# Patient Record
Sex: Male | Born: 1975 | Race: Black or African American | Hispanic: No | Smoking: Never smoker
Health system: Southern US, Community
[De-identification: ages and names within clinical notes are randomized; demographics above are authoritative.]

## PROBLEM LIST (undated history)

## (undated) DIAGNOSIS — I10 Essential (primary) hypertension: Secondary | ICD-10-CM

## (undated) DIAGNOSIS — E119 Type 2 diabetes mellitus without complications: Secondary | ICD-10-CM

## (undated) DIAGNOSIS — F319 Bipolar disorder, unspecified: Secondary | ICD-10-CM

## (undated) HISTORY — PX: KNEE SURGERY: SHX244

## (undated) MED FILL — QUETIAPINE 200 MG TABLET: 200 200 MG | ORAL | 30 days supply | Qty: 30 | Fill #0

## (undated) MED FILL — LAMOTRIGINE 25 MG TABLET: 25 25 MG | ORAL | 14 days supply | Qty: 14 | Fill #0

## (undated) MED FILL — ARIPIPRAZOLE ER 400 MG INTRAMUSCULAR SUSPENSION,EXTENDED RELEASE: 400 400 mg | INTRAMUSCULAR | 30 days supply | Qty: 1 | Fill #0

## (undated) MED FILL — QUETIAPINE 400 MG TABLET: 400 400 MG | ORAL | 30 days supply | Qty: 30 | Fill #0

## (undated) NOTE — Telephone Encounter (Signed)
 Formatting of this note might be different from the original. Information has been submitted to Cover my meds, await response  Per Cover my meds available with out PA.  Please send new rx to [facility] in [place] [address] [facility] Electronically signed by Jilda Sor, LPN at 88/83/7979  9:30 AM EST

## (undated) NOTE — Telephone Encounter (Signed)
 Formatting of this note might be different from the original. Pt called stating his pharmacy informed him methoxsalen (OXSORALEN ULTRA) 10 MG CAPS is not formulary. Pt questions if a new medication can be prescribed.   [phone] (home) [phone] [facility])   [facility] - [place], [place] - [address]  [phone]  Electronically signed by Marea Browning, LPN at 88/86/7979  3:24 PM EST

## (undated) NOTE — Telephone Encounter (Signed)
 Formatting of this note might be different from the original. [phone] (home) [phone] [facility])   Spoke to patient advised did not need a PA, stated he was going to call his pharmacy to find out how much it was and call back. Electronically signed by Jilda Sor, LPN at 88/81/7979  9:00 AM EST

## (undated) NOTE — Addendum Note (Signed)
 Addended by: STUART PROMISE on: 11/27/2018 05:29 PM   Modules accepted: Orders   Electronically signed by STUART PROMISE SAUNDERS., MD at 11/27/2018  5:29 PM EST

## (undated) NOTE — Telephone Encounter (Signed)
 Formatting of this note might be different from the original. Attempted to contact patient, has VM but is full, will try again. Electronically signed by Jilda Sor, LPN at 88/83/7979  4:42 PM EST

## (undated) NOTE — Telephone Encounter (Signed)
 Formatting of this note might be different from the original. Skin Type: V- Brown Skin (Asians, Hispanics, Middle Easterns, light skinned African Americans)  Diagnosis: CTCL  Phototherapy Type: NBUVB/box  Frequency: 3x/week  Specific instructions: Start Increase 154mJ/treament Max dose: 2500mJ  Mineral Oil: No Sunscreen: no  Special Instructions: None Boost Dose: None     Electronically signed by Stuart Connor SAUNDERS., MD at 11/27/2018  5:29 PM EST

## (undated) NOTE — Telephone Encounter (Signed)
 Formatting of this note might be different from the original. Can we try to do PA on this?  He needs to take this in addition to his UVA treatment Electronically signed by Melonie Therisa HERO., CNP at 11/19/2018  9:07 AM EST

## (undated) NOTE — Telephone Encounter (Signed)
 Formatting of this note might be different from the original. [phone] (home) [phone] [facility])   Spoke to patient he is agreable to the UVB light box,  Scheduled for Monday 11/30, please place orders  Release of records form was filled out and sent to medical records for them to get previous records.  Electronically signed by Jilda Sor, LPN at 88/76/7979  1:59 PM EST

## (undated) NOTE — Telephone Encounter (Signed)
 Formatting of this note might be different from the original. [place]. This sounds too expensive to attempt long -term. We should set him up for UVB instead of PUVA then. This means treatments preferably three times a week instead of two. Did we ever get records from his previous providers? That is still needed Electronically signed by Stuart Connor SAUNDERS., MD at 11/26/2018 12:35 PM EST

## (undated) NOTE — Telephone Encounter (Signed)
 Formatting of this note might be different from the original. Patient calling stating that the medication is $271 for a four day supply. This is the price with his insurance. Please advise Electronically signed by Jolee Fruits, LPN at 88/81/7979  9:22 AM EST

---

## 1997-10-23 ENCOUNTER — Encounter: Payer: Self-pay | Admitting: Emergency Medicine

## 1997-10-23 ENCOUNTER — Emergency Department (HOSPITAL_COMMUNITY): Admission: EM | Admit: 1997-10-23 | Discharge: 1997-10-23 | Payer: Self-pay | Admitting: Emergency Medicine

## 1998-03-04 ENCOUNTER — Ambulatory Visit (HOSPITAL_COMMUNITY): Admission: RE | Admit: 1998-03-04 | Discharge: 1998-03-04 | Payer: Self-pay | Admitting: Infectious Diseases

## 1998-03-04 ENCOUNTER — Encounter: Payer: Self-pay | Admitting: Infectious Diseases

## 1998-09-23 ENCOUNTER — Ambulatory Visit (HOSPITAL_COMMUNITY): Admission: RE | Admit: 1998-09-23 | Discharge: 1998-09-23 | Payer: Self-pay | Admitting: Psychiatry

## 1998-10-29 ENCOUNTER — Encounter: Admission: RE | Admit: 1998-10-29 | Discharge: 1998-10-29 | Payer: Self-pay | Admitting: Sports Medicine

## 1998-11-16 ENCOUNTER — Ambulatory Visit (HOSPITAL_BASED_OUTPATIENT_CLINIC_OR_DEPARTMENT_OTHER): Admission: RE | Admit: 1998-11-16 | Discharge: 1998-11-16 | Payer: Self-pay

## 1999-01-29 ENCOUNTER — Encounter: Admission: RE | Admit: 1999-01-29 | Discharge: 1999-01-29 | Payer: Self-pay | Admitting: Family Medicine

## 1999-02-21 ENCOUNTER — Emergency Department (HOSPITAL_COMMUNITY): Admission: EM | Admit: 1999-02-21 | Discharge: 1999-02-21 | Payer: Self-pay | Admitting: Emergency Medicine

## 1999-02-21 ENCOUNTER — Encounter: Payer: Self-pay | Admitting: Emergency Medicine

## 1999-02-25 ENCOUNTER — Encounter: Admission: RE | Admit: 1999-02-25 | Discharge: 1999-02-25 | Payer: Self-pay | Admitting: Sports Medicine

## 1999-03-03 ENCOUNTER — Ambulatory Visit (HOSPITAL_BASED_OUTPATIENT_CLINIC_OR_DEPARTMENT_OTHER): Admission: RE | Admit: 1999-03-03 | Discharge: 1999-03-04 | Payer: Self-pay | Admitting: Orthopedic Surgery

## 1999-03-16 ENCOUNTER — Encounter: Admission: RE | Admit: 1999-03-16 | Discharge: 1999-06-14 | Payer: Self-pay | Admitting: Orthopedic Surgery

## 1999-05-28 ENCOUNTER — Encounter: Admission: RE | Admit: 1999-05-28 | Discharge: 1999-05-28 | Payer: Self-pay | Admitting: Family Medicine

## 1999-11-14 ENCOUNTER — Inpatient Hospital Stay (HOSPITAL_COMMUNITY): Admission: EM | Admit: 1999-11-14 | Discharge: 1999-11-15 | Payer: Self-pay | Admitting: Emergency Medicine

## 1999-11-15 ENCOUNTER — Inpatient Hospital Stay (HOSPITAL_COMMUNITY): Admission: EM | Admit: 1999-11-15 | Discharge: 1999-11-23 | Payer: Self-pay | Admitting: Psychiatry

## 1999-11-24 ENCOUNTER — Other Ambulatory Visit (HOSPITAL_COMMUNITY): Admission: RE | Admit: 1999-11-24 | Discharge: 1999-12-16 | Payer: Self-pay | Admitting: Psychiatry

## 2000-01-27 ENCOUNTER — Encounter: Admission: RE | Admit: 2000-01-27 | Discharge: 2000-01-27 | Payer: Self-pay | Admitting: Sports Medicine

## 2011-01-04 ENCOUNTER — Other Ambulatory Visit: Payer: Self-pay | Admitting: Oncology

## 2017-08-22 ENCOUNTER — Emergency Department (HOSPITAL_COMMUNITY)
Admission: EM | Admit: 2017-08-22 | Discharge: 2017-08-23 | Disposition: A | Discharge status: Home / Self Care | Payer: Self-pay | Attending: Emergency Medicine | Admitting: Emergency Medicine

## 2017-08-22 ENCOUNTER — Encounter (HOSPITAL_COMMUNITY): Payer: Self-pay

## 2017-08-22 ENCOUNTER — Ambulatory Visit (HOSPITAL_COMMUNITY)
Admission: EM | Admit: 2017-08-22 | Discharge: 2017-08-22 | Disposition: A | Discharge status: Home / Self Care | Payer: Self-pay | Attending: Psychiatry | Admitting: Psychiatry

## 2017-08-22 ENCOUNTER — Other Ambulatory Visit: Payer: Self-pay

## 2017-08-22 DIAGNOSIS — F312 Bipolar disorder, current episode manic severe with psychotic features: Secondary | ICD-10-CM | POA: Insufficient documentation

## 2017-08-22 DIAGNOSIS — Z046 Encounter for general psychiatric examination, requested by authority: Secondary | ICD-10-CM | POA: Insufficient documentation

## 2017-08-22 DIAGNOSIS — F319 Bipolar disorder, unspecified: Secondary | ICD-10-CM | POA: Diagnosis present

## 2017-08-22 DIAGNOSIS — Z79899 Other long term (current) drug therapy: Secondary | ICD-10-CM | POA: Insufficient documentation

## 2017-08-22 DIAGNOSIS — R45850 Homicidal ideations: Secondary | ICD-10-CM | POA: Insufficient documentation

## 2017-08-22 LAB — CBC WITH DIFFERENTIAL/PLATELET
BASOS ABS: 0 10*3/uL (ref 0.0–0.1)
BASOS PCT: 0 %
EOS PCT: 0 %
Eosinophils Absolute: 0 10*3/uL (ref 0.0–0.7)
HCT: 40 % (ref 39.0–52.0)
Hemoglobin: 13.8 g/dL (ref 13.0–17.0)
Lymphocytes Relative: 21 %
Lymphs Abs: 2.1 10*3/uL (ref 0.7–4.0)
MCH: 27.1 pg (ref 26.0–34.0)
MCHC: 34.5 g/dL (ref 30.0–36.0)
MCV: 78.4 fL (ref 78.0–100.0)
MONO ABS: 0.9 10*3/uL (ref 0.1–1.0)
Monocytes Relative: 9 %
NEUTROS ABS: 6.8 10*3/uL (ref 1.7–7.7)
Neutrophils Relative %: 70 %
PLATELETS: 212 10*3/uL (ref 150–400)
RBC: 5.1 MIL/uL (ref 4.22–5.81)
RDW: 12.9 % (ref 11.5–15.5)
WBC: 9.8 10*3/uL (ref 4.0–10.5)

## 2017-08-22 LAB — COMPREHENSIVE METABOLIC PANEL
ALT: 30 U/L (ref 0–44)
ANION GAP: 15 (ref 5–15)
AST: 55 U/L — ABNORMAL HIGH (ref 15–41)
Albumin: 5.1 g/dL — ABNORMAL HIGH (ref 3.5–5.0)
Alkaline Phosphatase: 77 U/L (ref 38–126)
BUN: 18 mg/dL (ref 6–20)
CALCIUM: 10 mg/dL (ref 8.9–10.3)
CHLORIDE: 95 mmol/L — AB (ref 98–111)
CO2: 22 mmol/L (ref 22–32)
CREATININE: 1.33 mg/dL — AB (ref 0.61–1.24)
Glucose, Bld: 108 mg/dL — ABNORMAL HIGH (ref 70–99)
Potassium: 3.8 mmol/L (ref 3.5–5.1)
Sodium: 132 mmol/L — ABNORMAL LOW (ref 135–145)
Total Bilirubin: 1.2 mg/dL (ref 0.3–1.2)
Total Protein: 8.6 g/dL — ABNORMAL HIGH (ref 6.5–8.1)

## 2017-08-22 LAB — URINALYSIS, ROUTINE W REFLEX MICROSCOPIC
Bilirubin Urine: NEGATIVE
Glucose, UA: NEGATIVE mg/dL
Hgb urine dipstick: NEGATIVE
KETONES UR: NEGATIVE mg/dL
LEUKOCYTES UA: NEGATIVE
NITRITE: NEGATIVE
PROTEIN: NEGATIVE mg/dL
Specific Gravity, Urine: 1.003 — ABNORMAL LOW (ref 1.005–1.030)
pH: 6 (ref 5.0–8.0)

## 2017-08-22 LAB — RAPID URINE DRUG SCREEN, HOSP PERFORMED
Amphetamines: NOT DETECTED
Barbiturates: NOT DETECTED
Benzodiazepines: NOT DETECTED
COCAINE: NOT DETECTED
Tetrahydrocannabinol: NOT DETECTED

## 2017-08-22 LAB — ETHANOL

## 2017-08-22 LAB — ACETAMINOPHEN LEVEL

## 2017-08-22 MED ORDER — LORAZEPAM 2 MG/ML IJ SOLN
2.0000 mg | INTRAMUSCULAR | Status: DC | PRN
  Administered 2017-08-22: 2 mg via INTRAVENOUS
  Filled 2017-08-22: qty 1

## 2017-08-22 MED ORDER — ACETAMINOPHEN 325 MG PO TABS: 650.0000 mg | ORAL_TABLET | ORAL | Status: DC | PRN

## 2017-08-22 MED ORDER — LORAZEPAM 2 MG/ML IJ SOLN
2.0000 mg | Freq: Once | INTRAMUSCULAR | Status: AC
  Administered 2017-08-22: 2 mg via INTRAVENOUS
  Filled 2017-08-22: qty 1

## 2017-08-22 MED ORDER — STERILE WATER FOR INJECTION IJ SOLN
INTRAMUSCULAR | Status: AC
  Filled 2017-08-22: qty 10

## 2017-08-22 MED ORDER — ZIPRASIDONE HCL 20 MG PO CAPS: 60.0000 mg | ORAL_CAPSULE | Freq: Every day | ORAL | Status: DC

## 2017-08-22 MED ORDER — OLANZAPINE 10 MG IM SOLR
10.0000 mg | Freq: Two times a day (BID) | INTRAMUSCULAR | Status: DC | PRN
  Administered 2017-08-22: 10 mg via INTRAMUSCULAR
  Filled 2017-08-22: qty 10

## 2017-08-22 MED ORDER — PRAVASTATIN SODIUM 20 MG PO TABS
80.0000 mg | ORAL_TABLET | Freq: Every day | ORAL | Status: DC
  Administered 2017-08-23: 80 mg via ORAL
  Filled 2017-08-22: qty 4

## 2017-08-22 MED ORDER — OLANZAPINE 5 MG PO TBDP
5.0000 mg | ORAL_TABLET | Freq: Every day | ORAL | Status: DC
  Administered 2017-08-22 – 2017-08-23 (×2): 5 mg via ORAL
  Filled 2017-08-22 (×2): qty 1

## 2017-08-22 MED ORDER — TRAZODONE HCL 100 MG PO TABS
100.0000 mg | ORAL_TABLET | Freq: Every day | ORAL | Status: DC
  Administered 2017-08-22: 100 mg via ORAL
  Filled 2017-08-22: qty 1

## 2017-08-22 MED ORDER — ZIPRASIDONE MESYLATE 20 MG IM SOLR
20.0000 mg | Freq: Once | INTRAMUSCULAR | Status: AC
  Administered 2017-08-22: 20 mg via INTRAMUSCULAR
  Filled 2017-08-22: qty 20

## 2017-08-22 MED ORDER — DIPHENHYDRAMINE HCL 50 MG/ML IJ SOLN
50.0000 mg | Freq: Four times a day (QID) | INTRAMUSCULAR | Status: DC | PRN
  Administered 2017-08-22: 50 mg via INTRAMUSCULAR
  Filled 2017-08-22: qty 1

## 2017-08-22 MED ORDER — DIPHENHYDRAMINE HCL 25 MG PO CAPS: 50.0000 mg | ORAL_CAPSULE | Freq: Four times a day (QID) | ORAL | Status: DC | PRN

## 2017-08-22 MED ORDER — DIPHENHYDRAMINE HCL 25 MG PO CAPS
50.0000 mg | ORAL_CAPSULE | Freq: Four times a day (QID) | ORAL | Status: DC | PRN
  Administered 2017-08-22: 50 mg via ORAL
  Filled 2017-08-22: qty 2

## 2017-08-22 MED ORDER — AMLODIPINE BESYLATE 5 MG PO TABS
10.0000 mg | ORAL_TABLET | Freq: Every day | ORAL | Status: DC
  Administered 2017-08-23: 10 mg via ORAL
  Filled 2017-08-22: qty 2

## 2017-08-22 MED ORDER — LORAZEPAM 2 MG/ML IJ SOLN
2.0000 mg | Freq: Once | INTRAMUSCULAR | Status: AC
  Administered 2017-08-22: 2 mg via INTRAMUSCULAR
  Filled 2017-08-22: qty 1

## 2017-08-22 NOTE — BH Assessment (Signed)
[facility] Assessment Progress Note  TTS consult entered by EDP Pfeiffer. Upon clinician going to assess pt, it is noted that pt is sedated and flanked by 6-8 tech and LE personnel attempting to rouse and prepare pt to be transferred to a room in the secured ED area. Clock stopped on assessment as pt is unable to participate at this time.   Johny ShockSamantha M. Ladona Ridgelaylor, MS, NCC, LPCA Counselor

## 2017-08-22 NOTE — BH Assessment (Signed)
Chris Rogers called to speak to a nurse about the pt. She left a message for someone to call her back at [phone]

## 2017-08-22 NOTE — BH Assessment (Signed)
[facility]. Patient acutely psychotic and manic in the lobby, unable to fill out form. Patient became acutely agitated within seconds of arriving into the lobby and [employer] was called. The patient had undressed completely in the lobby, was naked and family was trying but unable to redirect patient. Patient stated '' 'I need a bible, I am the children's prophet. Give me a bible'' Patient then stated he was '' Going to kick all the white people's ass, I hate trump, I'm going to kill him and I'm going to kick ass'' Patient in acute emergency displaying manic pressured speech, hyper-religious thinking, labile mood and unable to be safely assessed. Patient jumping up and down, then slamming fists onto chairs and desk. Patient was threatening to staff and family member, posturing, and trying to hit his family member in the lobby. EMS 911 dispatch called, and relayed above information. Officers en route. Spoke with patient further, attempting to explain process of emergency treatment, patient states '' I haven't slept in five days, I'm off all my meds, geodon , depakote and lamictal. '' '' Please give me a shot, knock me out I haven't slept in five days and I need something like haldol or whatever I don't care what you give me just help me sleep! Trazodone and Ativan don't work'' ''I've been getting like this for a month, but then my wife is leaving me because I'm bipolar!'' [employer] to scene and then patient became acutely paranoid, shoving and trying to swing at family member to charge at officers. Officers attempted to redirect patient however pt unable to be verbally redirected. Officers tazed patient in the lobby in attempting to maintain safety, patient continued to try to get up, so he was tazed second time. Patient was heard continuing to threaten officers while being tazed ''I'm gonna get your for that'' Notified CN of above situation as patient to be transported emergently to ED  for treatment.  MD made aware and emergency certificate completed with exam and petition. Faxed to [facility] [employer] and original copy handed to officer. Family updated regarding plan of care. EMS to scene and patient [facility] [employer] [facility] [facility].

## 2017-08-22 NOTE — ED Triage Notes (Addendum)
[facility] in custody [facility], acting in an aggressive manor, shouting and threatening, agitated, was tasered by [employer] twice. Needs Medical Clearance prior to transport to detention.

## 2017-08-22 NOTE — BH Assessment (Addendum)
Pt asked staff if they could have sex together. Pt told pt that the conversation was inappropriate, he continued to be sexually explicit with staff, stating "I like to come sex is nothing, I just like to come".

## 2017-08-22 NOTE — ED Provider Notes (Signed)
[facility]-EMERGENCY DEPT Provider Note   CSN: [id number] Arrival date & time: 08/22/17  0919     History   Chief Complaint Chief Complaint  Patient presents with  . Medical Clearance    HPI Chris Rogers is a 42 y.o. male.  HPI Presented to behavioral health facility this morning voluntarily but aggressively yelling and threatening people.  He reported he is going to kill people.  He was sent to the emergency department via police under involuntary commitment status.  Patient's interaction with me was labile.  At first he was gregarious and interacting with pressured speech.  He stated that he was "really crazy".  He stated he needed a lot of help and whole bottle of sedatives.  He then became distracted by his 2 sisters who were in the room trying to add history.  He began aggressively reprimanding them for interacting in the encounter.  When I tried to redirect him, he turned to me and threateningly advised me not to interrupt him.  He then began escalating increasingly and despite being handcuffed to the bed with both arms started to project himself forward off the stretcher such that his feet were on the ground and appeared possibly capable of flipping the stretcher from the floor.  Patient's sister advises that he has been noncompliant with medications for bipolar disorder for 2 days.  She reports that this is triggered by his wife having just recently told him that she was leaving him.  Patient had endorsed schizophrenia but his sister reports this is not true.  Patient endorsed having hypertension but stated that he never takes medications for it because that's BS.  Patient denies any alcohol use. No past medical history on file.  There are no active problems to display for this patient.       Home Medications    Prior to Admission medications   Medication Sig Start Date End Date Taking? Authorizing Provider  amLODipine (NORVASC) 10 MG tablet Take 10 mg by  mouth daily.   Yes [provider]  clobetasol ointment (TEMOVATE) 0.05 % Apply 1 application topically 2 (two) times daily.   Yes [provider]  doxazosin (CARDURA) 8 MG tablet Take 8 mg by mouth daily.   Yes [provider]  ferrous sulfate (FER-IN-SOL) 75 (15 Fe) MG/ML SOLN Take 15 mg of iron by mouth daily.   Yes [provider]  ferrous sulfate 325 (65 FE) MG EC tablet Take 325 mg by mouth 3 (three) times daily with meals.   Yes [provider]  folic acid (FOLVITE) 1 MG tablet Take 1 mg by mouth daily.   Yes [provider]  GLUCOSAMINE-CHONDROITIN PO Take by mouth.   Yes [provider]  lamoTRIgine (LAMICTAL) 100 MG tablet Take 100 mg by mouth daily.   Yes [provider]  meloxicam (MOBIC) 15 MG tablet Take 15 mg by mouth daily.   Yes [provider]  methotrexate (RHEUMATREX) 2.5 MG tablet Take 2.5 mg by mouth once a week. Caution:Chemotherapy. Protect from light.   Yes [provider]  Multiple Vitamins-Minerals (MULTIVITAMIN ADULT PO) Take by mouth.   Yes [provider]  pravastatin (PRAVACHOL) 80 MG tablet Take 80 mg by mouth daily.   Yes [provider]  PRESCRIPTION MEDICATION Urinozinc Prostate Formula 100mg    Yes [provider]  tiZANidine (ZANAFLEX) 2 MG tablet Take by mouth every 6 (six) hours as needed for muscle spasms.   Yes [provider]  traZODone (DESYREL) 100 MG tablet Take 100 mg by mouth at bedtime.   Yes [provider]  triamcinolone ointment (KENALOG) 0.1 % Apply 1 application topically 2 (two) times daily.   Yes [provider]  ziprasidone (GEODON) 60 MG capsule Take 60 mg by mouth at bedtime. 08/10/17  Yes [provider]    Family History No family history on file.  Social History Social History   Tobacco Use  . Smoking status: Never Smoker  . Smokeless tobacco: Never Used  Substance Use Topics  .  Alcohol use: Not on file  . Drug use: Not on file     Allergies   Patient has no known allergies.   Review of Systems Review of Systems Level 5 caveat cannot obtain reliable review of systems at this time due to patient's degree of agitation and psychosis.  Physical Exam Updated Vital Signs BP 128/77   Pulse (!) 102   Temp 99 F (37.2 C)   Resp 20   Ht 5\' 9"  (1.753 m)   Wt 122.5 kg   SpO2 92%   BMI 39.87 kg/m   Physical Exam  Constitutional:  Patient is alert and having pressured speech.  He is sitting upright in the stretcher with both hands cuffed to the side rails.  No respiratory distress.  HENT:  Head: Normocephalic and atraumatic.  Airway patent  Eyes: EOM are normal.  Neck: Neck supple.  Cardiovascular: Normal rate, regular rhythm, normal heart sounds and intact distal pulses.  Pulmonary/Chest: Effort normal and breath sounds normal.  Abdominal: Soft. He exhibits no distension. There is no tenderness. There is no guarding.  Musculoskeletal: Normal range of motion. He exhibits no edema, tenderness or deformity.  Neurological: He is alert. He exhibits normal muscle tone. Coordination normal.  Skin: Skin is warm and dry.  Psychiatric:  On arrival patient is extremely agitated and combative.  He is yelling and threatening in behavior.  Speech is pressured.  Syntax is appropriate.  He vacillates rapidly between gregarious and threatening.     ED Treatments / Results  Labs (all labs ordered are listed, but only abnormal results are displayed) Labs Reviewed  COMPREHENSIVE METABOLIC PANEL - Abnormal; Notable for the following components:      Result Value   Sodium 132 (*)    Chloride 95 (*)    Glucose, Bld 108 (*)    Creatinine, Ser 1.33 (*)    Total Protein 8.6 (*)    Albumin 5.1 (*)    AST 55 (*)    All other components within normal limits  ACETAMINOPHEN LEVEL - Abnormal; Notable for the following components:   Acetaminophen (Tylenol), Serum <10 (*)     All other components within normal limits  URINALYSIS, ROUTINE W REFLEX MICROSCOPIC - Abnormal; Notable for the following components:   Color, Urine STRAW (*)    Specific Gravity, Urine 1.003 (*)    All other components within normal limits  RAPID URINE DRUG SCREEN, HOSP PERFORMED - Abnormal; Notable for the following components:   Opiates   (*)    Value: Result not available. Reagent lot number recalled by manufacturer.   All other components within normal limits  ETHANOL  CBC WITH DIFFERENTIAL/PLATELET    EKG EKG Interpretation  Date/Time:  Tuesday August 22 2017 10:55:14 EDT Ventricular Rate:  104 PR Interval:    QRS Duration: 110 QT Interval:  324 QTC Calculation: 427 R Axis:   15 Text Interpretation:  Sinus tachycardia Low voltage,  precordial leads ST elev, probable normal early repol pattern agree. similar to previous with rate related changes. Confirmed by Arby BarrettePfeiffer, Demarcus Thielke [phone]([id number]) on 08/22/2017 11:20:29 AM   Radiology No results found.  Procedures Procedures (including critical care time) CRITICAL CARE Performed by: Arby BarretteMarcy Wilfred Dayrit   Total critical care time: 45 minutes  Critical care time was exclusive of separately billable procedures and treating other patients.  Critical care was necessary to treat or prevent imminent or life-threatening deterioration.  Critical care was time spent personally by me on the following activities: development of treatment plan with patient and/or surrogate as well as nursing, discussions with consultants, evaluation of patient's response to treatment, examination of patient, obtaining history from patient or surrogate, ordering and performing treatments and interventions, ordering and review of laboratory studies, ordering and review of radiographic studies, pulse oximetry and re-evaluation of patient's condition. Medications Ordered in ED Medications  acetaminophen (TYLENOL) tablet 650 mg (has no administration in time range)    LORazepam (ATIVAN) injection 2 mg (has no administration in time range)  amLODipine (NORVASC) tablet 10 mg (has no administration in time range)  traZODone (DESYREL) tablet 100 mg (has no administration in time range)  ziprasidone (GEODON) capsule 60 mg (has no administration in time range)  pravastatin (PRAVACHOL) tablet 80 mg (has no administration in time range)  ziprasidone (GEODON) injection 20 mg (20 mg Intramuscular Given 08/22/17 0950)  LORazepam (ATIVAN) injection 2 mg (2 mg Intramuscular Given 08/22/17 1030)  LORazepam (ATIVAN) injection 2 mg (2 mg Intravenous Given 08/22/17 1135)     Initial Impression / Assessment and Plan / ED Course  I have reviewed the triage vital signs and the nursing notes.  Pertinent labs & imaging results that were available during my care of the patient were reviewed by me and considered in my medical decision making (see chart for details).    11: 00 patient is more relaxed.  He is sitting in the stretcher talking to his sisters.  His speech is still pressured and he is alert in appearance but he is now Estate agentpleasantly interactive.  Patient however is not showing signs of sedation, will administer Ativan 2 more milligrams IM.  This is 30 minutes from the last dose.  Final Clinical Impressions(s) / ED Diagnoses   Final diagnoses:  Bipolar affective disorder, currently manic, severe, with psychotic features [facility])   Patient is brought in with active psychosis.  He does have known history of bipolar disorder.  Sisters report he has not slept for 2 to 3 days.  He has had a recent stressor in that his wife as told him that she is leaving him.  He reportedly has not taken his medications for several days as well.  He required sedation for management.  Patient was physically threatening in appearance and making verbal threats.  He was given Geodon 20 mg IM.  Reassessment at 30 minutes did not show significant decrease in agitation.  He was then given Ativan 2 mg IM.   Reassessment in 30 minutes showed him to be more calm and pleasantly interactive than previously.  Still hypervigilant and very alert.  He was given additional 2 mg of Ativan IM.  I have reassessed him and now he is sleeping quietly with stable vital signs.  Patient is currently medically cleared for psychiatric evaluation for decompensated bipolar disorder with acute psychosis. ED Discharge Orders    None       Arby BarrettePfeiffer, Zhara Gieske, MD 08/22/17 1251

## 2017-08-22 NOTE — ED Notes (Signed)
On admission to the Acute Unit pt was able to roll himself off of the stretcher and onto his bed. He had medications while in the emergency department to help him calm down. Bed rails up on bed for safety at this time, low position.

## 2017-08-22 NOTE — ED Notes (Signed)
Bed: [id number] Expected date:  Expected time:  Means of arrival:  [facility]

## 2017-08-22 NOTE — ED Notes (Signed)
While in the Acute Unit pt was a fall risk. He had frequent urination and sometimes was not able to urinate. Pt kept going down the hall to go to the bathroom and was staggering and slipping on the floor and reported feeling light headed.He was restless and unable to sit still, even after all the medication he received.  He was sexually inappropriate to the available sitter in the Acute Unit, so pt was moved to the TCU for safety where he has a room with a bathroom in the room. Pt gave consent in writing for staff to talk with family members. His sister Ivor ReiningHattela was called and told about the move for safety and to prevent falls and she was grateful. Pt was not unpleasant with this writer, but he was tangential, delusional and had flight of ideas and was difficult to redirect. He did not endorse SI/HI.

## 2017-08-22 NOTE — BH Assessment (Signed)
Assessment Note  Chris BealsBennie Rogers is a 42 y.o. male who [name], [facility] by [name], where pt initially [facility]. [name], pt was acutely psychotic and manic, stripping his clothes off in the waiting area and being verbally aggressive,as well as posturing against his family members. Police were called and, due to pt's combativeness, they had to taze him twice.  Pt was in the ED under 4 point restraints and then chemically restrained. Pt had been awake @ 30 minutes when TTS assessment conducted.   During TTS assessment, pt presented as labile, tangential, and distractible. He proved to be a poor historian. He reported being in the hospital due to being "bipolar since 1993". Pt was unable to clearly explain the specific reason for him being in the hospital. Pt reported SI "all the time", as well as HI towards [employer] Trump. Pt reports having many suicide attempts.  Pt additionally reported that he "used to" take medications, but he feels the meds slow him down and cause his hair to fall out. Pt reports that he last took his meds on 8/18th.   Case staffed with [name], NP, and it was agreed that pt need IP psychiatric hospitalization for crisis stabilization.   Diagnosis: F31.2 Bipolar I d/o, current episode manic severe w/ psychotic features  Past Medical History: No past medical history on file.  Family History: No family history on file.  Social History:  reports that he has never smoked. He has never used smokeless tobacco. His alcohol and drug histories are not on file.  Additional Social History:  Alcohol / Drug Use Pain Medications: see MAR Prescriptions: see MAR Over the Counter: see MAR History of alcohol / drug use?: No history of alcohol / drug abuse(Pt tests positive for all substances.)  CIWA: CIWA-Ar BP: 128/77 Pulse Rate: (!) 102 COWS:    Allergies: No Known Allergies  Home Medications:  (Not in a hospital admission)  OB/GYN Status:   No LMP for male patient.  General Assessment Data Assessment unable to be completed: Yes Reason for not completing assessment: pt is sedated Location of Assessment: [facility] TTS Assessment: In system Is this a Tele or Face-to-Face Assessment?: Face-to-Face Is this an Initial Assessment or a Re-assessment for this encounter?: Initial Assessment Marital status: Married Admission Status: Involuntary Is patient capable of signing voluntary admission?: No Referral Source: Self/Family/Friend     Crisis Care Plan Name of Psychiatrist: unknown Name of Therapist: unknown  Education Status Is patient currently in school?: No  Risk to self with the past 6 months Suicidal Ideation: Yes-Currently Present Suicidal Plan?: No Has patient had any suicidal plan within the past 6 months prior to admission? : No Previous Attempts/Gestures: Yes Triggers for Past Attempts: Unknown Family Suicide History: Unknown Recent stressful life event(s): Conflict (Comment) Persecutory voices/beliefs?: No Substance abuse history and/or treatment for substance abuse?: No Suicide prevention information given to non-admitted patients: Not applicable  Risk to Others within the past 6 months Homicidal Ideation: Yes-Currently Present Does patient have any lifetime risk of violence toward others beyond the six months prior to admission? : Yes (comment) Thoughts of Harm to Others: Yes-Currently Present Comment - Thoughts of Harm to Others: patient has been very combative while in ED Current Homicidal Intent: No Current Homicidal Plan: No Access to Homicidal Means: No Identified Victim: [name] Assessment of Violence: On admission Violent Behavior Description: Pt was very combative  Does patient have access to weapons?: No Criminal Charges  Pending?: No Does patient have a court date: No Is patient on probation?: No  Psychosis Hallucinations: None noted Delusions: Unspecified  Mental Status  Report Appearance/Hygiene: In scrubs Eye Contact: Good Motor Activity: Agitation, Hyperactivity Speech: Pressured Level of Consciousness: Alert Mood: Labile Affect: Appropriate to circumstance Anxiety Level: None Thought Processes: Tangential Judgement: Impaired Orientation: Person, Place Obsessive Compulsive Thoughts/Behaviors: Unable to Assess  Cognitive Functioning Concentration: Decreased Memory: Recent Impaired, Remote Impaired Is patient IDD: No Is patient DD?: No Insight: Poor Impulse Control: Poor Appetite: Fair Have you had any weight changes? : No Change Sleep: Decreased Vegetative Symptoms: Unable to Assess  ADLScreening [facility] Patient's cognitive ability adequate to safely complete daily activities?: Yes Patient able to express need for assistance with ADLs?: Yes Independently performs ADLs?: Yes (appropriate for developmental age)  Prior Inpatient Therapy Prior Inpatient Therapy: Yes Prior Therapy Dates: pt reports multiple times Prior Therapy Facilty/Provider(s): multiple facilities Reason for Treatment: bipolar  Prior Outpatient Therapy Prior Outpatient Therapy: Yes Prior Therapy Facilty/Provider(s): unknown Reason for Treatment: bipolar Does patient have an ACCT team?: Unknown Does patient have Intensive In-House Services?  : No Does patient have [facility] services? : Unknown Does patient have P4CC services?: No  ADL Screening (condition at time of admission) Patient's cognitive ability adequate to safely complete daily activities?: Yes Is the patient deaf or have difficulty hearing?: No Does the patient have difficulty seeing, even when wearing glasses/contacts?: No Does the patient have difficulty concentrating, remembering, or making decisions?: No Patient able to express need for assistance with ADLs?: Yes Does the patient have difficulty dressing or bathing?: No Independently performs ADLs?: Yes (appropriate for developmental  age) Does the patient have difficulty walking or climbing stairs?: No Weakness of Legs: None Weakness of Arms/Hands: None  Home Assistive Devices/Equipment Home Assistive Devices/Equipment: None    Abuse/Neglect Assessment (Assessment to be complete while patient is alone) Abuse/Neglect Assessment Can Be Completed: Unable to assess, patient is non-responsive or altered mental status     Advance Directives (For Healthcare) Does Patient Have a Medical Advance Directive?: No Would patient like information on creating a medical advance directive?: No - Patient declined Nutrition Screen- [facility] Patient's home diet: Regular        Disposition:  Disposition Initial Assessment Completed for this Encounter: Yes  On Site Evaluation by:   Reviewed with Physician:    Laddie AquasSamantha M Kaan Tosh 08/22/2017 3:20 PM

## 2017-08-22 NOTE — ED Notes (Signed)
Patient ran up pushed on lock doors and started cursing. Patient escorted to his room by Security and [employer]. [facility] and safety maintain. Q 15 min safety checks remain in place and video monitoring.

## 2017-08-22 NOTE — ED Notes (Addendum)
Pt states to this RN "my dick is not big enough to pleasure you, but I want to fuck the shit out of you." Pt asking names repeatedly. Pt also stating that he wants to "fucking kill [name]"

## 2017-08-22 NOTE — ED Notes (Signed)
The sitter was attempting to update patients vital signs. I observed patient walking from the bed to the toilet. He bent down and placed his head in the toilet. He stood up and stated "the toilet smelled like shit". He washed his face with soap. Patient remained clam through the whole incident. Dr. Rubin PayorPickering has been made aware of patient sticking his head in the toilet via Misty StanleyLisa, MUS. I have requested through Misty StanleyLisa, MUS for Dr. Rubin PayorPickering to order medication for him being anxious. While typing this letter, he washed his socks in the sink. Since, being back from TCU, patient is more steady on his feet.

## 2017-08-22 NOTE — ED Notes (Signed)
Bed: WA28 Expected date:  Expected time:  Means of arrival:  Comments: 

## 2017-08-22 NOTE — BH Assessment (Signed)
[facility] Assessment Progress Note  Per Jolyne Loahristopher Rain[place], MD, this pt requires psychiatric hospitalization at this time.  Pt also finds that pt meets criteria for IVC, which he has initiated.  The following facilities have been contacted to seek placement for this pt, with results as noted:  Beds available, information sent, decision pending:  [facility]   [place] R[place]lee [place], [place] [employer]al Health Coordinator [phone]

## 2017-08-22 NOTE — ED Notes (Signed)
Pt in bed snoring.

## 2017-08-22 NOTE — ED Notes (Signed)
Pt arrived and remained in handcuffs due to agitation and continued aggressive behavior and verbal threatening insinuations.

## 2017-08-22 NOTE — ED Notes (Signed)
When patient came into room 28, went to the restroom. He urinated. Went to the sick and started drinking water from the facet. He was provided 3 cups of water so that he does not have to drink water from the sink. During assessing patient, he experienced flight of ideas about his orthopedic doctor/ knee, going to church, and going through a divorce. Patient is intermittently unsteady at times.

## 2017-08-22 NOTE — ED Notes (Signed)
pts sister phones. Upset that patient was moved into the secure unit.  Caller doesn't seem to understand psych safety procedures.  She said she is calling the Board of Nursing to report the daytime nurses.

## 2017-08-23 DIAGNOSIS — F312 Bipolar disorder, current episode manic severe with psychotic features: Secondary | ICD-10-CM

## 2017-08-23 DIAGNOSIS — F319 Bipolar disorder, unspecified: Secondary | ICD-10-CM | POA: Diagnosis present

## 2017-08-23 MED ORDER — LORAZEPAM 2 MG/ML IJ SOLN
2.0000 mg | INTRAMUSCULAR | Status: DC | PRN
  Administered 2017-08-23: 2 mg via INTRAMUSCULAR
  Filled 2017-08-23: qty 1

## 2017-08-23 NOTE — ED Notes (Signed)
Patient reports visual hallucination and states that he needs to help with them. Patient will be medicated per MAR. Encouragement and support provided and safety maintain. Q 15 min safety checks remain in place and video monitoring.

## 2017-08-23 NOTE — ED Notes (Signed)
Patient stood in front of nurses station and took off all his clothes and stated "I'm going to [employer]". Writer requested that patient put his clothes back on and informed him that he could not leave and go to [employer]. Patient put clothes back on. Encouragement and support provided and safety maintain. Q 15 min safety checks remain in place and video monitoring.

## 2017-08-23 NOTE — Consult Note (Signed)
[facility] Discharge  08/23/2017 10:38 AM Chris BealsBennie Rogers  MRN:  [id number] Principal Problem: Affective psychosis, bipolar [facility](HCC) Discharge Diagnoses: There are no active problems to display for this patient.   Subjective:  Mr. Chris Rogers reports that he is doing better. He reports sleeping poorly overnight. He recalls the events from yesterday. He reports that he was angry with the police officers. He received several behavioral medications overnight for agitation. He reports that he has an [facility].   Total Time spent with patient: 30 minutes  Past Psychiatric History: Bipolar disorder   Past Medical History: No past medical history on file. The histories are not reviewed yet. Please review them in the "History" navigator section and refresh this SmartLink. Family History: No family history on file. Family Psychiatric  History: Unknown  Social History:  Social History   Substance and Sexual Activity  Alcohol Use Not on file    Social History   Substance and Sexual Activity  Drug Use Not on file   Social History   Socioeconomic History  . Marital status: Single    Spouse name: Not on file  . Number of children: Not on file  . Years of education: Not on file  . Highest education level: Not on file  Occupational History  . Not on file  Social Needs  . Financial resource strain: Not on file  . Food insecurity:    Worry: Not on file    Inability: Not on file  . Transportation needs:    Medical: Not on file    Non-medical: Not on file  Tobacco Use  . Smoking status: Never Smoker  . Smokeless tobacco: Never Used  Substance and Sexual Activity  . Alcohol use: Not on file  . Drug use: Not on file  . Sexual activity: Not on file  Lifestyle  . Physical activity:    Days per week: Not on file    Minutes per session: Not on file  . Stress: Not on file  Relationships  . Social connections:    Talks on phone: Not on file    Gets together:  Not on file    Attends religious service: Not on file    Active member of club or organization: Not on file    Attends meetings of clubs or organizations: Not on file    Relationship status: Not on file  Other Topics Concern  . Not on file  Social History Narrative  . Not on file    Has this patient used any form of tobacco in the last 30 days? (Cigarettes, Smokeless Tobacco, Cigars, and/or Pipes) Prescription not provided because: N/A  Current Medications: Current Facility-Administered Medications  Medication Dose Route Frequency Provider Last Rate Last Dose  . acetaminophen (TYLENOL) tablet 650 mg  650 mg Oral Q4H PRN Arby BarrettePfeiffer, Marcy, MD      . amLODipine (NORVASC) tablet 10 mg  10 mg Oral Daily Arby BarrettePfeiffer, Marcy, MD   Stopped at 08/22/17 1310  . diphenhydrAMINE (BENADRYL) capsule 50 mg  50 mg Oral Q6H PRN Truman HaywardStarkes, Takia S, FNP       Or  . diphenhydrAMINE (BENADRYL) injection 50 mg  50 mg Intramuscular Q6H PRN Truman HaywardStarkes, Takia S, FNP   50 mg at 08/22/17 2257  . LORazepam (ATIVAN) injection 2 mg  2 mg Intramuscular Q4H PRN Roxy HorsemanBrowning, Robert, PA-C   2 mg at 08/23/17 0144  . OLANZapine (ZYPREXA) injection 10 mg  10 mg Intramuscular BID PRN Starkes,  Juel Burrow, FNP   10 mg at 08/22/17 1938  . OLANZapine zydis (ZYPREXA) disintegrating tablet 5 mg  5 mg Oral Daily Truman Hayward, FNP   5 mg at 08/22/17 1507  . pravastatin (PRAVACHOL) tablet 80 mg  80 mg Oral Daily Arby Barrette, MD   Stopped at 08/22/17 1313  . traZODone (DESYREL) tablet 100 mg  100 mg Oral QHS Arby Barrette, MD   100 mg at 08/22/17 2201  . ziprasidone (GEODON) capsule 60 mg  60 mg Oral QHS Arby Barrette, MD       Current Outpatient Medications  Medication Sig Dispense Refill  . amLODipine (NORVASC) 10 MG tablet Take 10 mg by mouth daily.    . clobetasol ointment (TEMOVATE) 0.05 % Apply 1 application topically 2 (two) times daily.    Marland Kitchen doxazosin (CARDURA) 8 MG tablet Take 8 mg by mouth daily.    . ferrous sulfate  (FER-IN-SOL) 75 (15 Fe) MG/ML SOLN Take 15 mg of iron by mouth daily.    . folic acid (FOLVITE) 1 MG tablet Take 1 mg by mouth daily.    Marland Kitchen GLUCOSAMINE-CHONDROITIN PO Take 1 tablet by mouth daily.     Marland Kitchen lamoTRIgine (LAMICTAL) 100 MG tablet Take 100 mg by mouth daily.    Marland Kitchen lamoTRIgine (LAMICTAL) 25 MG tablet Take 25 mg by mouth daily. Take daily with 100 mg    . meloxicam (MOBIC) 15 MG tablet Take 15 mg by mouth daily.    . Multiple Vitamins-Minerals (MULTIVITAMIN ADULT PO) Take by mouth.    . pravastatin (PRAVACHOL) 80 MG tablet Take 80 mg by mouth daily.    Marland Kitchen PRESCRIPTION MEDICATION Urinozinc Prostate Formula 100mg     . tiZANidine (ZANAFLEX) 2 MG tablet Take by mouth every 6 (six) hours as needed for muscle spasms.    . traZODone (DESYREL) 100 MG tablet Take 100 mg by mouth at bedtime.    . triamcinolone ointment (KENALOG) 0.1 % Apply 1 application topically 2 (two) times daily.    . ziprasidone (GEODON) 40 MG capsule Take 40 mg by mouth every morning.    . ziprasidone (GEODON) 60 MG capsule Take 60 mg by mouth at bedtime.  0   PTA Medications:  (Not in a hospital admission)  Musculoskeletal: Strength & Muscle Tone: within normal limits Gait & Station: normal Patient leans: N/A  Psychiatric Specialty Exam: Physical Exam  Nursing note and vitals reviewed. Constitutional: He is oriented to person, place, and time. He appears well-developed and well-nourished.  HENT:  Head: Normocephalic and atraumatic.  Neck: Normal range of motion.  Respiratory: Effort normal.  Musculoskeletal: Normal range of motion.  Neurological: He is alert and oriented to person, place, and time.  Skin: No rash noted.  Psychiatric: He has a normal mood and affect. His speech is normal and behavior is normal. Judgment and thought content normal. Cognition and memory are normal.    Review of Systems  Psychiatric/Behavioral: Positive for substance abuse. Negative for suicidal ideas.  All other systems reviewed  and are negative.   Blood pressure (!) 134/95, pulse (!) 107, temperature 98.3 F (36.8 C), temperature source Oral, resp. rate 18, height 5\' 9"  (1.753 m), weight 122.5 kg, SpO2 100 %.Body mass index is 39.87 kg/m.  General Appearance: Fairly Groomed, middle aged, African American male, wearing paper hospital scrubs and sitting in a chair. NAD.   Eye Contact:  Good  Speech:  Clear and Coherent and Normal Rate  Volume:  Normal  Mood:  Euthymic  Affect:  Appropriate  Thought Process:  Goal Directed, Linear and Descriptions of Associations: Intact  Orientation:  Full (Time, Place, and Person)  Thought Content:  Logical  Suicidal Thoughts:  No  Homicidal Thoughts:  No  Memory:  Immediate;   Good Recent;   Good Remote;   Good  Judgement:  Fair  Insight:  Good and Fair  Psychomotor Activity:  Normal  Concentration:  Concentration: Good and Attention Span: Good  Recall:  Good  Fund of Knowledge:  Good  Language:  Good  Akathisia:  No  Handed:  Right  AIMS (if indicated):   N/A  Assets:  Communication Skills Housing Social Support  ADL's:  Intact  Cognition:  WNL  Sleep:   Fair   Assessment:  Chris BealsBennie Cueto is a 42 y.o. male who was admitted to the hospital with psychosis in the setting of poor medication compliance. He is improved with medication management and appropriate in behavior and organized in thought process today. He no longer warrants inpatient psychiatric hospitalization for stabilization and treatment.   Demographic Factors:  Male  Loss Factors: NA  Historical Factors: Impulsivity  Risk Reduction Factors:   Positive social support and Positive therapeutic relationship  Continued Clinical Symptoms:  Bipolar disorder  Cognitive Features That Contribute To Risk:  Closed-mindedness    Suicide Risk:  Minimal: No identifiable suicidal ideation.  Patients presenting with no risk factors but with morbid ruminations; may be classified as minimal risk based on the  severity of the depressive symptoms    Plan Of Care/Follow-up recommendations:  -Continue home psychotropic medications: Lamictal 125 mg daily for mood stabilization, Trazodone 100 mg qhs for insomnia and Geodon 40 mg q am and 60 mg qhs for mood stabilization. -Follow up with outpatient mental health provider.   Disposition: Home Cherly BeachJacqueline J Danahi Reddish, DO 08/23/2017, 10:38 AM

## 2017-08-23 NOTE — Progress Notes (Signed)
Discharge note: Pt has all his belongings.. Staff walked out with him and sister. [facility].

## 2017-08-23 NOTE — ED Notes (Signed)
Patient has been woke the entire nurses station. Patient has been restless and manic all shift. Encouragement and support provided and safety maintain. Q 15 min safety checks remain in place and video monitoring.

## 2017-08-23 NOTE — Discharge Instructions (Signed)
-  Follow up with your outpatient providers. -Continue your medications as prescribed.

## 2017-08-23 NOTE — ED Notes (Signed)
Pt took shower °

## 2017-08-23 NOTE — ED Notes (Signed)
Pt requested pastoral services. [facility] notified pastoral to visit patient.

## 2017-11-13 ENCOUNTER — Other Ambulatory Visit: Admit: 2017-11-13 | Payer: Medicare (Managed Care)

## 2017-11-13 ENCOUNTER — Ambulatory Visit: Admit: 2017-11-13 | Discharge: 2017-11-13 | Payer: Medicare (Managed Care)

## 2017-11-13 DIAGNOSIS — C8409 Mycosis fungoides, extranodal and solid organ sites: Secondary | ICD-10-CM

## 2017-11-13 DIAGNOSIS — I10 Essential (primary) hypertension: Secondary | ICD-10-CM

## 2017-11-13 LAB — COMPREHENSIVE METABOLIC PANEL, SERUM
ALT: 21 U/L (ref 7–52)
AST (SGOT): 27 U/L (ref 13–39)
Albumin: 4.4 g/dL (ref 3.5–5.7)
Alkaline Phosphatase: 64 U/L (ref 36–125)
Anion Gap: 8 mmol/L (ref 3–16)
BUN: 16 mg/dL (ref 7–25)
CO2: 25 mmol/L (ref 21–33)
Calcium: 9.8 mg/dL (ref 8.6–10.3)
Chloride: 103 mmol/L (ref 98–110)
Creatinine: 0.94 mg/dL (ref 0.60–1.30)
Glucose: 105 mg/dL (ref 70–100)
Osmolality, Calculated: 284 mOsm/kg (ref 278–305)
Potassium: 4.2 mmol/L (ref 3.5–5.3)
Sodium: 136 mmol/L (ref 133–146)
Total Bilirubin: 0.6 mg/dL (ref 0.0–1.5)
Total Protein: 7.2 g/dL (ref 6.4–8.9)
eGFR AA CKD-EPI: >90 See note.
eGFR NONAA CKD-EPI: >90 See note.

## 2017-11-13 LAB — LIPID PANEL
Cholesterol, Total: 164 mg/dL (ref 0–200)
HDL: 60 mg/dL (ref 60–92)
LDL Cholesterol: 96 mg/dL
Triglycerides: 39 mg/dL (ref 10–149)

## 2017-11-13 LAB — CBC
Hematocrit: 41.6 % (ref 38.5–50.0)
Hemoglobin: 13.1 g/dL (ref 13.2–17.1)
MCH: 25 pg (ref 27.0–33.0)
MCHC: 31.5 g/dL (ref 32.0–36.0)
MCV: 79.2 fL (ref 80.0–100.0)
MPV: 7.1 fL (ref 7.5–11.5)
Platelets: 335 10*3/uL (ref 140–400)
RBC: 5.25 10*6/uL (ref 4.20–5.80)
RDW: 14.5 % (ref 11.0–15.0)
WBC: 8.1 10*3/uL (ref 3.8–10.8)

## 2017-11-13 LAB — HEMOGLOBIN A1C: Hemoglobin A1C: 6.8 % (ref 4.0–5.6)

## 2017-11-13 NOTE — Progress Notes (Signed)
Needs referral for ortho - meniscus tear , psychiatry - bipolar, dermatolgy - ? Some kind of rash      Had flu shot in mid oct.     New to [place]

## 2017-11-13 NOTE — Patient Instructions (Addendum)
Call [phone] for dermatology-Dr. Edwyna Shell or Dr. Gaynelle Arabian    Call [phone]. Janeth Rase

## 2017-11-13 NOTE — Unmapped (Signed)
History of Present Illness:   Nathaniel Maxwell is a 42 y.o. male  New pt to practice    HPI   Pt is very talkative and fixated on religion. Had to push to get him to speak about medical issues  htn->10 years   allergic rhinitis--takes claritin  Goes to gym daily  Histories:   See listed              Review of Systems   Constitutional: Positive for weight loss. Negative for weight gain.   HENT: Negative for hearing loss.    Eyes: Positive for visual disturbance (reading more difficult).   Respiratory: Negative for shortness of breath.    Cardiovascular: Negative for chest pain.   Genitourinary: Positive for frequency.   Neurological: Positive for headaches (occas).   Psychiatric/Behavioral: Positive for sleep disturbance (only gets 2-3 hours of good quality of sleep). The patient is hyperactive.        Allergies:   Topamax [topiramate] and Zyprexa [olanzapine]    Medications:     Outpatient Encounter Medications as of 11/13/2017   Medication Sig Dispense Refill   ??? amLODIPine (NORVASC) 10 MG tablet Take 10 mg by mouth daily. Indications: hypertension     ??? calcium citrate-vitamin D3 1,000 mg-400 unit/30 mL Liqd Take 1 tablet by mouth daily.     ??? doxazosin (CARDURA) 8 MG tablet Take 8 mg by mouth at bedtime. Indications: hypertension     ??? hydroCHLOROthiazide (MICROZIDE) 12.5 mg capsule Take 12.5 mg by mouth daily.     ??? loratadine (CLARITIN) 10 mg tablet Take 10 mg by mouth daily.     ??? methylsulfonylmethane (MSM) 1,000 mg Cap Take 1,000 mg by mouth 2 times a day.     ??? multivitamin with minerals (BEE-ZEE) tablet Take 1 tablet by mouth daily.     ??? triamcinolone (KENALOG) 0.025 % ointment Apply 300 g topically 3 times a day. To areas of rash height 71 inches weight 238 lb- please dispense large tub     ??? hydrOXYzine pamoate (VISTARIL) 25 MG capsule Take 25 mg by mouth if needed for Anxiety. Indications: anxiety     ??? lamoTRIgine (LAMICTAL) 100 MG tablet Take 100 mg by mouth daily.     ??? traZODone (DESYREL) 100 MG  tablet Take 100 mg by mouth.     ??? ziprasidone (GEODON) 40 MG capsule Take 40 mg by mouth every morning before breakfast.     ??? ziprasidone (GEODON) 60 MG capsule Take 120 mg by mouth at bedtime. Take with food       No facility-administered encounter medications on file as of 11/13/2017.         Objective:       Blood pressure (!) 157/98, pulse 93, temperature 97.2 ??F (36.2 ??C), temperature source Temporal, height 5' 11 (1.803 m), weight (!) 238 lb 9.6 oz (108.2 kg), SpO2 99 %.    Physical Exam  Constitutional:       Appearance: Normal appearance. He is well-developed.   HENT:      Head: Normocephalic and atraumatic.      Right Ear: External ear normal.      Left Ear: External ear normal.      Mouth/Throat:      Pharynx: No oropharyngeal exudate.   Eyes:      Conjunctiva/sclera: Conjunctivae normal.      Pupils: Pupils are equal, round, and reactive to light.   Neck:      Musculoskeletal: Normal range of  motion and neck supple.      Thyroid: No thyromegaly.      Trachea: No tracheal deviation.   Cardiovascular:      Rate and Rhythm: Normal rate and regular rhythm.      Heart sounds: Normal heart sounds.   Pulmonary:      Effort: Pulmonary effort is normal. No respiratory distress.      Breath sounds: Normal breath sounds. No wheezing or rales.   Abdominal:      General: Bowel sounds are normal. There is no distension.      Palpations: Abdomen is soft.      Tenderness: There is no tenderness. There is no guarding or rebound.   Musculoskeletal:         General: No tenderness.   Lymphadenopathy:      Cervical: No cervical adenopathy.   Skin:     General: Skin is warm and dry.      Comments: Dry,roughened skin-hypo/hyperpigmented skin   Neurological:      Mental Status: He is alert and oriented to person, place, and time.   Psychiatric:         Speech: Speech is rapid and pressured.         Behavior: Behavior normal.         Thought Content: Thought content normal.      Comments: hyperreligiousity             Assessment:      Please see below   Plan:   Establish care for new doctor-check a1c,cmp,cbc,lipid for htn followup       BAD--needs to see psychiatrist. 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 Refer to derm

## 2017-11-14 DIAGNOSIS — F3189 Other bipolar disorder: Secondary | ICD-10-CM

## 2017-11-14 MED ORDER — traZODone (DESYREL) 100 MG tablet
100 | Freq: Every evening | ORAL | Status: AC
Start: 2017-11-14 — End: 2017-11-14

## 2017-11-14 MED ORDER — traZODone (DESYREL) 50 MG tablet
50 | ORAL_TABLET | Freq: Every evening | ORAL | 2 refills | Status: AC
Start: 2017-11-14 — End: 2017-12-07

## 2017-11-14 MED ORDER — ziprasidone (GEODON) 60 MG capsule
60 | Freq: Every evening | ORAL | Status: AC
Start: 2017-11-14 — End: 2017-12-07

## 2017-11-14 MED ORDER — lamoTRIgine (LAMICTAL) 25 MG tablet
25 | Freq: Every day | ORAL | 1.00 refills | 30.00000 days | Status: AC
Start: 2017-11-14 — End: 2017-12-07

## 2017-11-14 MED ORDER — ziprasidone (GEODON) 40 MG capsule
40 | Freq: Every morning | ORAL | Status: AC
Start: 2017-11-14 — End: 2017-12-07
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
## 2017-11-14 NOTE — ED Triage Notes (Addendum)
42 year old AA male presents to pes lobby brought in by his sister Dr. Lucretia Roers who is a PCP through [facility].  He presents agitated and hypomanic.  He is quite easily agitated and presents trying to intimidate this Clinical research associate and very argumentative throughout interview. Initially focused on asking this writer every question that was ask to him.  He is reporting that he recently moved from [place] and his wife had filed for divorce after 17 years of marriage. His sister reports he had his first bipolar break during college and he was managed well on medication for about fifteen years.  Sister reports he has one biological son.  He appears quite upset that his wife is filing to divorce him.  He denies current auditory or visual hallucinations.  He states I do talk to god. Do you.?  What is the name of the God that you talk to?  Can he get me a job like you have.  I don't believe in God anymore.  He reports he has long history of bipolar disorder and he quit taking his medication on August 27th of this year.  Reports he left [place] on October 3 and is residing with his sister who brought him here tonight.  He states she is his POA.  Sister reports that she has not seen any paperwork to this effect although he reports he sent it to her while still living in [place].  He is unable to answer most questions.  When ask about drugs or alcohol he stated he uses mariajuana and crack and sometimes shoots medication into his vein.  His sister reports she does not believe that he uses drugs.  States he has told mental health people that in the past and it was false.  She states he is very bright and when he comes to places like this he can become antagonistic and confrontational.  She reports she is not sure if he is suicidal or not because he did make some comments today that were not usual for him.  She states there was a chang in his demeanor today and she is not sure if something happened or  not.  He was unable to answer most questions at this time as he is so confrontational and argumentative.  Sister reports this is how he gets when he is sick.  She is unsure if he needs hospitalization or  to get back on his medications what he needs at present time He will wait in minor care until can be seen and evaluated.

## 2017-11-14 NOTE — ED Notes (Signed)
Discharge instructions and follow up given by Dr. Alvester Morin.  Discharged from minor care accompanied by his sister.

## 2017-11-15 ENCOUNTER — Inpatient Hospital Stay
Admit: 2017-11-15 | Discharge: 2017-11-15 | Disposition: A | Discharge status: Home / Self Care | Payer: Medicare (Managed Care)

## 2017-11-20 ENCOUNTER — Inpatient Hospital Stay
Admission: EM | Admit: 2017-11-20 | Discharge: 2017-11-20 | Disposition: A | Discharge status: Other Acute Inpatient Hospital | Payer: Medicare (Managed Care) | Admitting: Psychiatry

## 2017-11-20 DIAGNOSIS — F3110 Bipolar disorder, current episode manic without psychotic features, unspecified: Secondary | ICD-10-CM

## 2017-11-20 LAB — URINALYSIS W/RFL TO MICROSCOPIC
Bilirubin, UA: NEGATIVE
Blood, UA: NEGATIVE
Glucose, UA: NEGATIVE mg/dL
Ketones, UA: NEGATIVE mg/dL
Leukocytes, UA: NEGATIVE
Nitrite, UA: NEGATIVE
Protein, UA: NEGATIVE mg/dL
Specific Gravity, UA: 1.025 (ref 1.005–1.035)
Urobilinogen, UA: 0.2 EU/dL (ref 0.2–1.0)
pH, UA: 5.5 (ref 5.0–8.0)

## 2017-11-20 LAB — POC MEDTOX DRUG SCREEN, URINE
Amphetamine, 500 ng/mL Cutoff: NEGATIVE
Barbiturates, 200 ng/mL Cutoff: NEGATIVE
Benzodiazepines, 150 ng/mL Cutoff: NEGATIVE
Buprenorphine, 10 ng/mL Cutoff: NEGATIVE
Cocaine metabolite, 150 ng/mL Cutoff: NEGATIVE
Methadone, 200 ng/mL Cutoff: NEGATIVE
Methamphetamine, 500 ng/mL Cutoff: NEGATIVE
Opiates, 100 ng/mL Cutoff: NEGATIVE
Oxycodone, 100 ng/mL Cutoff: NEGATIVE
Phencyclidine, 25 ng/mL Cutoff: NEGATIVE
Propoxyphene, 300 ng/mL Cutoff: NEGATIVE
THC, 50 ng/mL Cutoff: NEGATIVE
Tricyclic Antidepressants, 300 ng/mL Cutoff: NEGATIVE

## 2017-11-20 LAB — MEDTOX DRUG SCREEN W/ FENTANYL: Fentanyl, 2 ng/mL Cutoff: NEGATIVE

## 2017-11-20 MED ORDER — risperiDONE M-TAB (RISPERDAL M-TABS) disintegrating tablet 1 mg
1 | Freq: Four times a day (QID) | ORAL | Status: AC | PRN
Start: 2017-11-20 — End: 2017-11-20

## 2017-11-20 MED ORDER — hydroCHLOROthiazide (MICROZIDE) capsule 12.5 mg
12.5 | Freq: Every day | ORAL | Status: AC
Start: 2017-11-20 — End: 2017-11-20

## 2017-11-20 MED ORDER — loratadine (CLARITIN) tablet 10 mg
10 | Freq: Every day | ORAL | Status: AC
Start: 2017-11-20 — End: 2017-11-20

## 2017-11-20 MED ORDER — hydroCHLOROthiazide (MICROZIDE) capsule 12.5 mg
12.5 | Freq: Every day | ORAL | Status: AC
Start: 2017-11-20 — End: 2017-11-20
  Administered 2017-11-20: 11:00:00 12.5 mg via ORAL

## 2017-11-20 MED ORDER — amLODIPine (NORVASC) tablet 10 mg
10 | Freq: Every day | ORAL | Status: AC
Start: 2017-11-20 — End: 2017-11-20

## 2017-11-20 MED ORDER — ziprasidone (GEODON) injection 20 mg
20 | INTRAMUSCULAR | Status: AC | PRN
Start: 2017-11-20 — End: 2017-11-20

## 2017-11-20 MED ORDER — acetaminophen (TYLENOL) tablet 650 mg
325 | ORAL | Status: AC | PRN
Start: 2017-11-20 — End: 2017-11-20

## 2017-11-20 MED ORDER — LORazepam (ATIVAN) tablet 1 mg
1 | ORAL | Status: AC | PRN
Start: 2017-11-20 — End: 2017-11-20

## 2017-11-20 MED ORDER — ziprasidone (GEODON) injection 20 mg
20 | Freq: Once | INTRAMUSCULAR | Status: AC
Start: 2017-11-20 — End: 2017-11-20
  Administered 2017-11-20: 10:00:00 20 mg via INTRAMUSCULAR

## 2017-11-20 MED ORDER — amLODIPine (NORVASC) tablet 10 mg
10 | Freq: Every day | ORAL | Status: AC
Start: 2017-11-20 — End: 2017-11-20
  Administered 2017-11-20: 11:00:00 10 mg via ORAL

## 2017-11-20 MED ORDER — LORazepam (ATIVAN) 2 mg/mL injection
2 | INTRAMUSCULAR | Status: AC
Start: 2017-11-20 — End: 2017-11-20
  Administered 2017-11-20: 10:00:00 2 via INTRAMUSCULAR

## 2017-11-20 MED ORDER — acetaminophen (TYLENOL) tablet 325 mg
325 | ORAL | Status: AC | PRN
Start: 2017-11-20 — End: 2017-11-20

## 2017-11-20 MED ORDER — LORazepam (ATIVAN) injection 2 mg
2 | Freq: Once | INTRAMUSCULAR | Status: AC
Start: 2017-11-20 — End: 2017-11-20

## 2017-11-20 MED ORDER — LORazepam (ATIVAN) tablet 0.5 mg
0.5 | ORAL | Status: AC | PRN
Start: 2017-11-20 — End: 2017-11-20

## 2017-11-20 MED ORDER — LORazepam (ATIVAN) tablet 2 mg
1 | Freq: Once | ORAL | Status: AC
Start: 2017-11-20 — End: 2017-11-20

## 2017-11-20 MED ORDER — risperiDONE M-TAB (RISPERDAL M-TABS) disintegrating tablet 1 mg
1 | Freq: Two times a day (BID) | ORAL | Status: AC
Start: 2017-11-20 — End: 2017-11-20

## 2017-11-20 MED ORDER — risperiDONE M-TAB (RISPERDAL M-TABS) disintegrating tablet 2 mg
2 | Freq: Two times a day (BID) | ORAL | Status: AC | PRN
Start: 2017-11-20 — End: 2017-11-20

## 2017-11-20 MED ORDER — risperiDONE M-TAB (RISPERDAL M-TABS) disintegrating tablet 2 mg
2 | Freq: Once | ORAL | Status: AC
Start: 2017-11-20 — End: 2017-11-20

## 2017-11-20 MED ORDER — LORazepam (ATIVAN) tablet 2 mg
1 | ORAL | Status: AC | PRN
Start: 2017-11-20 — End: 2017-11-20

## 2017-11-20 MED FILL — HYDROCHLOROTHIAZIDE 12.5 MG CAPSULE: 12.5 12.5 mg | ORAL | Qty: 1

## 2017-11-20 MED FILL — GEODON 20 MG/ML (FINAL CONCENTRATION) INTRAMUSCULAR SOLUTION: 20 20 mg/mL (final conc.) | INTRAMUSCULAR | Qty: 20

## 2017-11-20 MED FILL — LORAZEPAM 2 MG/ML INJECTION SOLUTION: 2 2 mg/mL | INTRAMUSCULAR | Qty: 1

## 2017-11-20 MED FILL — AMLODIPINE 10 MG TABLET: 10 10 MG | ORAL | Qty: 1

## 2017-11-20 MED FILL — LORAZEPAM 1 MG TABLET: 1 1 MG | ORAL | Qty: 2

## 2017-11-20 MED FILL — RISPERIDONE 2 MG DISINTEGRATING TABLET: 2 2 MG | ORAL | Qty: 1

## 2017-11-20 MED FILL — LORATADINE 10 MG TABLET: 10 10 mg | ORAL | Qty: 1

## 2017-11-20 NOTE — Unmapped (Signed)
Attempted to call report for 2 ond time to Adult Unit with no answer.

## 2017-11-20 NOTE — Unmapped (Signed)
Nathaniel Maxwell is a 42 y.o. black or AA male self presented to PES lobby with his brother. States he is here because I cant sleep. Off all medication. Denies any current alcohol or drug use. Denies SI/HI/AVH. However states that I talk to god through scripture. He is religiously preoccupied, hyperverbal, and grandiose. Hx of Bipolar Disorder, HTN.  However he states that he is not going to take any medications and is aware of his behavior and his diagnosis. Stated I am going to stay whatever I need to get into the hospital however he states this is only so he can be observed.   He was recently in [facility] 11/13/17 and seen by Dr. Alvester Morin in which it appears his presentation was concerning. Pt currently waiting in lobby with his brother.

## 2017-11-20 NOTE — Unmapped (Signed)
Patient requested and given pretzels,peanut butter crackers, and juice. Spoke to [place] dispatcher ETA is 12 minutes.

## 2017-11-20 NOTE — Unmapped (Signed)
Pt was complaint with scheduled norvasc and microzide. BP 154/84 HR 93 at 0623. No complaints or needs voiced. Resting in OBS18. No distress or discomfort observed.

## 2017-11-20 NOTE — Unmapped (Signed)
[facility]iatric [facility]  Service Evaluation    Reason for Visit/Chief Complaint: Insomnia      Patient History     HPI   Pt is a 42 yr old male withh/o BAD who self presents with his brother for nightmares.    On interview, he has FOI, hyper religiosity, tangential, very manic wit significant PMA. Difficult to follow his train of thoughts. He stopped taking his meds in August after his wife announced a divorce. Reports he has been seeing strange things when he closes his eyes. Wanted to see a priest but report a doctor is next best thing; hyper religious and asked if I can speak to my ancestors for him. Was here last month and per documentation and per documentation, was on verge of admission but discharged home with meds. He hasn't been taking the meds. He is from [facility] and recently moved here after admitted in [place] in August for BAD after he stopped his meds when his wife told him she wanted a divorce.    PES Triage Screening:  Broset score:             PSS- Suicide Assessment Score : 0  Suicide Screen: Is patient expressing suicidal ideations?: No    Is Admission due to self harm?: No    Has Patient Attempted Suicide or Self Harm in past 72 hours?: No    Is Patient experiencing acute agitation, anxiety or insomnia?  : No    Context: stress, non-adherence withmeds  Location: Altered mental status of mood  Duration: 3 months.  Severity: severe.  Associated Symptoms: severe.  Modifying Factors: medication noncompliance.  Timing: Constant.       Past Psychiatric History:     Hospitalizations: yes, In [place], [place] 08/2017    Past suicide attempts: no.    History of violence: no.    Substance Use History: denies.    PMH:       Past Medical History:   Diagnosis Date   ??? Bipolar affective disorder (CMS Dx)    ??? HTN (hypertension)     age 76   ??? Mycosis fungoides (CMS Dx)      I have reviewed the past medical history.  Additional history obtained: yes    Social History:    Work History:  Unemployed  Living with  his brother who is actually a friend.    Social History     Socioeconomic History   ??? Marital status: Legally Separated     Spouse name: None   ??? Number of children: None   ??? Years of education: None   ??? Highest education level: None   Occupational History   ??? None   Social Needs   ??? Financial resource strain: None   ??? Food insecurity:     Worry: None     Inability: None   ??? Transportation needs:     Medical: None     Non-medical: None   Tobacco Use   ??? Smoking status: Never Smoker   ??? Smokeless tobacco: Never Used   Substance and Sexual Activity   ??? Alcohol use: Not Currently     Frequency: Never   ??? Drug use: Not Currently   ??? Sexual activity: None   Lifestyle   ??? Physical activity:     Days per week: None     Minutes per session: None   ??? Stress: None   Relationships   ??? Social connections:     Talks on phone:  None     Gets together: None     Attends religious service: More than 4 times per year     Active member of club or organization: None     Attends meetings of clubs or organizations: None     Relationship status: Separated   ??? Intimate partner violence:     Fear of current or ex partner: None     Emotionally abused: None     Physically abused: None     Forced sexual activity: None   Other Topics Concern   ??? Caffeine Use Not Asked   ??? Occupational Exposure Not Asked   ??? Exercise Not Asked   ??? Seat Belt Not Asked   Social History Narrative   ??? None     I have reviewed the past social history.  Additional history obtained: yes.    Family History:  Father committed suicide when he was 10  Family History   Problem Relation Age of Onset   ??? Suicidality Father    ??? Hyperlipidemia Mother    ??? Depression Sister    ??? Depression Brother    ??? Depression Maternal Grandfather      I have reviewed the past family history.  Additional history obtained: yes.    Medications:  Previous Medications    AMLODIPINE (NORVASC) 10 MG TABLET    Take 10 mg by mouth daily. Indications: hypertension    CALCIUM CITRATE-VITAMIN D3 1,000  MG-400 UNIT/30 ML LIQD    Take 1 tablet by mouth daily.    DOXAZOSIN (CARDURA) 8 MG TABLET    Take 8 mg by mouth at bedtime. Indications: hypertension    HYDROCHLOROTHIAZIDE (MICROZIDE) 12.5 MG CAPSULE    Take 12.5 mg by mouth daily.    HYDROXYZINE PAMOATE (VISTARIL) 25 MG CAPSULE    Take 25 mg by mouth if needed for Anxiety. Indications: anxiety    LAMOTRIGINE (LAMICTAL) 25 MG TABLET    Take 1 tablet (25 mg total) by mouth daily. Indications: Bipolar Disorder    LORATADINE (CLARITIN) 10 MG TABLET    Take 10 mg by mouth daily.    METHYLSULFONYLMETHANE (MSM) 1,000 MG CAP    Take 1,000 mg by mouth 2 times a day.    MULTIVITAMIN WITH MINERALS (BEE-ZEE) TABLET    Take 1 tablet by mouth daily.    TRAZODONE (DESYREL) 50 MG TABLET    Take 1 tablet (50 mg total) by mouth at bedtime.    TRIAMCINOLONE (KENALOG) 0.025 % OINTMENT    Apply 300 g topically 3 times a day. To areas of rash height 71 inches weight 238 lb- please dispense large tub    ZIPRASIDONE (GEODON) 40 MG CAPSULE    Take 1 capsule (40 mg total) by mouth every morning before breakfast. Indications: CALM MIND    ZIPRASIDONE (GEODON) 60 MG CAPSULE    Take 2 capsules (120 mg total) by mouth at bedtime. Take with food  Indications: CALM MIND       Allergies:   Allergies as of 11/20/2017 - Fully Reviewed 11/20/2017   Allergen Reaction Noted   ??? Topamax [topiramate]  11/13/2017   ??? Zyprexa [olanzapine]  11/13/2017       Review of Systems     Review of Systems      Physical Exam/Objective Data     ED Triage Vitals   Vital Signs Group      Temp 11/20/17 0255 97.8 ??F (36.6 ??C)      Temp Source 11/20/17  0255 Oral      Heart Rate 11/20/17 0255 97      Heart Rate Source 11/20/17 0553 Automatic      Resp 11/20/17 0255 16      SpO2 11/20/17 0255 97 %      BP 11/20/17 0255 (!) 175/97      MAP (mmHg) 11/20/17 0553 108      BP Location 11/20/17 0255 Left arm      BP Method 11/20/17 0255 Automatic      Patient Position 11/20/17 0255 Sitting   SpO2 11/20/17 0255 97 %   O2 Device  11/20/17 0255 None (Room air)       Physical Exam    Mental Status Exam:     Gait and Muscle Strength:  Normal  Appearance and Behavior: Hyperactive AA male wearing casual clothing, +PMA      Poor grooming and hygiene  Speech: Hyperverbal, fast, pressured  Language: Naming intact  Mood: great  Affect: Euphoric, irritable at times, somewhat labile  Thought Process and Associations: +FOI, LOA, tangential  Thought Content: hyperreligious, FOI  Abnormal or psychotic thoughts: None  Orientation: person place time situation  Memory: recent, remote, and immediate recall intact  Attention and Concentration: impaired  Fund of Knowledge: average  Insight and Judgement: Minimal     Impaired        Labs:    Please see electronic medical record for any tests performed in the ED.    No results found for this or any previous visit (from the past 24 hour(s)).    Radiology and EKG:  No results found.    EKG: Please see electronic medical record for any studies performed in the ED.    Emergency Course and Plan     Elber Galyean is a 42 y.o. male who presented to the emergency department with Insomnia       Pt presents with acute mania, will admit      Primary psychiatric Diagnosis: BAD, acute manic episode  Medical Diagnoses: HTN      Disposition:      Admit.  Patient requires inpatient admission due to  mental illness with a demonstrated need for inpatient treatment as least restrictive means.     Provider completing note: Resident, supervised by Dr Josetta Huddle.    Patient was in [place].     Patient had a completed Statement of Belief during this encounter:yes  , put on a 72 hour hold.    Medications given in PES: yes.  Medications prescribed for home or inpatient use: yes.  Laboratory work ordered: yes.  Other diagnostic studies ordered: no.  Old and/or outside medical records reviewed: yes.  Collateral information contacted: yes.  Patient's outside provider contacted: no.         Kathrin Ruddy, MD  Resident  11/20/17  [phone]

## 2017-11-20 NOTE — Unmapped (Signed)
B.Kathyrn Lass, [place] informed of elevated BP and HR.

## 2017-11-20 NOTE — Unmapped (Addendum)
Patient is cooperative with transfer by [place] with all belongings to [facility] with packet for nursing.

## 2017-11-20 NOTE — Unmapped (Signed)
Patient observed lying on right side with blanket covering body and head after refusing medications ordered, Dr. Carolanne Grumbling informed.

## 2017-11-20 NOTE — Unmapped (Signed)
Attempted to call report, transferred to Adult unit with no answer.

## 2017-11-20 NOTE — Unmapped (Signed)
[facility] on the line reports patient has called with questions,connected to patient.

## 2017-11-20 NOTE — Unmapped (Signed)
Patient is awake in TV area of OTA, observed making a phone call, cleaning up his mess, and wearing appropriate hospital garb. Patient is maintained in secured,monitored area with no unsafe,agitated,aggressive behaviors or RTIS observed at this time. Safety maintained with 15 minute checks.

## 2017-11-20 NOTE — Unmapped (Signed)
SW received call from [facility] at [facility] and Dr. Eliseo Gum has accepted pt to [facility].  Time that pt can be transferred is pending due to discharges at their facility.  Will call and notify a time for the afternoon.

## 2017-11-20 NOTE — Unmapped (Signed)
Observed in OTA social with male peer.

## 2017-11-20 NOTE — Unmapped (Signed)
Spoke to Nathaniel Maxwell on Adult unit, RN to call me for report. [facility] informed patient is being picked up at 1630.

## 2017-11-20 NOTE — Unmapped (Signed)
Patient informed and verbalized understanding and agreement to pickup at 1630 to transfer to [facility] in [place]. Patient asked for and given phone number to [facility].

## 2017-11-20 NOTE — Unmapped (Addendum)
Awake, maintains fair eye contact with disorganized behavior evidenced by blankets on floor, fixated with taking a shower, not following direction to put hospital gown back on. Patient informed urine specimen is needed, informs nurse he is unable to provided, reporting he just, peed on my self  With no evidence of that. Patient given and accepts urine specimen cup,clean gown,pants, towels, washcloth , and soap. Patient returns to bed, observed lying on left side. Support and reassurance offered and accepted. Safety maintained with 15 minute checks.

## 2017-11-20 NOTE — Unmapped (Signed)
PT increasingly irritable and hyperverbal. Difficulty following direction and restless. Seen in hallway holding head in aggravated fashion. Offered PO medications, pt did not want to take them. However stated you know what, just shoot me up. Pills don't work Chartered certified accountant. POD informed. POD out to see pt. Geodon 20 mg and Ativan 2 mg ordered and given without incident. Pt then making phone calls to mother stating see mom I got the medications like you said, and when I leave I ain't taking shit.. No further behavioral issues to current time.

## 2017-11-20 NOTE — Unmapped (Signed)
Pt is up and pacing. Made phone calls. Would like police chief called to check on the status of his children. Provided with food and fluids. Blanket provided. Now admit to OTA and pt checked in per PES policy. Pt cooperative but slightly irritable.

## 2017-11-20 NOTE — Unmapped (Signed)
SW faxed packet to [facility] for possible transfer/admission.

## 2017-11-20 NOTE — Unmapped (Signed)
Social Work Note:    SW received call from sister deakon frix [phone], who reports to be medical POA.  I informed of admission with no identified bed at this time.  She would like to be updated.

## 2017-12-07 ENCOUNTER — Ambulatory Visit: Admit: 2017-12-07 | Payer: Medicare (Managed Care)

## 2017-12-07 DIAGNOSIS — F319 Bipolar disorder, unspecified: Secondary | ICD-10-CM

## 2017-12-07 MED ORDER — blood-glucose meter kit
PACK | 0 refills | 30.00000 days | Status: AC
Start: 2017-12-07 — End: 2018-03-07

## 2017-12-07 MED ORDER — blood sugar diagnostic Strp
Freq: Every day | 3 refills | 30.00000 days | Status: AC
Start: 2017-12-07 — End: 2018-08-07

## 2017-12-07 NOTE — Unmapped (Signed)
History of Present Illness:   Nathaniel Maxwell is a 42 y.o. male    Pre visit planning has been done.     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 Has not started doxazosin  a1c--6.8--drinks diet lemonade, has been on metformin in the past but denies previous diagnosis of diabetes  Left knee pain, was supposed to have surgery for meniscal problem but did not due to cost. Pt has had mri of knee in [place] and has disc       Histories:     He has a past medical history of Bipolar affective disorder (CMS Dx), HTN (hypertension), and Mycosis fungoides (CMS Dx).    He has a past surgical history that includes Knee arthroscopy w/ ACL reconstruction.    His family history includes Depression in his brother, maternal grandfather, and sister; Hyperlipidemia in his mother; Suicidality in his father.    He reports that he has never smoked. He has never used smokeless tobacco. He reports previous alcohol use. He reports previous drug use.      Review of Systems    Allergies:   Topamax [topiramate] and Zyprexa [olanzapine]    Medications:     Outpatient Encounter Medications as of 12/07/2017   Medication Sig Dispense Refill   ??? amLODIPine (NORVASC) 10 MG tablet Take 10 mg by mouth daily. Indications: hypertension     ??? calcium citrate-vitamin D3 1,000 mg-400 unit/30 mL Liqd Take 1 tablet by mouth daily.     ??? doxazosin (CARDURA) 8 MG tablet Take 8 mg by mouth at bedtime. Indications: hypertension     ??? hydroCHLOROthiazide (MICROZIDE) 12.5 mg capsule Take 12.5 mg by mouth daily.     ??? hydrOXYzine pamoate (VISTARIL) 25 MG capsule Take 25 mg by mouth if needed for Anxiety. Indications: anxiety     ??? lamoTRIgine (LAMICTAL) 25 MG tablet Take 1 tablet (25 mg total) by mouth daily. Indications: Bipolar Disorder     ??? loratadine (CLARITIN) 10 mg tablet Take 10 mg by mouth daily.     ??? methylsulfonylmethane (MSM) 1,000 mg Cap Take  1,000 mg by mouth 2 times a day.     ??? multivitamin with minerals (BEE-ZEE) tablet Take 1 tablet by mouth daily.     ??? traZODone (DESYREL) 50 MG tablet Take 1 tablet (50 mg total) by mouth at bedtime. 30 tablet 2   ??? triamcinolone (KENALOG) 0.025 % ointment Apply 300 g topically 3 times a day. To areas of rash height 71 inches weight 238 lb- please dispense large tub     ??? ziprasidone (GEODON) 40 MG capsule Take 1 capsule (40 mg total) by mouth every morning before breakfast. Indications: CALM MIND     ??? ziprasidone (GEODON) 60 MG capsule Take 2 capsules (120 mg total) by mouth at bedtime. Take with food  Indications: CALM MIND       No facility-administered encounter medications on file as of 12/07/2017.         Objective:         Vitals:    12/07/17 1322   BP: 151/84   Pulse: 84   SpO2: 99%      Physical Exam  Vitals signs and nursing note reviewed.   HENT:      Head: Normocephalic and atraumatic.   Neck:      Musculoskeletal: Normal range of motion and neck supple.  Thyroid: No thyromegaly.      Vascular: No carotid bruit.   Cardiovascular:      Rate and Rhythm: Normal rate and regular rhythm.      Heart sounds: Normal heart sounds.   Pulmonary:      Effort: Pulmonary effort is normal. No respiratory distress.      Breath sounds: Normal breath sounds. No wheezing or rales.   Musculoskeletal:         General: Tenderness (left tibial plateau) present.      Comments: Positive mcmurray   Skin:     General: Skin is warm.   Neurological:      General: No focal deficit present.      Mental Status: He is alert.   Psychiatric:         Attention and Perception: Attention normal.         Mood and Affect: Affect normal. Affect is not labile, flat or tearful.         Speech: Speech is not delayed or slurred.      Comments: Less pressured speech than previous visit but mood is heightened still            Assessment:      Please see below   Plan:   htn--pt stopped hctz, still taking amlodipine and needs to restart doxazosin.  Expect bp to improve with this    elevated a1c/prediabetes--only have one a1c elevation and bs was normal on renal. Given living with diabetes book. Consider diabetic education     encounter for medication--seroquel and abikify,check ekg--normal    Chronic left knee pain--per pt has torn meniscus. Has mri disc with him. Positive mcmurray, refer to ortho    BAD--pt to followup with [facility]. Pt on depo abilify and seroquel

## 2017-12-07 NOTE — Unmapped (Signed)
Patient came for hospital follow-up and knee surgery referral, wants to review medications with the provider.    EKG completed as ordered by provider. Pt tolerated without complaint.

## 2017-12-08 ENCOUNTER — Ambulatory Visit: Payer: Medicare (Managed Care)

## 2017-12-11 ENCOUNTER — Ambulatory Visit: Admit: 2017-12-11 | Discharge: 2017-12-11 | Payer: Medicare (Managed Care) | Attending: Sports Medicine

## 2017-12-11 ENCOUNTER — Inpatient Hospital Stay
Admit: 2017-12-11 | Payer: Medicare (Managed Care) | Attending: Student in an Organized Health Care Education/Training Program

## 2017-12-11 DIAGNOSIS — S83282A Other tear of lateral meniscus, current injury, left knee, initial encounter: Secondary | ICD-10-CM

## 2017-12-11 DIAGNOSIS — M25562 Pain in left knee: Secondary | ICD-10-CM

## 2017-12-11 NOTE — Unmapped (Signed)
This office note has been dictated.

## 2017-12-12 NOTE — Unmapped (Signed)
[place]             [facility]    PATIENT NAME:      Nathaniel Maxwell, Nathaniel Maxwell ??                MRN:                 [id number]  DATE OF BIRTH:     07/24/1975                    CSN:                 [id number]  PROVIDER:          Andree Elk, MD               VISIT DATE:          12/11/2017                                   OFFICE NOTE      REASON FOR VISIT:  Left knee meniscus tear.    SUBJECTIVE:  Nathaniel Maxwell is a pleasant 42 year old gentleman who presents to the clinic today regarding left knee pain that he has been experiencing since May.  He has previously had an MRI obtained of the left knee, which demonstrated that he had a   meniscal tear.  He is unsure it if was a medial or lateral meniscal tear.  He had been scheduled to have a surgery performed in [place] back in August when he was still living there, but reportedly he was unable to proceed forward with surgery as   his insurance was not covered by the hospital where the procedure was to be done.  He is now living in [place] and is presenting to [place] today to discuss potential surgery on that knee.  He denies any mechanical symptoms about the knee.  His real   complaints seem to be related to pain and swelling about the left knee.  He has been wearing a knee sleeve.  Of note, the patient had a previous ACL reconstruction on the right knee back in 2000 and denies any issues related to the right knee.  He denies   any numbness or tingling in the left lower extremity.    PHYSICAL EXAMINATION:  Examination of the left knee reveals a mild effusion.  He has full intact range of motion from full extension to about 120 degrees of knee flexion.  He is tender to palpation over the medial joint line and has a pain with McMurray maneuver that localizes   to the outside of the knee.  He is stable to varus and valgus stress testing of the knee.  He may have a mildly positive Lachman test as well as a small  pivot shift, but these cannot be reproduced after the initial pivot shift.    IMAGING:  Four views of the left knee obtained in clinic today were reviewed by [place] and demonstrate only maybe very mild osteoarthritis of the left knee.  The MRI was unavailable for review in clinic today.    ASSESSMENT AND PLAN:  Nathaniel Maxwell is a very pleasant 42 year old gentleman who presents to the clinic with complaints of a left knee meniscal tear.  We were unable to review his MRI today because he did not bring the disk with him.  We would like  to   review that before we would go ahead and get him scheduled for surgery, but his physical exam seems to at least be consistent with a possible medial meniscal tear, but we would like to evaluate the integrity of his ACL as he had a mildly positive pivot   shift potentially in the office today.  We have discussed with him that we can see him back either this Friday or the next Friday to review the MRI with him and discuss surgical plans moving forward.  He understands and agrees with this plan.  Dr. Kem Maxwell   was present for the examination, evaluation of this patient, and agrees with the assessment and plan.        Nathaniel Beech, MD    Andree Elk, MD      BS/AQ  DD:?? 12/11/2017 16:04:33  DT:?? 12/12/2017 11:30:11    JOB#:  [id number]

## 2017-12-21 DIAGNOSIS — F319 Bipolar disorder, unspecified: Secondary | ICD-10-CM

## 2017-12-21 MED ORDER — QUEtiapine (SEROQUEL) 100 MG tablet
100 | ORAL_TABLET | Freq: Every evening | ORAL | 1 refills | Status: AC
Start: 2017-12-21 — End: 2018-07-20

## 2017-12-21 NOTE — Unmapped (Signed)
Bipolar 1 Disorder  Bipolar 1 disorder is a mental health disorder in which a person has episodes of emotional highs (mania), and may also have episodes of emotional lows (depression) in addition to highs. Bipolar 1 disorder is different from other bipolar disorders because it involves extreme manic episodes. These episodes last at least one week or involve symptoms that are so severe that hospitalization is needed to keep the person safe.  What increases the risk?  The cause of this condition is not known. However, certain factors make you more likely to have bipolar disorder, such as:  ?? Having a family member with the disorder.  ?? An imbalance of certain chemicals in the brain (neurotransmitters).  ?? Stress, such as illness, financial problems, or a death.  ?? Certain conditions that affect the brain or spinal cord (neurologic conditions).  ?? Brain injury (trauma).  ?? Having another mental health disorder, such as:  ?? Obsessive compulsive disorder.  ?? Schizophrenia.  What are the signs or symptoms?  Symptoms of mania include:  ?? Very high self-esteem or self-confidence.  ?? Decreased need for sleep.  ?? Unusual talkativeness or feeling a need to keep talking. Speech may be very fast. It may seem like you cannot stop talking.  ?? Racing thoughts or constant talking, with quick shifts between topics that may or may not be related (flight of ideas).  ?? Decreased ability to focus or concentrate.  ?? Increased purposeful activity, such as work, studies, or social activity.  ?? Increased nonproductive activity. This could be pacing, squirming and fidgeting, or finger and toe tapping.  ?? Impulsive behavior and poor judgment. This may result in high-risk activities, such as having unprotected sex or spending a lot of money.  Symptoms of depression include:  ?? Feeling sad, hopeless, or helpless.  ?? Frequent or uncontrollable crying.  ?? Lack of feeling or caring about anything.  ?? Sleeping too much.  ?? Moving more slowly than  usual.  ?? Not being able to enjoy things you used to enjoy.  ?? Wanting to be alone all the time.  ?? Feeling guilty or worthless.  ?? Lack of energy or motivation.  ?? Trouble concentrating or remembering.  ?? Trouble making decisions.  ?? Increased appetite.  ?? Thoughts of death, or the desire to harm yourself.  Sometimes, you may have a mixed mood. This means having symptoms of depression and mania. Stress can make symptoms worse.  How is this diagnosed?  To diagnose bipolar disorder, your health care provider may ask about your:  ?? Emotional episodes.  ?? Medical history.  ?? Alcohol and drug use. This includes prescription medicines. Certain medical conditions and substances can cause symptoms that seem like bipolar disorder (secondary bipolar disorder).  How is this treated?  Bipolar disorder is a long-term (chronic) illness. It is best controlled with ongoing (continuous) treatment rather than treatment only when symptoms occur. Treatment may include:  ?? Medicine. Medicine can be prescribed by a provider who specializes in treating mental disorders (psychiatrist).  ?? Medicines called mood stabilizers are usually prescribed.  ?? If symptoms occur even while taking a mood stabilizer, other medicines may be added.  ?? Psychotherapy. Some forms of talk therapy, such as cognitive-behavioral therapy (CBT), can provide support, education, and guidance.  ?? Coping methods, such as journaling or relaxation exercises. These may include:  ?? Yoga.  ?? Meditation.  ?? Deep breathing.  ?? Lifestyle changes, such as:  ?? Limiting alcohol and drug use.  ??   Exercising regularly.  ?? Getting plenty of sleep.  ?? Making healthy eating choices.  A combination of medicine, talk therapy, and coping methods is best. A procedure in which electricity is applied to the brain through the scalp (electroconvulsive therapy) may be used in cases of severe mania when medicine and psychotherapy work too slowly or do not work.  Follow these instructions at  home:  Activity    ?? Return to your normal activities as told by your health care provider.  ?? Find activities that you enjoy, and make time to do them.  ?? Exercise regularly as told by your health care provider.  Lifestyle  ?? Limit alcohol intake to no more than 1 drink a day for nonpregnant women and 2 drinks a day for men. One drink equals 12 oz of beer, 5 oz of wine, or 1?? oz of hard liquor.  ?? Follow a set schedule for eating and sleeping.  ?? Eat a balanced diet that includes fresh fruits and vegetables, whole grains, low-fat dairy, and lean meat.  ?? Get 7-8 hours of sleep each night.  General instructions  ?? Take over-the-counter and prescription medicines only as told by your health care provider.  ?? Think about joining a support group. Your health care provider may be able to recommend a support group.  ?? Talk with your family and loved ones about your treatment goals and how they can help.  ?? Keep all follow-up visits as told by your health care provider. This is important.  Where to find more information:  For more information about bipolar disorder, visit the following websites:  ?? National Alliance on Mental Illness: www.nami.org  ?? U.S. National Institute of Mental Health: www.nimh.nih.gov  Contact a health care provider if:  ?? Your symptoms get worse.  ?? You have side effects from your medicine, and they get worse.  ?? You have trouble sleeping.  ?? You have trouble doing daily activities.  ?? You feel unsafe in your surroundings.  ?? You are dealing with substance abuse.  Get help right away if:  ?? You have new symptoms.  ?? You have thoughts about harming yourself.  ?? You self-harm.  This information is not intended to replace advice given to you by your health care provider. Make sure you discuss any questions you have with your health care provider.  Document Released: 03/28/2000 Document Revised: 08/16/2015 Document Reviewed: 08/20/2015  Elsevier Interactive Patient Education ?? 2018 Elsevier Inc.

## 2017-12-21 NOTE — Unmapped (Signed)
Pt was discharged from lobby. AVS provided, discharge paperwork reviewed with pt and follow up care discussed.

## 2017-12-21 NOTE — Unmapped (Signed)
42 yr old AA male self-presented to [facility] lobby with chief compliant medication refill,  I was discharge from [facility] on 11/29/17 received [name] injection and this is the 28th day I tried to get it refilled but I'm on medicare and it cost $700 on my insurance card I can't afford that so I was hoping that ya'll could give me that injection I'm connected to [facility] I seen the therapist Beth on 12/19 there but they have not made me appointment with the psychiatrist yet. Pt currently denies SI/AVH/HI but does have a history SA stated,  in 1994 it really wasn't a suicide attempt  I jump off the 2nd floor but not kill myself but it was definitely a learning experience just did something stupid when I was younger does not appear to be a threat to self nor others and is currently in lobby awaiting to be seen.

## 2017-12-21 NOTE — Unmapped (Signed)
[facility] Psychiatric Emergency  Service Evaluation    Reason for Visit/Chief Complaint: Medication Problem      Patient History     HPI  Mr. Roshun Klingensmith is a 42 year-old AA divorced male who comes in slightly manic looking for his [name] Maintenna injection.  There is no way we can prescribe it here due to policy considerations.  It also appears that there is no way that he can get it at all. He has medicare but he needs medicaid to pay for something that is so expensive.  He is not a danger at this time.  He has been stable with the help of seroquel.  He is showing very few manic signs. I suspect that he is stable as a result of the Seroquel.  Therefore I will increase the Seroquel from 300 mg to 400 mg.  Again he will not get abilify long acting injection.      PES Triage Screening:  Broset score:             PSS- Suicide Assessment Score : 1  Suicide Screen:                     Context: other: unable to get abilify maintenna.    Location: Altered mental status of mood  Duration: one day  Severity: mild .  Associated Symptoms: mild .  Modifying Factors: other: .  Timing: Constant.      Past Psychiatric History:     Hospitalizations: yes - recently at [facility].    Past suicide attempts: no.    History of violence: no.    Substance Use History: denied.    PMH:       Past Medical History:   Diagnosis Date   ??? Bipolar affective disorder (CMS Dx)    ??? HTN (hypertension)     age 61   ??? Mycosis fungoides (CMS Dx)      I have reviewed the past medical history.  Additional history obtained: no    Social History:    Work History:  disability    Social History     Socioeconomic History   ??? Marital status: Legally Separated     Spouse name: None   ??? Number of children: None   ??? Years of education: None   ??? Highest education level: None   Occupational History   ??? None   Social Needs   ??? Financial resource strain: None   ??? Food insecurity:     Worry: None     Inability: None   ??? Transportation needs:     Medical: None     Non-medical:  None   Tobacco Use   ??? Smoking status: Never Smoker   ??? Smokeless tobacco: Never Used   Substance and Sexual Activity   ??? Alcohol use: Not Currently     Frequency: Never   ??? Drug use: Not Currently   ??? Sexual activity: Not Currently     Partners: Female   Lifestyle   ??? Physical activity:     Days per week: None     Minutes per session: None   ??? Stress: None   Relationships   ??? Social connections:     Talks on phone: None     Gets together: None     Attends religious service: More than 4 times per year     Active member of club or organization: None     Attends meetings of clubs or organizations:  None     Relationship status: Separated   ??? Intimate partner violence:     Fear of current or ex partner: None     Emotionally abused: None     Physically abused: None     Forced sexual activity: None   Other Topics Concern   ??? Caffeine Use Yes   ??? Occupational Exposure No   ??? Exercise Yes   ??? Seat Belt Yes   Social History Narrative   ??? None     I have reviewed the past social history.  Additional history obtained: no.    Family History:    Family History   Problem Relation Age of Onset   ??? Suicidality Father    ??? Hyperlipidemia Mother    ??? Depression Sister    ??? Depression Brother    ??? Depression Maternal Grandfather      I have reviewed the past family history.  Additional history obtained: no.    Medications:  Previous Medications    AMLODIPINE (NORVASC) 10 MG TABLET    Take 10 mg by mouth daily. Indications: hypertension    ARIPIPRAZOLE ([name]) 15 MG TABLET        BLOOD SUGAR DIAGNOSTIC STRP    1 strip by MISCELLANEOUS route daily.    BLOOD-GLUCOSE METER KIT    Test once a day    CALCIUM CITRATE-VITAMIN D3 1,000 MG-400 UNIT/30 ML LIQD    Take 1 tablet by mouth daily.    DOXAZOSIN (CARDURA) 8 MG TABLET    Take 8 mg by mouth at bedtime. Indications: hypertension    LORATADINE (CLARITIN) 10 MG TABLET    Take 10 mg by mouth daily.    METHYLSULFONYLMETHANE (MSM) 1,000 MG CAP    Take 1,000 mg by mouth 2 times a day.     MULTIVITAMIN WITH MINERALS (BEE-ZEE) TABLET    Take 1 tablet by mouth daily.    TRIAMCINOLONE (KENALOG) 0.025 % OINTMENT    Apply 300 g topically 3 times a day. To areas of rash height 71 inches weight 238 lb- please dispense large tub       Allergies:   Allergies as of 12/21/2017 - Fully Reviewed 12/21/2017   Allergen Reaction Noted   ??? Topamax [topiramate]  11/13/2017   ??? Zyprexa [olanzapine]  11/13/2017       Review of Systems     Review of Systems   Constitutional: Negative for activity change.   HENT: Negative for facial swelling.    Eyes: Negative for discharge.   Respiratory: Negative for apnea.    Cardiovascular: Negative for chest pain.   Gastrointestinal: Negative for abdominal distention.   Genitourinary: Negative for difficulty urinating.   Musculoskeletal: Negative for arthralgias.   Skin: Negative for color change.   Psychiatric/Behavioral: Negative for agitation.         Physical Exam/Objective Data     ED Triage Vitals   Vital Signs Group      Temp 12/21/17 2049 97.8 ??F (36.6 ??C)      Temp src --       Heart Rate 12/21/17 2049 90      Heart Rate Source --       Resp 12/21/17 2049 16      SpO2 12/21/17 2049 96 %      BP 12/21/17 2048 141/88      MAP (mmHg) --       BP Location 12/21/17 2048 Right arm      BP Method 12/21/17 2048  Automatic      Patient Position 12/21/17 2048 Sitting   SpO2 12/21/17 2049 96 %   O2 Device --        Physical Exam    Mental Status Exam:     Gait and Muscle Strength:  Normal  Appearance and Behavior: Calm and Cooperative      Groomed  Speech: NL articulation, prosody, volume and production  Language: Naming intact  Mood: euthymic  Affect: appropriate  Thought Process and Associations: goal directed       No loose associations  Thought Content: denies hi/si    Abnormal or psychotic thoughts: None  Orientation: person, place, time/date and situation  Memory: recent, remote, and immediate recall intact  Attention and Concentration: intact  Abstraction: Abstraction normal  Fund  of Knowledge: average  Insight and Judgement: Full     Good        Labs:    Please see electronic medical record for any tests performed in the ED.    No results found for this or any previous visit (from the past 24 hour(s)).    Radiology and EKG:  No results found.    EKG: Please see electronic medical record for any studies performed in the ED.    Emergency Course and Plan     Braden Cimo is a 42 y.o. male who presented to the emergency department with Medication Problem           Diagnosis:    Primary psychiatric Diagnosis: Bipolar I manic  Other psychiatric Diagnoses: none  Substance Use Diagnoses: none  Medical Diagnoses: htn      Disposition:      Discharged from the ED. See AVS for prescriptions, followup, and discharge instructions.  No emergency medical condition present at discharge.  Patient not deemed to be an imminent threat of harm to self or others.  Patient has a good safety plan and discharge disposition in place. Protective factors: Support System.   Summary of rationale for disposition: patient shows no sign of danger and is showing very few signs of illness.  .    Provider completing note: Attending.    Patient was in [place].     Patient had a completed Statement of Belief during this encounter:no    Medications given in PES: no.  Medications prescribed for home or inpatient use: yes.  Laboratory work ordered: no.  Other diagnostic studies ordered: no.  Old and/or outside medical records reviewed: yes.  Collateral information contacted: no.  Patient's outside provider contacted: no.         Philis Kendall, MD  12/21/17 2121

## 2017-12-22 ENCOUNTER — Inpatient Hospital Stay
Admit: 2017-12-22 | Discharge: 2017-12-22 | Disposition: A | Discharge status: Home / Self Care | Payer: Medicare (Managed Care)

## 2017-12-22 ENCOUNTER — Encounter: Payer: Medicare (Managed Care) | Attending: Sports Medicine

## 2018-01-08 ENCOUNTER — Inpatient Hospital Stay: Admit: 2018-01-08 | Payer: Medicare (Managed Care)

## 2018-01-08 ENCOUNTER — Ambulatory Visit: Admit: 2018-01-08 | Discharge: 2018-01-08 | Payer: Medicare (Managed Care) | Attending: Sports Medicine

## 2018-01-08 DIAGNOSIS — S83242A Other tear of medial meniscus, current injury, left knee, initial encounter: Secondary | ICD-10-CM

## 2018-01-08 NOTE — Unmapped (Signed)
This office note has been dictated.

## 2018-01-08 NOTE — Unmapped (Signed)
Addended byRedmond School on: 01/08/2018 08:57 AM     Modules accepted: Orders

## 2018-01-08 NOTE — Unmapped (Signed)
Addended by: Kipp Laurence on: 01/08/2018 09:24 AM     Modules accepted: Orders

## 2018-01-08 NOTE — Unmapped (Signed)
Orthopaedic Surgery Clinic Note    Chief Complaint:  Left knee pain    Subjective:   Nathaniel Maxwell is a 43 y.o. male with left knee pain.  Injured his knee back in October 2019 in [place], where he was living at the time.  Had an MRI done.  Moved to [place] shortly afterwards and is still having persistent knee pain with activity and weight bearing.  Would like to get treatment for it.  Has tried getting cortisone injections while he was in [place], not much relief.  Has not tried PT yet.    ROS: All others negative except for as stated in HPI    The following portions of the patient's history were reviewed and updated as appropriate: allergies, current medications, past medical history, past surgical history, and past social history.    Objective:  Vitals:    01/08/18 0811   Weight: (!) 230 lb (104.3 kg)   Height: 5' 8 (1.727 m)       Labs:  No results for input(s): HGB, HCT, WBC, ESR, CRP, INR, PTT, NA, K in the last 72 hours.    Invalid input(s): PT        Physical Exam:  General: NAD  Cardiopulmonary: unlabored respirations  MSK:   LLE:   No significant varus/valgus deformity of knee on standing    Stable to varus/valgus testing, negative anterior drawer test, stable lachman   Positive McMurray testing   Vascular: DP and PT pulses 2+   Strength: 5/5 quadriceps, hamstrings, gastroc/soleus, anterior tibialis, EHL, FHL   Sensation: sural, saphenous, tibial, deep peroneal, superficial peroneal nerves intact      Imaging:  Reviewed L knee MRI which shows a medial meniscus tear.  ACL/PCL intact, lateral meniscus intact, no obvious cartilage lesions.  There is some cartilage thinning in the medial compartment.     Assessment:  Nathaniel Maxwell is a 43 y.o. male with L knee medial meniscus tear    Plan:  1. Discussed surgical and nonsurgical management options  2. Will pursue conservative treatment with NSAIDs, PT  3. Answered all of the patient's questions to his/her satisfaction and understanding, and he/she is in  agreement with the plan.   4. Follow-up in 4 weeks for reassessment.  If still not doing well, will consider knee arthroscopy      Domenic Polite MD  ORTHOPEDIC SURGERY  01/08/2018 8:27 AM

## 2018-02-16 ENCOUNTER — Encounter: Payer: Medicare (Managed Care) | Attending: Student in an Organized Health Care Education/Training Program

## 2018-03-07 ENCOUNTER — Ambulatory Visit: Admit: 2018-03-07 | Payer: Medicare (Managed Care)

## 2018-03-07 DIAGNOSIS — R7303 Prediabetes: Secondary | ICD-10-CM

## 2018-03-07 LAB — POC HEMOGLOBIN A1C: POC Hemoglobin A1C: 6.5 % — ABNORMAL HIGH (ref 4.0–5.6)

## 2018-03-07 MED ORDER — metFORMIN (GLUCOPHAGE-XR) 500 MG 24 hr tablet
500 | ORAL_TABLET | Freq: Every day | ORAL | 6 refills | Status: AC
Start: 2018-03-07 — End: 2018-08-07

## 2018-03-07 NOTE — Unmapped (Signed)
History of Present Illness:   Nathaniel Maxwell is a 43 y.o. male    Pre visit planning has been done.     Hypertension   This is a chronic problem. The current episode started more than 1 year ago. The problem has been gradually improving since onset. The problem is controlled. Pertinent negatives include no chest pain or shortness of breath. Risk factors for coronary artery disease include male gender and diabetes mellitus. Past treatments include calcium channel blockers and alpha 1 blockers.        Diabetes mellitus-checking bs, regularly in December but then sporadically-103-137. Eats 2 honeybuns daily and loves sweets  Left knee medial meniscal tear--referred for PT but did not want to do it. Pt thinks he usually does well       Health Maintenance Due   Topic Date Due   ??? Hepatitis C Screening (MyChart)  03-Aug-1975   ??? Depression Monitoring (PHQ-9)  16-Feb-1973   ??? Annual Medicare Wellness Visit  31-Mar-1973   ??? Comprehensive Physical Exam  1973/08/23   ??? Alcohol Misuse Screening  03/09/1991   ??? HIV Screening  03/09/1991   ??? Immunization: DTaP/Tdap/Td (1 - Tdap) 03/08/1992       Histories:     He has a past medical history of Bipolar affective disorder (CMS Dx), HTN (hypertension), and Mycosis fungoides (CMS Dx).    He has a past surgical history that includes Knee arthroscopy w/ ACL reconstruction.    His family history includes Depression in his brother, maternal grandfather, and sister; Hyperlipidemia in his mother; Suicidality in his father.    He reports that he has never smoked. He has never used smokeless tobacco. He reports previous alcohol use. He reports previous drug use.      Review of Systems   Constitutional: Positive for weight gain. Negative for weight loss.   Respiratory: Negative for shortness of breath.    Cardiovascular: Negative for chest pain.   Musculoskeletal: Positive for arthralgias (left knee pain).       Allergies:   Topamax [topiramate] and Zyprexa [olanzapine]    Medications:      Outpatient Encounter Medications as of 03/07/2018   Medication Sig Dispense Refill   ??? amLODIPine (NORVASC) 10 MG tablet Take 10 mg by mouth daily. Indications: hypertension     ??? ARIPiprazole ([name]) 15 MG tablet      ??? blood sugar diagnostic Strp 1 strip by MISCELLANEOUS route daily. 30 each 3   ??? blood-glucose meter kit Test once a day 1 kit 0   ??? calcium citrate-vitamin D3 1,000 mg-400 unit/30 mL Liqd Take 1 tablet by mouth daily.     ??? doxazosin (CARDURA) 8 MG tablet Take 8 mg by mouth at bedtime. Indications: hypertension     ??? loratadine (CLARITIN) 10 mg tablet Take 10 mg by mouth daily.     ??? methylsulfonylmethane (MSM) 1,000 mg Cap Take 1,000 mg by mouth 2 times a day.     ??? multivitamin with minerals (BEE-ZEE) tablet Take 1 tablet by mouth daily.     ??? QUEtiapine (SEROQUEL) 100 MG tablet Take 4 tablets (400 mg total) by mouth at bedtime. Indications: CALM MIND 120 tablet 1   ??? triamcinolone (KENALOG) 0.025 % ointment Apply 300 g topically 3 times a day. To areas of rash height 71 inches weight 238 lb- please dispense large tub       No facility-administered encounter medications on file as of 03/07/2018.  Objective:         Vitals:    03/07/18 0840   BP: 137/74   Pulse: 71   Temp: 97.6 ??F (36.4 ??C)   SpO2: 100%    Physical Exam  Vitals signs and nursing note reviewed.   HENT:      Head: Normocephalic and atraumatic.   Neck:      Musculoskeletal: Normal range of motion and neck supple.      Thyroid: No thyromegaly.      Vascular: No carotid bruit.   Cardiovascular:      Rate and Rhythm: Normal rate and regular rhythm.      Heart sounds: Normal heart sounds.   Pulmonary:      Effort: Pulmonary effort is normal. No respiratory distress.      Breath sounds: Normal breath sounds. No wheezing or rales.   Skin:     General: Skin is warm.      Comments: Diffuse skin changes   Neurological:      Mental Status: He is alert.            Assessment:    Please see below     Plan:      htn--Patient has decent  control of his blood pressure.continue meds      Diabetes mellitus--a1c--6.5, discussed food choices, exercise. Agreed to start metformin.referred to diabetic educator      Alcohol misuse screening: performed, negative    lft knee pain--partial meniscal tear, pt thinks he is functioning well currently.   BAD---sees therapist weekly. Pt much less

## 2018-03-21 NOTE — Unmapped (Signed)
Made app for June 1st at 10:30 am.   Will mail Diabetes Assessment approx 2 weeks before app.    Alonna Minium, RN, BSN, CDE  Nurse Clinician, Diabetes  [facility], 2nd floor  Diabetes Educator  [phone]

## 2018-05-23 NOTE — Unmapped (Signed)
Calling to change pts app to a televisit on June 1st.    Pt agreed to phone visit.   Pt asked to do 1 day food log and log BG; pt agreed.    Alonna Minium, RN, BSN, CDE  Nurse Clinician, Diabetes  [facility], 2nd floor  Diabetes Educator  [phone]

## 2018-06-04 ENCOUNTER — Ambulatory Visit: Payer: Medicare (Managed Care)

## 2018-06-13 ENCOUNTER — Ambulatory Visit: Admit: 2018-06-13 | Discharge: 2018-06-29 | Payer: Medicare (Managed Care)

## 2018-06-13 DIAGNOSIS — E119 Type 2 diabetes mellitus without complications: Secondary | ICD-10-CM

## 2018-06-13 NOTE — Unmapped (Signed)
History of Present Illness:   Nathaniel Maxwell is a 43 y.o. male    Pre visit planning has been done.   telemedicine  HPI   Diabetes,htn,BAD   left knee pan--doing well per pt. Not walking as much  Diabetes-taking metformin. Not checking fsbs.polydipsia, no polyuria.  No allergy problems, has not needed loratadine.  htn-does not check bp at home. Denies sob  Health Maintenance Due   Topic Date Due   ??? Hepatitis C Screening (MyChart)  19-Nov-1975   ??? Depression Monitoring (PHQ-9)  08-17-73   ??? Annual Medicare Wellness Visit  1973-12-24   ??? Comprehensive Physical Exam  June 10, 1973   ??? Immunization: Pneumococcal (1 of 1 - PPSV23) 03/09/1979   ??? HIV Screening  03/09/1991   ??? Immunization: DTaP/Tdap/Td (1 - Tdap) 03/08/1992   ??? Immunization: Hepatitis B (1 of 3 - Risk 3-dose series) 03/08/1992   ??? Urine Albumin/Creatinine Ratio  03/07/2018   ??? Diabetic Foot Exam (MyChart)  03/07/2018   ??? Diabetic Eye Exam (MyChart)  03/07/2018     Looking to move to own apartment in senior building  Histories:     He has a past medical history of Bipolar affective disorder (CMS Dx), HTN (hypertension), and Mycosis fungoides (CMS Dx).    He has a past surgical history that includes Knee arthroscopy w/ ACL reconstruction.    His family history includes Depression in his brother, maternal grandfather, and sister; Hyperlipidemia in his mother; Suicidality in his father.    He reports that he has never smoked. He has never used smokeless tobacco. He reports previous alcohol use. He reports previous drug use.      Review of Systems   HENT: Positive for rhinorrhea.    Eyes: Negative for itching.   Cardiovascular: Negative for chest pain.   Neurological: Negative for headaches.       Allergies:   Topamax [topiramate] and Zyprexa [olanzapine]    Medications:     Outpatient Encounter Medications as of 06/13/2018   Medication Sig Dispense Refill   ??? amLODIPine (NORVASC) 10 MG tablet Take 10 mg by mouth daily. Indications: hypertension     ???  ARIPiprazole ([name]) 15 MG tablet      ??? blood sugar diagnostic Strp 1 strip by MISCELLANEOUS route daily. 30 each 3   ??? calcium citrate-vitamin D3 1,000 mg-400 unit/30 mL Liqd Take 1 tablet by mouth daily.     ??? doxazosin (CARDURA) 8 MG tablet Take 8 mg by mouth at bedtime. Indications: hypertension     ??? loratadine (CLARITIN) 10 mg tablet Take 10 mg by mouth daily.     ??? metFORMIN (GLUCOPHAGE-XR) 500 MG 24 hr tablet Take 1 tablet (500 mg total) by mouth daily with breakfast. 30 tablet 6   ??? methylsulfonylmethane (MSM) 1,000 mg Cap Take 1,000 mg by mouth 2 times a day.     ??? multivitamin with minerals (BEE-ZEE) tablet Take 1 tablet by mouth daily.     ??? QUEtiapine (SEROQUEL) 100 MG tablet Take 4 tablets (400 mg total) by mouth at bedtime. Indications: CALM MIND 120 tablet 1   ??? triamcinolone (KENALOG) 0.025 % ointment Apply 300 g topically 3 times a day. To areas of rash height 71 inches weight 238 lb- please dispense large tub       No facility-administered encounter medications on file as of 06/13/2018.         Objective:           Physical Exam  None as  telemedicine      Assessment:    Please see below     Plan:      Diabetes mellitus--last a1c--6.5. pt on metformin. Needs foot exam  htn--continue amlodipine    Return 2 mos    This was a Phone conversation, in lieu of an in-person visit. The patient provided verbal consent to participate in the telehealth visit.   I spent 12 minutes speaking with the patient, conducting an interview, performing a limited exam, and educating the patient on my assessment and plan. I also spent 5 minutes, on the same day as the encounter, preparing to see the patient (eg, review of tests), documenting clinical information in the electronic or other health record and performing non-face-to-face activities.

## 2018-07-19 ENCOUNTER — Emergency Department: Admit: 2018-07-20 | Payer: Medicare (Managed Care)

## 2018-07-19 ENCOUNTER — Emergency Department: Payer: Medicare (Managed Care)

## 2018-07-19 ENCOUNTER — Ambulatory Visit: Payer: Medicare (Managed Care)

## 2018-07-19 DIAGNOSIS — F3010 Manic episode without psychotic symptoms, unspecified: Secondary | ICD-10-CM

## 2018-07-19 MED ORDER — droperidoL (INAPSINE) injection 1.25 mg
2.5 | Freq: Once | INTRAMUSCULAR | Status: AC
Start: 2018-07-19 — End: 2018-07-19
  Administered 2018-07-20: 01:00:00 1.25 mg via INTRAMUSCULAR

## 2018-07-19 MED ORDER — haloperidol lactate (HALDOL) injection 5 mg
5 | Freq: Once | INTRAMUSCULAR | Status: AC
Start: 2018-07-19 — End: 2018-07-19
  Administered 2018-07-20: 01:00:00 5 mg via INTRAMUSCULAR

## 2018-07-19 MED ORDER — midazolam (PF) (VERSED) injection 5 mg
5 | Freq: Once | INTRAMUSCULAR | Status: AC
Start: 2018-07-19 — End: 2018-07-19

## 2018-07-19 MED ORDER — bacitracin-polymyxin b (POLYSPORIN) ointment (packets)
500-10000 | Freq: Two times a day (BID) | TOPICAL | Status: AC
Start: 2018-07-19 — End: 2018-07-20
  Administered 2018-07-20: 06:00:00 1 via TOPICAL

## 2018-07-19 MED ORDER — haloperidol lactate (HALDOL) injection 10 mg
5 | Freq: Once | INTRAMUSCULAR | Status: AC
Start: 2018-07-19 — End: 2018-07-20

## 2018-07-19 MED ORDER — midazolam (PF) (VERSED) 5 mg/mL injection
5 | INTRAMUSCULAR | Status: AC
Start: 2018-07-19 — End: 2018-07-19
  Administered 2018-07-20: 01:00:00 5 via INTRAMUSCULAR

## 2018-07-19 MED ORDER — haloperidol lactate (HALDOL) injection 5 mg
5 | Freq: Once | INTRAMUSCULAR | Status: AC
Start: 2018-07-19 — End: 2018-07-19

## 2018-07-19 MED ORDER — haloperidol lactate (HALDOL) 5 mg/mL injection
5 | INTRAMUSCULAR | Status: AC
Start: 2018-07-19 — End: 2018-07-19
  Administered 2018-07-20: 01:00:00 5 via INTRAMUSCULAR

## 2018-07-19 MED ORDER — haloperidol lactate (HALDOL) 5 mg/mL injection
5 | INTRAMUSCULAR | Status: AC
Start: 2018-07-19 — End: 2018-07-20

## 2018-07-19 MED FILL — HALOPERIDOL LACTATE 5 MG/ML INJECTION SOLUTION: 5 5 mg/mL | INTRAMUSCULAR | Qty: 1

## 2018-07-19 MED FILL — DROPERIDOL 2.5 MG/ML INJECTION SOLUTION: 2.5 2.5 mg/mL | INTRAMUSCULAR | Qty: 2

## 2018-07-19 MED FILL — HALOPERIDOL LACTATE 5 MG/ML INJECTION SOLUTION: 5 5 mg/mL | INTRAMUSCULAR | Qty: 2

## 2018-07-19 MED FILL — MIDAZOLAM (PF) 5 MG/ML INJECTION SOLUTION: 5 5 mg/mL | INTRAMUSCULAR | Qty: 1
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
## 2018-07-19 NOTE — Unmapped (Signed)
ED Attending Attestation Note    Date of service:  07/19/2018    This patient was seen by the resident physician.  I have seen and examined the patient, agree with the workup, evaluation, management and diagnosis. The care plan has been discussed and I concur.  I have reviewed the ECG and concur with the resident's interpretation.    My assessment reveals a 43 y.o. male nontoxic appearing patient.  He is afebrile.  Initially quite tachycardic but also quite combative.  Patient has known psychiatric illness that from reports is in keeping with this.  Patient was tased several times patient an Production assistant, radio.  Bars were all removed completely.  Wound care provider to those areas.  No obvious other significant injury.  EKG is unremarkable.  Patient is much calmer at this time.  Basic screening for her rhabdomyolysis renal failure likely abnormality and a lactate pending at this time.  Patient likely will be reasonable for psychiatric discharge.Marland Kitchen

## 2018-07-19 NOTE — Unmapped (Signed)
Patient agitated and disoriented on arrival to [facility] warranting the use of 4-point non-violent leather restraints. Patient is preforming manic behavior and cannot be redirect or reoriented. Patient is making inappropriate comment towards the male staff and is grabbing at the temperature probe while trying to obtain his temperature. Patient began flopping around the in the bed like a fishing and yelling out. Patient was medicated and placed in 4-point leather restraints.
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
## 2018-07-19 NOTE — Unmapped (Signed)
Talked with MD about placing order for non-violent restraints.

## 2018-07-19 NOTE — Unmapped (Signed)
Xray at bedside

## 2018-07-19 NOTE — Unmapped (Signed)
Patient in 4 point leather restraints to maintain patient safety and protect medical devices. Will continue to monitor and reassess need for restraints. See restraint flowsheet for further documentation.

## 2018-07-19 NOTE — Unmapped (Signed)
Patient brought to [facility] by EMS in a manic episode. Patient is continuously making inappropriate comment to male staff. Patient was transferred into a hospital bed and given 5 of haldol, 5 of versed and 1.25 of droperidol. Patient was placed in 4-point leather restraints. Patient on the monitor, VSS.

## 2018-07-19 NOTE — Unmapped (Deleted)
History of Present Illness:   Nathaniel Maxwell is a 43 y.o. male  Pre visit planning has been done.     HPI   BAD with mania       Histories:     He has a past medical history of Bipolar affective disorder (CMS Dx), HTN (hypertension), and Mycosis fungoides (CMS Dx).    He has a past surgical history that includes Knee arthroscopy w/ ACL reconstruction.    His family history includes Depression in his brother, maternal grandfather, and sister; Hyperlipidemia in his mother; Suicidality in his father.    He reports that he has never smoked. He has never used smokeless tobacco. He reports previous alcohol use. He reports previous drug use.      Review of Systems    Allergies:   Topamax [topiramate] and Zyprexa [olanzapine]    Medications:     Outpatient Encounter Medications as of 07/19/2018   Medication Sig Dispense Refill   ??? amLODIPine (NORVASC) 10 MG tablet Take 10 mg by mouth daily. Indications: hypertension     ??? blood sugar diagnostic Strp 1 strip by MISCELLANEOUS route daily. 30 each 3   ??? calcium citrate-vitamin D3 1,000 mg-400 unit/30 mL Liqd Take 1 tablet by mouth daily.     ??? desvenlafaxine succinate (PRISTIQ) 50 MG 24 hr tablet      ??? doxazosin (CARDURA) 8 MG tablet Take 8 mg by mouth at bedtime. Indications: hypertension     ??? loratadine (CLARITIN) 10 mg tablet Take 10 mg by mouth daily.     ??? metFORMIN (GLUCOPHAGE-XR) 500 MG 24 hr tablet Take 1 tablet (500 mg total) by mouth daily with breakfast. 30 tablet 6   ??? methylsulfonylmethane (MSM) 1,000 mg Cap Take 1,000 mg by mouth 2 times a day.     ??? multivitamin with minerals (BEE-ZEE) tablet Take 1 tablet by mouth daily.     ??? OXcarbazepine (TRILEPTAL) 600 MG tablet      ??? QUEtiapine (SEROQUEL) 100 MG tablet Take 4 tablets (400 mg total) by mouth at bedtime. Indications: CALM MIND 120 tablet 1   ??? sertraline (ZOLOFT) 50 MG tablet Take 50 mg by mouth daily.     ??? triamcinolone (KENALOG) 0.025 % ointment Apply 300 g topically 3 times a day. To areas of rash  height 71 inches weight 238 lb- please dispense large tub       No facility-administered encounter medications on file as of 07/19/2018.         Objective:           Physical Exam       Assessment:      Please see below   Plan:

## 2018-07-19 NOTE — Unmapped (Signed)
PSW made aware of patient and chart review in progress.    Debbie Kaitland Lewellyn, LISW-S  [phone]

## 2018-07-20 ENCOUNTER — Inpatient Hospital Stay
Admit: 2018-07-20 | Discharge: 2018-07-20 | Disposition: A | Discharge status: Other Acute Inpatient Hospital | Payer: Medicare (Managed Care)

## 2018-07-20 ENCOUNTER — Emergency Department: Admit: 2018-07-20 | Payer: Medicare (Managed Care)

## 2018-07-20 ENCOUNTER — Inpatient Hospital Stay
Admission: EM | Admit: 2018-07-20 | Discharge: 2018-08-07 | Disposition: A | Discharge status: Home / Self Care | Payer: Medicare (Managed Care) | Admitting: Psychiatry

## 2018-07-20 DIAGNOSIS — F3164 Bipolar disorder, current episode mixed, severe, with psychotic features: Secondary | ICD-10-CM

## 2018-07-20 LAB — HEPATIC FUNCTION PANEL
ALT: 23 U/L (ref 7–52)
AST: 43 U/L — ABNORMAL HIGH (ref 13–39)
Albumin: 4.6 g/dL (ref 3.5–5.7)
Alkaline Phosphatase: 84 U/L (ref 36–125)
Bilirubin, Direct: 0.1 mg/dL (ref 0.00–0.40)
Bilirubin, Indirect: 0.5 mg/dL (ref 0.00–1.10)
Total Bilirubin: 0.6 mg/dL (ref 0.0–1.5)
Total Protein: 7.7 g/dL (ref 6.4–8.9)

## 2018-07-20 LAB — LIPID PANEL
Cholesterol, Total: 215 mg/dL (ref 0–200)
HDL: 76 mg/dL (ref 60–92)
LDL Cholesterol: 133 mg/dL
Triglycerides: 30 mg/dL (ref 10–149)

## 2018-07-20 LAB — BASIC METABOLIC PANEL
Anion Gap: 13 mmol/L (ref 3–16)
BUN: 20 mg/dL (ref 7–25)
CO2: 25 mmol/L (ref 21–33)
Calcium: 9.9 mg/dL (ref 8.6–10.3)
Chloride: 102 mmol/L (ref 98–110)
Creatinine: 1 mg/dL (ref 0.60–1.30)
Glucose: 113 mg/dL (ref 70–100)
Osmolality, Calculated: 293 mOsm/kg (ref 278–305)
Potassium: 4.3 mmol/L (ref 3.5–5.3)
Sodium: 140 mmol/L (ref 133–146)
eGFR AA CKD-EPI: >90 See note.
eGFR NONAA CKD-EPI: >90 See note.

## 2018-07-20 LAB — MAGNESIUM: Magnesium: 2.1 mg/dL (ref 1.5–2.5)

## 2018-07-20 LAB — URINE DRUG SCREEN WITHOUT CONFIRMATION, STAT
Amphetamine, 500 ng/mL Cutoff: NEGATIVE
Barbiturates UR, 300  ng/mL Cutoff: NEGATIVE
Benzodiazepines UR, 300 ng/mL Cutoff: POSITIVE
Buprenorphine, 5 ng/mL Cutoff: NEGATIVE
Cocaine UR, 300 ng/mL Cutoff: NEGATIVE
Fentanyl, 2 ng/mL Cutoff: NEGATIVE
Methadone, UR, 300 ng/mL Cutoff: NEGATIVE
Opiates UR, 300 ng/mL Cutoff: NEGATIVE
Oxycodone, 100 ng/mL Cutoff: NEGATIVE
THC UR, 50 ng/mL Cutoff: NEGATIVE
Tricyclic Antidepressants, 300 ng/mL Cutoff: NEGATIVE

## 2018-07-20 LAB — CBC
Hematocrit: 36.6 % — ABNORMAL LOW (ref 38.5–50.0)
Hemoglobin: 11.8 g/dL — ABNORMAL LOW (ref 13.2–17.1)
MCH: 25.5 pg — ABNORMAL LOW (ref 27.0–33.0)
MCHC: 32.4 g/dL (ref 32.0–36.0)
MCV: 78.8 fL — ABNORMAL LOW (ref 80.0–100.0)
MPV: 7.4 fL — ABNORMAL LOW (ref 7.5–11.5)
Platelets: 299 10E3/uL (ref 140–400)
RBC: 4.64 10E6/uL (ref 4.20–5.80)
RDW: 15.5 % — ABNORMAL HIGH (ref 11.0–15.0)
WBC: 10.6 10E3/uL (ref 3.8–10.8)

## 2018-07-20 LAB — DIFFERENTIAL
Basophils Absolute: 32 /uL (ref 0–200)
Basophils Relative: 0.3 % (ref 0.0–1.0)
Eosinophils Absolute: 21 /uL (ref 15–500)
Eosinophils Relative: 0.2 % (ref 0.0–8.0)
Lymphocytes Absolute: 1473 /uL (ref 850–3900)
Lymphocytes Relative: 13.9 % (ref 15.0–45.0)
Monocytes Absolute: 1081 /uL (ref 200–950)
Monocytes Relative: 10.2 % (ref 0.0–12.0)
Neutrophils Absolute: 7992 /uL — ABNORMAL HIGH (ref 1500–7800)
Neutrophils Relative: 75.4 % (ref 40.0–80.0)
nRBC: 0 /100 WBC (ref 0–0)

## 2018-07-20 LAB — 2019 NOVEL CORONAVIRUS (COVID-19), NAA-B: SARS-CoV-2: NOT DETECTED

## 2018-07-20 LAB — OXCARBAZEPINE LEVEL: Oxcarbazepine: 5 ug/mL (ref 10–35)

## 2018-07-20 LAB — HEMOGLOBIN A1C: Hemoglobin A1C: 6.5 % — ABNORMAL HIGH (ref 4.0–5.6)

## 2018-07-20 LAB — LACTIC ACID, VENOUS, WHOLE BLOOD, UCMC: Lactate, Ven: 1.5 mmol/L (ref 0.5–2.2)

## 2018-07-20 LAB — POC GLU MONITORING DEVICE: POC Glucose Monitoring Device: 133 mg/dL — ABNORMAL HIGH (ref 70–100)

## 2018-07-20 LAB — CK
Total CK: 384 U/L (ref 30–223)
Total CK: 1179 U/L (ref 30–223)

## 2018-07-20 MED ORDER — QUEtiapine (SEROQUEL) tablet 200 mg
200 | Freq: Two times a day (BID) | ORAL | Status: AC
Start: 2018-07-20 — End: 2018-07-21
  Administered 2018-07-20 – 2018-07-21 (×3): 200 mg via ORAL

## 2018-07-20 MED ORDER — amLODIPine (NORVASC) tablet 10 mg
10 | Freq: Every day | ORAL | Status: AC
Start: 2018-07-20 — End: 2018-08-07
  Administered 2018-07-20 – 2018-08-07 (×19): 10 mg via ORAL

## 2018-07-20 MED ORDER — LORazepam (ATIVAN) tablet 1 mg
1 | ORAL | Status: AC | PRN
Start: 2018-07-20 — End: 2018-08-07
  Administered 2018-07-21 – 2018-08-05 (×12): 1 mg via ORAL

## 2018-07-20 MED ORDER — risperiDONE M-TAB (RISPERDAL M-TABS) disintegrating tablet 1 mg
1 | Freq: Four times a day (QID) | ORAL | Status: AC | PRN
Start: 2018-07-20 — End: 2018-08-07

## 2018-07-20 MED ORDER — nicotine (polacrilex) (NICORETTE) gum 2 mg
2 | BUCCAL | Status: AC | PRN
Start: 2018-07-20 — End: 2018-08-07

## 2018-07-20 MED ORDER — terazosin (HYTRIN) capsule 10 mg
5 | Freq: Every evening | ORAL | Status: AC
Start: 2018-07-20 — End: 2018-08-07
  Administered 2018-07-21 – 2018-08-07 (×18): 10 mg via ORAL

## 2018-07-20 MED ORDER — metFORMIN (GLUCOPHAGE-XR) 24 hr tablet 500 mg
500 | Freq: Every day | ORAL | Status: AC
Start: 2018-07-20 — End: 2018-08-07
  Administered 2018-07-20 – 2018-08-07 (×19): 500 mg via ORAL

## 2018-07-20 MED ORDER — OXcarbazepine (TRILEPTAL) tablet 600 mg
300 | Freq: Two times a day (BID) | ORAL | Status: AC
Start: 2018-07-20 — End: 2018-07-24
  Administered 2018-07-20 – 2018-07-24 (×8): 600 mg via ORAL

## 2018-07-20 MED ORDER — ziprasidone (GEODON) injection 20 mg
20 | INTRAMUSCULAR | Status: AC | PRN
Start: 2018-07-20 — End: 2018-08-07

## 2018-07-20 MED ORDER — risperiDONE M-TAB (RISPERDAL M-TABS) disintegrating tablet 2 mg
2 | Freq: Two times a day (BID) | ORAL | Status: AC | PRN
Start: 2018-07-20 — End: 2018-08-07
  Administered 2018-07-24: 07:00:00 2 mg via ORAL

## 2018-07-20 MED ORDER — sterile water (PF) Soln 1.2 mL
INTRAMUSCULAR | Status: AC | PRN
Start: 2018-07-20 — End: 2018-08-07

## 2018-07-20 MED ORDER — LORazepam (ATIVAN) injection 2 mg
2 | INTRAMUSCULAR | Status: AC | PRN
Start: 2018-07-20 — End: 2018-08-07
  Administered 2018-07-24: 11:00:00 2 mg via INTRAMUSCULAR

## 2018-07-20 MED ORDER — neomycin-bacitracin-polymyxin (NEOSPORIN) ointment
Freq: Every day | TOPICAL | Status: AC
Start: 2018-07-20 — End: 2018-07-20

## 2018-07-20 MED ORDER — OXcarbazepineTRILEPTALtablet600mg
600 | Freq: Every day | ORAL | Status: AC
Start: 2018-07-20 — End: 2018-07-20

## 2018-07-20 MED ORDER — magnesium hydroxide (MILK OF MAGNESIA) 2,400 mg/10 mL oral suspension 10 mL
2400 | Freq: Two times a day (BID) | ORAL | Status: AC | PRN
Start: 2018-07-20 — End: 2018-08-07
  Administered 2018-07-26 – 2018-07-28 (×3): 10 mL via ORAL

## 2018-07-20 MED ORDER — melatonin Tab 3 mg
3 | Freq: Every evening | ORAL | Status: AC
Start: 2018-07-20 — End: 2018-07-24
  Administered 2018-07-21 – 2018-07-24 (×4): 3 mg via ORAL

## 2018-07-20 MED ORDER — diphth,pertus(acell),tetanus (BOOSTRIX TDAP) 2.5-8-5 Lf-mcg-Lf/0.5mL syringe Syrg 0.5 mL
2.5-8-5 | Freq: Once | INTRAMUSCULAR | Status: AC
Start: 2018-07-20 — End: 2018-07-20
  Administered 2018-07-20: 06:00:00 0.5 mL via INTRAMUSCULAR

## 2018-07-20 MED ORDER — neomycin-bacitracin-polymyxin (NEOSPORIN) ointment
Freq: Every day | TOPICAL | Status: AC
Start: 2018-07-20 — End: 2018-08-07
  Administered 2018-07-21 (×2): via TOPICAL
  Administered 2018-07-22: 12:00:00 28 via TOPICAL
  Administered 2018-07-23 – 2018-08-07 (×12): via TOPICAL

## 2018-07-20 MED ORDER — bismuth subsalicylate (PEPTO-BISMOL) tablet Chew 2 tablet
262 | ORAL | Status: AC | PRN
Start: 2018-07-20 — End: 2018-08-07
  Administered 2018-07-27 – 2018-08-05 (×3): 2 via ORAL

## 2018-07-20 MED ORDER — aluminum & magnesium hydroxide-simethicone (MYLANTA) suspension 15 mL
400-400-40 | Freq: Four times a day (QID) | ORAL | Status: AC | PRN
Start: 2018-07-20 — End: 2018-07-23
  Administered 2018-07-23: 08:00:00 15 mL via ORAL

## 2018-07-20 MED ORDER — sodium chloride 0.9 % 1,000 mL IV fluid
Freq: Once | INTRAVENOUS | Status: AC
Start: 2018-07-20 — End: 2018-07-20
  Administered 2018-07-20: 06:00:00 1000 mL via INTRAVENOUS

## 2018-07-20 MED ORDER — acetaminophen (TYLENOL) tablet 650 mg
325 | Freq: Four times a day (QID) | ORAL | Status: AC | PRN
Start: 2018-07-20 — End: 2018-08-07
  Administered 2018-07-20 – 2018-07-31 (×10): 650 mg via ORAL

## 2018-07-20 MED FILL — AMLODIPINE 10 MG TABLET: 10 10 MG | ORAL | Qty: 1

## 2018-07-20 MED FILL — TYLENOL 325 MG TABLET: 325 325 mg | ORAL | Qty: 2

## 2018-07-20 MED FILL — OXCARBAZEPINE 600 MG TABLET: 600 600 MG | ORAL | Qty: 1

## 2018-07-20 MED FILL — TERAZOSIN 5 MG CAPSULE: 5 5 MG | ORAL | Qty: 2

## 2018-07-20 MED FILL — OXCARBAZEPINE 300 MG TABLET: 300 300 MG | ORAL | Qty: 2

## 2018-07-20 MED FILL — SEROQUEL 200 MG TABLET: 200 200 mg | ORAL | Qty: 1

## 2018-07-20 MED FILL — TRIPLE ANTIBIOTIC 3.5 MG-400 UNIT-5,000 UNIT/GRAM TOPICAL OINTMENT: TOPICAL | Qty: 14.2

## 2018-07-20 MED FILL — BOOSTRIX TDAP 2.5 LF UNIT-8 MCG-5 LF/0.5 ML INTRAMUSCULAR SYRINGE: 2.5-8-5 2.5-8-5 Lf-mcg-Lf/0.5mL | INTRAMUSCULAR | Qty: 0.5

## 2018-07-20 MED FILL — METFORMIN ER 500 MG TABLET,EXTENDED RELEASE 24 HR: 500 500 MG | ORAL | Qty: 1

## 2018-07-20 MED FILL — BACITRACIN-POLYMYXIN B 500 UNIT-10,000 UNIT/GRAM TOPICAL PACKET: 500-10000 500-10,000 unit/gram | TOPICAL | Qty: 1

## 2018-07-20 NOTE — Unmapped (Signed)
Pt starting to wake up calm but confused. Fluids given. Attempting to reduce restraints MD aware and agreeable, pt in 2pt non-violent leather. Will continue to assess the ability to discontinue restraints.

## 2018-07-20 NOTE — Unmapped (Signed)
[facility] TO [facility] TRANSFER ACCEPT NOTE:    I spoke with the emergency room physician regarding this patient, have reviewed their chart, and accepted them for transfer from [facility] to [facility] pending the following: none    Clinical Info: Patient is 43 y.o. male that is presenting to [facility] for manic behavior, got tazzed by police.  Pt transferring to [facility]ES for manic episode.    Relevant medical hx:   Patient Active Problem List    Diagnosis Date Noted   ??? Type 2 diabetes mellitus without complication, without long-term current use of insulin (CMS Dx) 03/07/2018   ??? Chronic pain of left knee 03/07/2018   ??? Essential hypertension 11/13/2017   ??? Seasonal allergic rhinitis due to pollen 11/13/2017     Relevant meds given: 5 Haldol IM x2, droperidol 1.25, Versed 5    Ongoing medical recommendations for acute issues include: no acute issues    Vitals:    07/20/18 0310   BP: (!) 154/101   Pulse: 74   Resp: 21   Temp:    SpO2: 100%     Vital sign abnormalities and plan: HTN,     Relevant labs reviewed, WNL except as noted:     Ronney Honeywell MICHAEL Riddhi Grether

## 2018-07-20 NOTE — Unmapped (Signed)
PES staff called [facility] lab requesting lab for CK be added to blood already in lab.  [facility] lab personal stated the CK will be added.

## 2018-07-20 NOTE — Unmapped (Signed)
Nathaniel Maxwell 43 y.o. male  The reason for medication, risks, benefits, and treatment alternatives of the following medications were discussed with the patient or guardian:   ??? amLODIPine  10 mg Oral Daily 0900   ??? metFORMIN  500 mg Oral Daily with breakfast   ??? QUEtiapine  200 mg Oral BID   ??? terazosin  10 mg Oral Nightly (2100)     aluminum & magnesium hydroxide-simethicone, bismuth subsalicylate, LORazepam, LORazepam, magnesium hydroxide, nicotine (polacrilex), risperiDONE M-TAB **OR** risperiDONE M-TAB, ziprasidone **AND** sterile water  Patient was given the opportunity to ask questions and provided medication specific education as requested. Patient voiced understanding as evidenced by verbalizing reason for medication, risks, and benefits.     Nickalos Petersen MICHAEL Dalayla Aldredge
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
## 2018-07-20 NOTE — Unmapped (Signed)
Patient to CT scan with escort of RN.

## 2018-07-20 NOTE — Unmapped (Signed)
SW Note: SW pt's sister Damein Gaunce [phone]) and provided update regarding admission and provided contact information for unit.    SW will continue to monitor.

## 2018-07-20 NOTE — Unmapped (Signed)
Per pt request for a bed needs met bed P15 given

## 2018-07-20 NOTE — Unmapped (Signed)
Pt lying on left side on bed with eyes open. Pt mumbling answers to questions. Safety maintained and will continue to monitor.

## 2018-07-20 NOTE — Unmapped (Signed)
[id number]    Patient highly visible on unit, spending a great deal of time on the phone.  Social with staff, and peers. Friendly and cooperative. Denies suicidal/homicidal ideation, and auditory/visual hallucinations. Did not participate in groups this shift. No unsafe/inappropriate behaviors observed. Independent with activities of daily living, and practicing adequate hygiene routine. Compliant with all scheduled medication. No PRN medication administered this shift. No complaints voiced. Safety maintained per unit protocol.

## 2018-07-20 NOTE — Unmapped (Signed)
Labs collected and obtained on 2nd attempt in right [facility] and sent down to lab awaiting on results.

## 2018-07-20 NOTE — Unmapped (Signed)
Pt continues to remain calm and cooperative with care. Restraints discontinued at current time. Sitter remains at bedside
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
## 2018-07-20 NOTE — Unmapped (Signed)
PSW arranged transportation for patient to go to [facility] via [facility].  ETA is 4:45 AM.    Iline Oven, MSW, LSW
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
## 2018-07-21 LAB — BASIC METABOLIC PANEL
Anion Gap: 7 mmol/L (ref 3–16)
BUN: 12 mg/dL (ref 7–25)
CO2: 27 mmol/L (ref 21–33)
Calcium: 9.4 mg/dL (ref 8.6–10.3)
Chloride: 102 mmol/L (ref 98–110)
Creatinine: 0.9 mg/dL (ref 0.60–1.30)
Glucose: 131 mg/dL (ref 70–100)
Osmolality, Calculated: 284 mOsm/kg (ref 278–305)
Potassium: 3.6 mmol/L (ref 3.5–5.3)
Sodium: 136 mmol/L (ref 133–146)
eGFR AA CKD-EPI: >90 See note.
eGFR NONAA CKD-EPI: >90 See note.

## 2018-07-21 LAB — CK: Total CK: 1625 U/L (ref 30–223)

## 2018-07-21 MED ORDER — loratadine (CLARITIN) tablet 10 mg
10 | Freq: Every day | ORAL | Status: AC
Start: 2018-07-21 — End: 2018-08-07
  Administered 2018-07-21 – 2018-08-07 (×18): 10 mg via ORAL

## 2018-07-21 MED ORDER — benztropine (COGENTIN) tablet 0.5 mg
0.5 | Freq: Two times a day (BID) | ORAL | Status: AC | PRN
Start: 2018-07-21 — End: 2018-08-07

## 2018-07-21 MED ORDER — QUEtiapine (SEROQUEL) tablet 300 mg
300 | Freq: Two times a day (BID) | ORAL | Status: AC
Start: 2018-07-21 — End: 2018-07-24
  Administered 2018-07-22 – 2018-07-24 (×5): 300 mg via ORAL

## 2018-07-21 MED FILL — SEROQUEL 200 MG TABLET: 200 200 mg | ORAL | Qty: 1

## 2018-07-21 MED FILL — METFORMIN ER 500 MG TABLET,EXTENDED RELEASE 24 HR: 500 500 MG | ORAL | Qty: 1

## 2018-07-21 MED FILL — TYLENOL 325 MG TABLET: 325 325 mg | ORAL | Qty: 2

## 2018-07-21 MED FILL — MELATONIN 3 MG TABLET: 3 3 mg | ORAL | Qty: 1

## 2018-07-21 MED FILL — LORAZEPAM 1 MG TABLET: 1 1 MG | ORAL | Qty: 1

## 2018-07-21 MED FILL — QUETIAPINE 300 MG TABLET: 300 300 MG | ORAL | Qty: 1

## 2018-07-21 MED FILL — OXCARBAZEPINE 300 MG TABLET: 300 300 MG | ORAL | Qty: 2

## 2018-07-21 MED FILL — AMLODIPINE 10 MG TABLET: 10 10 MG | ORAL | Qty: 1

## 2018-07-21 MED FILL — LORATADINE 10 MG TABLET: 10 10 mg | ORAL | Qty: 1

## 2018-07-21 MED FILL — TERAZOSIN 5 MG CAPSULE: 5 5 MG | ORAL | Qty: 2
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
## 2018-07-21 NOTE — Unmapped (Signed)
SW Plan of Care initiated. Dr. Mertha Clyatt Gibson EDD-CI, LMSW, BS.

## 2018-07-21 NOTE — Unmapped (Signed)
Occupational Therapy/Therapeutic Recreation Progress Note      Nathaniel Maxwell  [id number]      Group: Task  Attendance: Attended  20/45 minutes  Level of Participation: Active    Pt arrived to group late this date with unit staff. Pt completed hand washing hygiene task. Pt had a blunted affect and appropriate ADLs. Pt stated he wanted to make bracelets for his granddaughters that are 26 and 45 years old. Pt made minimal progress on task secondary to Pt was looking for specific letters for his bracelets. Pt did have the word Jesus with a heart on each end of the word for one of the bracelets. Pt demonstrated good focus on task. Pt interacted in conversations with staff. Pt did not show any evidence of maniac or unsafe behaviors during this group. Pt demonstrated pleasant and appropriate behaviors towards staff and peers throughout group this date.    Rhae Hammock, COTA/L  07/21/2018 3:05 PM

## 2018-07-21 NOTE — Unmapped (Signed)
Night Shift Nursing Note ([id number])    Nathaniel Maxwell, LOS: 0  <principal problem not specified>    Vital signs in last 24 hours:   Vitals:    07/20/18 2155   BP: (!) 148/92   Pulse:    Resp:    Temp:    SpO2:      Overnight Assessment:  Pt rested in bed, respirations unlabored and even.  Pt awaken from a short sleep, requested and received one time   order to give Melatonin 3 mg given at 0016. Effective at 0116.  Awaken early this morning watching TV.  Safety being maintained per unit protocol.   Slept 4.5 hours. Will continue to monitor.    Ether Griffins, RN  07/20/2018

## 2018-07-21 NOTE — Unmapped (Signed)
Patient not available. Possibly participating in OT. SW will attempt assessment later on this day. Dr. Graciela Husbands EdD-CI, LMSW, BS.

## 2018-07-21 NOTE — Unmapped (Signed)
[id number]:    Patient is very visible on the unit.  Pt is able to voice his needs and has multiple requests.   Calm and cooperative.   Social with select peers and staff. Played cards with another patient.   Denies SI/HI/AVH.   Medication compliant  PRN Ativan 1 mg PO administered at 0759 for pt complaints of anxiety and restlessness. Pt reported that the medication helped him calm down a little. Pt remains manic restless.   1100: Pt reported that he was going to lie down.  Pt has been running Hypertensive today: 142/88 @ 0645, 153/96 at 1559 163/103 at 1700 157/85 @1910 .  1745 Pt c/o headache rated 5/10. POD (Dr. Doreatha Massed) paged b/c it had not yet been 6 hours since his last administration.  Nursing communication put in place, ok to give Tylenol 650 mg, which was administered at 1801, with good effect. Pt's BP was taken again at 1910 and found to be a little lower at 157/85.    1945, pt reports that his head was hurting again. POD (Dr. Marianna Fuss) informed via secure chat of new head pain and BP taken at 1910.
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
## 2018-07-21 NOTE — Unmapped (Signed)
E2: Pt cheerful on phone calls with family.  Ice pack for headache associated with elevated BP. See flowsheets for vitals before and after HS scheduled meds given regarding BP.  Pt stated that he feels psych hospitals do not adequately follow medical conditions such as BP, but that this hospital does better than the one he was at in [place] last week.   Resting in room. Will monitor per protocol.

## 2018-07-21 NOTE — Unmapped (Signed)
Attempted to meet with patient to complete SW Assessment. Patient eating lunch. SW will complete assessment based on chart review.   Dr. Graciela Husbands EdD-CI, LMSW, BS.
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
## 2018-07-22 LAB — HEPATIC FUNCTION PANEL
ALT: 31 U/L (ref 7–52)
AST: 50 U/L (ref 13–39)
Albumin: 4.5 g/dL (ref 3.5–5.7)
Alkaline Phosphatase: 79 U/L (ref 36–125)
Bilirubin, Direct: 0.06 mg/dL (ref 0.00–0.40)
Bilirubin, Indirect: 0.24 mg/dL (ref 0.00–1.10)
Total Bilirubin: 0.3 mg/dL (ref 0.0–1.5)
Total Protein: 7.8 g/dL (ref 6.4–8.9)

## 2018-07-22 LAB — BASIC METABOLIC PANEL
Anion Gap: 7 mmol/L (ref 3–16)
BUN: 10 mg/dL (ref 7–25)
CO2: 30 mmol/L (ref 21–33)
Calcium: 9.8 mg/dL (ref 8.6–10.3)
Chloride: 98 mmol/L (ref 98–110)
Creatinine: 0.84 mg/dL (ref 0.60–1.30)
Glucose: 92 mg/dL (ref 70–100)
Osmolality, Calculated: 279 mOsm/kg (ref 278–305)
Potassium: 3.8 mmol/L (ref 3.5–5.3)
Sodium: 135 mmol/L (ref 133–146)
eGFR AA CKD-EPI: >90 See note.
eGFR NONAA CKD-EPI: >90 See note.

## 2018-07-22 LAB — CK: Total CK: 1960 U/L (ref 30–223)

## 2018-07-22 MED ORDER — hydrALAZINE (APRESOLINE) tablet 10 mg
10 | Freq: Two times a day (BID) | ORAL | Status: AC | PRN
Start: 2018-07-22 — End: 2018-08-07
  Administered 2018-07-22 – 2018-08-05 (×6): 10 mg via ORAL

## 2018-07-22 MED ORDER — cloNIDine HCL (CATAPRES) tablet 0.1 mg
0.1 | Freq: Once | ORAL | Status: AC
Start: 2018-07-22 — End: 2018-07-22

## 2018-07-22 MED FILL — QUETIAPINE 300 MG TABLET: 300 300 MG | ORAL | Qty: 1

## 2018-07-22 MED FILL — LORAZEPAM 1 MG TABLET: 1 1 MG | ORAL | Qty: 1

## 2018-07-22 MED FILL — MELATONIN 3 MG TABLET: 3 3 mg | ORAL | Qty: 1

## 2018-07-22 MED FILL — METFORMIN ER 500 MG TABLET,EXTENDED RELEASE 24 HR: 500 500 MG | ORAL | Qty: 1

## 2018-07-22 MED FILL — LORATADINE 10 MG TABLET: 10 10 mg | ORAL | Qty: 1

## 2018-07-22 MED FILL — OXCARBAZEPINE 300 MG TABLET: 300 300 MG | ORAL | Qty: 2

## 2018-07-22 MED FILL — AMLODIPINE 10 MG TABLET: 10 10 MG | ORAL | Qty: 1

## 2018-07-22 MED FILL — TERAZOSIN 5 MG CAPSULE: 5 5 MG | ORAL | Qty: 2

## 2018-07-22 MED FILL — HYDRALAZINE 10 MG TABLET: 10 10 MG | ORAL | Qty: 1

## 2018-07-22 MED FILL — TYLENOL 325 MG TABLET: 325 325 mg | ORAL | Qty: 2

## 2018-07-22 NOTE — Unmapped (Signed)
Pt visible on the unit, frequent requests, med compliant, manic with pressured speech, restless.     Pt had an elevated BP this morning 163/101, after taking morning medications, BP 163/106 @ 1057, pt now symptomatic with HA, given tylenol 650mg  prn, effect pending. RN paged POD
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
## 2018-07-22 NOTE — Unmapped (Addendum)
1530-assumed care this patient    Patient visible on unit. Polite in interaction. Able to make needs known. Medicine compliant. No management problem tonight.

## 2018-07-22 NOTE — Unmapped (Addendum)
Pt asleep from after snack time and HS med pass on evening shift until 0330. Total sleep time approx 6 hours. HS scheduled Melatonin 3 mg was effective for sleep.   0330 Pt requested and given new pair of disposable underwear.  Pt quietly doing yoga in his room.   0430 Pt refilled his water pitcher at the wall dispenser.  0445 Pt asked RN to write down his idea on his paper for him. Pt reading the Bible.  0530 Pt requested and given new linens to make bed.  0600 Pt on unit watching TV and gave vitals.  No behavioral issues so far. Will monitor per protocol.

## 2018-07-22 NOTE — Unmapped (Signed)
Occupational Therapy/Therapeutic Recreation Progress Note      Toren Tucholski  [id number]      Group: Task  Attendance: Attended  45/45 minutes  Level of Participation: Active    Pt readily attended group upon invitation. Pt completed hand washing hygiene task. Pt had appropriate affect and ADLs. Pt continued on his beaded bracelet task he initiated previous day while listening to music. Pt's task was organized. Pt demonstrated good focus on task. Pt interacted with staff and approached staff to have needs met. Pt thanked staff for group afterwards and offered to cleanup task area. Pt did not demonstrate any manic or unsafe behaviors during this group. Pt demonstrated pleasant and respectful behaviors towards staff and peers throughout group this date.    Rhae Hammock, COTA/L  07/22/2018 2:10 PM

## 2018-07-23 MED ORDER — simethiconeMYLICONchewabletablet80mg
80 | Freq: Four times a day (QID) | ORAL | Status: AC | PRN
Start: 2018-07-23 — End: 2018-08-07
  Administered 2018-07-24 – 2018-08-05 (×6): 80 mg via ORAL

## 2018-07-23 MED ORDER — calcium carbonate (TUMS) chewable tablet 500 mg
200 | Freq: Every day | ORAL | Status: AC | PRN
Start: 2018-07-23 — End: 2018-07-23

## 2018-07-23 MED ORDER — calcium carbonate (TUMS) chewable tablet 500 mg
200 | Freq: Three times a day (TID) | ORAL | Status: AC | PRN
Start: 2018-07-23 — End: 2018-08-07
  Administered 2018-07-23 – 2018-08-02 (×9): 500 mg via ORAL

## 2018-07-23 MED FILL — LORAZEPAM 1 MG TABLET: 1 1 MG | ORAL | Qty: 1

## 2018-07-23 MED FILL — QUETIAPINE 300 MG TABLET: 300 300 MG | ORAL | Qty: 1

## 2018-07-23 MED FILL — CALCIUM ANTACID 200 MG CALCIUM (500 MG) CHEWABLE TABLET: 200 200 mg calcium (500 mg) | ORAL | Qty: 1

## 2018-07-23 MED FILL — SIMETHICONE 80 MG CHEWABLE TABLET: 80 80 MG | ORAL | Qty: 1

## 2018-07-23 MED FILL — OXCARBAZEPINE 300 MG TABLET: 300 300 MG | ORAL | Qty: 2

## 2018-07-23 MED FILL — METFORMIN ER 500 MG TABLET,EXTENDED RELEASE 24 HR: 500 500 MG | ORAL | Qty: 1

## 2018-07-23 MED FILL — MAG-AL PLUS EXTRA STRENGTH 400 MG-400 MG-40 MG/5 ML ORAL SUSPENSION: 400-400-40 400-400-40 mg/5 mL | ORAL | Qty: 30

## 2018-07-23 MED FILL — AMLODIPINE 10 MG TABLET: 10 10 MG | ORAL | Qty: 1

## 2018-07-23 MED FILL — MELATONIN 3 MG TABLET: 3 3 mg | ORAL | Qty: 1

## 2018-07-23 MED FILL — TERAZOSIN 5 MG CAPSULE: 5 5 MG | ORAL | Qty: 2

## 2018-07-23 MED FILL — LORATADINE 10 MG TABLET: 10 10 mg | ORAL | Qty: 1

## 2018-07-23 NOTE — Unmapped (Signed)
[id number]    Patient sleeping. Breathing regular, easy, unlabored. No Scheduled medication. PRN mylanta for indigestion, good effect. Safety maintained per unit protocol.

## 2018-07-23 NOTE — Unmapped (Signed)
Consult received for food preferences. Spoke with pt out on the unit.  States he wants less potatoes. Asked appropriate questions.    Sharene Skeans MS, RD, LD  [phone]

## 2018-07-23 NOTE — Unmapped (Addendum)
Occupational Therapy/Therapeutic Recreation Progress Note      Nathaniel Maxwell  [id number]      Group: Stress Management  Attendance: Attended  45/45 minutes  Level of Participation: Active, Interacted with peer(s), Interacted with staff, Needed Cues to Communicate and Followed Directions    Pt readily attended group upon invitation. Pt participated in stress management group focusing on yoga exercises, breathing techniques, and progressive muscle relaxation. Pt had a euthymic affect during group. Pt was able to attend to 50 % of the group with 50% organization/completion. Pt did seem to become more tired/relaxed throughout the yoga session as pt was seen yawing and closing his eyes. Pt stated, Wow, I could literally go to sleep I'm so relaxed right now. Pt also shared with the group that his relaxation activity is taking a hot shower and working out to sweat the anger away. Pt enjoyed the relaxation activity and expressed interest in handouts to do on his own. Pt provided handouts. Pt had appropriate interaction with staff and peers. Pt did state however, If AH comes in I will have to leave because he said some things that just don't sit right to me. I know better to just walk away. Pt required min prompting from staff to complete group activity.     MARIA GRACE SALOMONE, OT  07/23/2018 2:50 PM

## 2018-07-23 NOTE — Unmapped (Signed)
Occupational Therapy/Therapeutic Recreation Progress Note      Rodman Recupero  [id number]      Group: Project  Attendance: Attended  45/60 minutes  Level of Participation: Moderate    Pt readily attended group upon invitation. Pt completed hand washing hygiene task. Pt had appropriate affect and ADLs. Pt engaged in a coloring task during group. Pt's task was organized. Pt demonstrated focusing on task approx 25% of the time. Pt demonstrated hyper verbal behaviors throughout duration in good. 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 Pt asked Writer multiple times if he could continue to work on his task tomorrow secondary to Pt was too tired. Pt was informed that was fine, although Pt continued with conversations. Pt demonstrated pleasant behaviors towards staff and peers throughout group this date.    Rhae Hammock, COTA/L  07/23/2018 5:41 PM

## 2018-07-23 NOTE — Unmapped (Addendum)
Problem: Major Thought Disorder  Goal: DC Criteria-Thought D/O won't neg. affect daily functioning  Intervention: Assess for target symptoms & provide for safety  Description: Assess for target symptoms and provide for safety  Note: Superficially pleasant. Multiple request. Initially refused Seroquel request to take both doses of Seroquel in the evening. Upset with situation with family took several visitors off his visitors list. Irritable requested Ativan and the Seroquel he refused earlier in the day. Received morning dose of Seroquel 300 mg po  and Ativan 1 mg po prn at 1250 with results pending. Labile mood.   Intervention: Dispense prescribed medication per MD order  Note: Compliant with medication with some hesitation. Initially refused Seroquel. Requested and took Seroquel at 1250.   Intervention: Address personal hygiene and care  Note: Hygiene needs met. Supplies at the bedside.        Good results from ativan 1 mg po prn given at 1250 at 1350.

## 2018-07-23 NOTE — Unmapped (Signed)
Patient requested medication for gas. Psych OD was paged and Dr. Valentina Lucks returned call and made aware of patient's c/o gas. New order received for Simethicone 80 mg. Also informed of elevated CK 1,960 on 7/19. No new orders given. Just to encourage fluids.

## 2018-07-23 NOTE — Unmapped (Addendum)
1500-1930 Disorganized. Multiple request. Intrusive ant times, but some improvement noted from day shift. Made several phone calls. Conversation not as loud on phone. Social with peers. Denies suicidal/homicidal thoughts. Support offered. Safety maintained.     Received 1 tum at 1912 for indigestion with results pending.
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
## 2018-07-23 NOTE — Unmapped (Signed)
Problem: Major Mood Disorder  Goal: DC Criteria-Mania/depression won't neg affect daily function  Intervention: Other psychotherapeutic interventions (describe)  Description: Other psychotherapeutic interventions (describe)  Note: Met with patient in his room. Patient doing exercises in his room. He states that he is connected with [facility]. He sees a NP and has a therapist named Carie Caddy. He doesn't know who his CM is but gives permission for SW to contact [facility]. Patient states that he was living with his sister but cannot go back with her. Says that he doesn't feel loved by here and doesn't feel that his sister here in [name] has his back. Patient says the only family member he gives permission to speak to is his sister Dshaun Reppucci [phone]). Patient says he intends to always be based in [place]. He receives $1600 monthly in SSDI. Denies any SI/HI. Patient pleasant, cooperative.

## 2018-07-23 NOTE — Unmapped (Addendum)
Occupational Therapy/Therapeutic Recreation Progress Note      Nathaniel Maxwell  [id number]      Group: Task  Attendance: Attended  45/45 minutes  Level of Participation: Active, Interacted with peer(s), Interacted with staff and Needed Cues to Communicate    Pt readily attended group upon invitation. Pt had euthymic affect during group. Pt started on a new beading necklace followed by playing 3 round of Uno card game with staff. Pt also sat in rocking chair for vestibular input during group. Pt's task completion was 50% organized. Pt demonstrated 50% focus on task. Pt easily distracted by other peers and consistently wanted to talk with staff. Pt did look up TH information and their Return to Fatherhood program on the computer.  Pt could be hyperverbal at time during group. Pt had appropriate interaction with staff and peers. Pt discussed his 3 nieces and nephews he likes to play games with and spend time with outside. Pt had some disorganized conversation with 2 staff about grand kids and cousins. Pt had something bizarre like Redd[place] MD on his necklace for his niece. Pt discussed chaotic unit this morning with staff about police coming up for 1-2 patients. Pt required assistance to tie off and complete beading task.       MARIA GRACE SALOMONE, OT  07/23/2018 11:45 AM

## 2018-07-24 LAB — POC GLU MONITORING DEVICE: POC Glucose Monitoring Device: 119 mg/dL (ref 70–100)

## 2018-07-24 MED ORDER — diphenhydrAMINE (BENADRYL) 50 mg/mL injection
50 | INTRAMUSCULAR | Status: AC
Start: 2018-07-24 — End: 2018-07-24
  Administered 2018-07-24: 09:00:00 50 via INTRAMUSCULAR

## 2018-07-24 MED ORDER — haloperidol lactate (HALDOL) 5 mg/mL injection
5 | INTRAMUSCULAR | Status: AC
Start: 2018-07-24 — End: 2018-07-24

## 2018-07-24 MED ORDER — QUEtiapine (SEROQUEL) tablet 500 mg
100 | Freq: Every evening | ORAL | Status: AC
Start: 2018-07-24 — End: 2018-07-28
  Administered 2018-07-25 – 2018-07-28 (×4): 500 mg via ORAL

## 2018-07-24 MED ORDER — haloperidol lactate (HALDOL) 5 mg/mL injection
5 | INTRAMUSCULAR | Status: AC
Start: 2018-07-24 — End: 2018-07-24
  Administered 2018-07-24: 11:00:00 10 via INTRAMUSCULAR

## 2018-07-24 MED ORDER — OXcarbazepine (TRILEPTAL) tablet 600 mg
300 | Freq: Every evening | ORAL | Status: AC
Start: 2018-07-24 — End: 2018-07-24
  Administered 2018-07-25: 01:00:00 600 mg via ORAL

## 2018-07-24 MED ORDER — LORazepam (ATIVAN) injection 2 mg
2 | Freq: Four times a day (QID) | INTRAMUSCULAR | Status: AC | PRN
Start: 2018-07-24 — End: 2018-08-07

## 2018-07-24 MED ORDER — diphenhydrAMINE (BENADRYL) injection 50 mg
50 | Freq: Once | INTRAMUSCULAR | Status: AC
Start: 2018-07-24 — End: 2018-07-24

## 2018-07-24 MED ORDER — OXcarbazepine (TRILEPTAL) tablet 300 mg
300 | Freq: Every day | ORAL | Status: AC
Start: 2018-07-24 — End: 2018-07-25
  Administered 2018-07-24 – 2018-07-25 (×2): 300 mg via ORAL

## 2018-07-24 MED ORDER — benztropine mesylate (COGENTIN) 1 mg/mL injection
1 | INTRAMUSCULAR | Status: AC
Start: 2018-07-24 — End: 2018-07-24
  Administered 2018-07-24: 11:00:00 2 via INTRAMUSCULAR

## 2018-07-24 MED ORDER — benztropine mesylate (COGENTIN) injection 2 mg
1 | Freq: Four times a day (QID) | INTRAMUSCULAR | Status: AC | PRN
Start: 2018-07-24 — End: 2018-08-07

## 2018-07-24 MED ORDER — melatonin Tab 6 mg
3 | Freq: Every evening | ORAL | Status: AC
Start: 2018-07-24 — End: 2018-07-26
  Administered 2018-07-25 – 2018-07-26 (×2): 6 mg via ORAL

## 2018-07-24 MED ORDER — haloperidol lactate (HALDOL) injection 10 mg
5 | Freq: Four times a day (QID) | INTRAMUSCULAR | Status: AC | PRN
Start: 2018-07-24 — End: 2018-08-07

## 2018-07-24 MED ORDER — QUEtiapine (SEROQUEL) tablet 300 mg
300 | Freq: Every day | ORAL | Status: AC
Start: 2018-07-24 — End: 2018-07-28
  Administered 2018-07-24 – 2018-07-28 (×5): 300 mg via ORAL

## 2018-07-24 MED ORDER — OXcarbazepine (TRILEPTAL) tablet 300 mg
300 | Freq: Every evening | ORAL | Status: AC
Start: 2018-07-24 — End: 2018-07-26
  Administered 2018-07-26 – 2018-07-27 (×2): 300 mg via ORAL

## 2018-07-24 MED FILL — DIPHENHYDRAMINE 50 MG/ML INJECTION SOLUTION: 50 50 mg/mL | INTRAMUSCULAR | Qty: 1

## 2018-07-24 MED FILL — LORATADINE 10 MG TABLET: 10 10 mg | ORAL | Qty: 1

## 2018-07-24 MED FILL — LORAZEPAM 1 MG TABLET: 1 1 MG | ORAL | Qty: 1

## 2018-07-24 MED FILL — TYLENOL 325 MG TABLET: 325 325 mg | ORAL | Qty: 2

## 2018-07-24 MED FILL — OXCARBAZEPINE 300 MG TABLET: 300 300 MG | ORAL | Qty: 2

## 2018-07-24 MED FILL — TERAZOSIN 5 MG CAPSULE: 5 5 MG | ORAL | Qty: 2

## 2018-07-24 MED FILL — SEROQUEL 100 MG TABLET: 100 100 mg | ORAL | Qty: 1

## 2018-07-24 MED FILL — AMLODIPINE 10 MG TABLET: 10 10 MG | ORAL | Qty: 1

## 2018-07-24 MED FILL — LORAZEPAM 2 MG/ML INJECTION SOLUTION: 2 2 mg/mL | INTRAMUSCULAR | Qty: 1

## 2018-07-24 MED FILL — RISPERIDONE 2 MG DISINTEGRATING TABLET: 2 2 MG | ORAL | Qty: 1

## 2018-07-24 MED FILL — HALOPERIDOL LACTATE 5 MG/ML INJECTION SOLUTION: 5 5 mg/mL | INTRAMUSCULAR | Qty: 1

## 2018-07-24 MED FILL — BENZTROPINE 1 MG/ML INJECTION SOLUTION: 1 1 mg/mL | INTRAMUSCULAR | Qty: 2

## 2018-07-24 MED FILL — METFORMIN ER 500 MG TABLET,EXTENDED RELEASE 24 HR: 500 500 MG | ORAL | Qty: 1

## 2018-07-24 MED FILL — CALCIUM ANTACID 200 MG CALCIUM (500 MG) CHEWABLE TABLET: 200 200 mg calcium (500 mg) | ORAL | Qty: 1

## 2018-07-24 MED FILL — MELATONIN 3 MG TABLET: 3 3 mg | ORAL | Qty: 2

## 2018-07-24 MED FILL — SIMETHICONE 80 MG CHEWABLE TABLET: 80 80 MG | ORAL | Qty: 1

## 2018-07-24 MED FILL — QUETIAPINE 300 MG TABLET: 300 300 MG | ORAL | Qty: 1
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
## 2018-07-24 NOTE — Unmapped (Signed)
Nathaniel Maxwell  [id number]    Restraint and/or Seclusion Violent and Self-Destructive Assessment    Type of Assessment: Re-assessment    Type of measure used? Seclusion    What is patient's immediate situation? Still banging on the door    What was patient's reaction to the intervention? Still not contained    What is the patient's medical and behavioral condition? Very manic and out of control     Were less restrictive measures attempted? Yes    Is the need to continue or terminate the restraint or seclusion? continue

## 2018-07-24 NOTE — Unmapped (Signed)
[phone]  Pt highly visible on unit, social with peers/staff. Pt intrusive, multiple requests, affect cheerful. Reports mood as alright. Appetite good with snacks/drinks. Complaint with scheduled medications. Pt focusing on getting more medications for sleep. Reports anxiety, PRN Ativan 1 mg given with effective result. Catcher complains of flatulence, PRN simethicone 40 mg given, effective. Safety maintained. Will continue to monitor per unit protocol.

## 2018-07-24 NOTE — Unmapped (Signed)
I have approved the emergency use of the PRN order for haloperidol lactate (HALDOL) ativan and cogentin    This decision was made because the patient displayed: Inability to control impulses/behavior    Benetta Spar   07/24/2018  6:26 AM
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
## 2018-07-24 NOTE — Unmapped (Addendum)
[id number]    Patient had the following medications for agitated behavior this shift:    Risperdal 2 mg at  0318  Ativan 1 mg at 0318    Medications effective for short time.    Benadryl 50 mg IM at 0518    Medication ineffective    Ativan 2 mg IM at 0646  Haldol 10 mg IM at 0646  Cogentin 2 mg IM at 0646    Medications effective.     While in special care area, patient observed punching mattress and swinging arms in the air. RTIS. Patient urinated multiple times in the corner of special care area and observed banging and kicking on door.

## 2018-07-24 NOTE — Unmapped (Signed)
Appears to be agitated and is RTIS in special care area.
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
## 2018-07-24 NOTE — Unmapped (Signed)
Patient calling wants MD to know he is in the hospital and missed app due to this. Is currently in [place]. He is angry because his BP is not controlled now that he is in the hospital and they changed his meds. Let him know while he is in the hospital those MD's decide his care. He said they don't know what they're doing.

## 2018-07-24 NOTE — Unmapped (Signed)
Problem: Major Mood Disorder  Goal: DC Criteria-Mania/depression won't neg affect daily function  Intervention: Obtain clinically significant collateral information  Description: Obtain clinically significant collateral information  Note: Spoke with patient's sister Denali Becvar [phone]). She states that patient is going through a tough time right now as he is going through a divorce. The closer it gets to the divorce being finalized the worse the patient is getting. In the past year patient has been admitted 5 times - twice in [place], once in [place] and now twice in [place]. Patient has been manic/irritable most of the time. He will take off places and end up in the hospital there. She is worried about patient being d/c too soon as patient is talking about going to [place], which would not be good for patient. Family is very supportive of patient - sister states that patient will at times be angry with different family members when they try to get him to focus on getting stable. She says patient is very intelligent, was valedictorian of his class. Patient's PCP is Dr. Durwin Nora. Discussed that SW is trying to contact [facility] regarding patient's outpatient services.

## 2018-07-24 NOTE — Unmapped (Signed)
Wean to unit from seclusion at 0830 after patient verbalized understanding of why he was placed in seclusion and understanding of appropriate behavior on the unit. Multiple request. Readily compliant with medication. Updated visitors list because per patient the physician wants to get in touch with his sister who patients says is a physician also. Manageable on unit.  Social with peers. No physical complaints voiced. Support offered. Safety maintained.
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
## 2018-07-24 NOTE — Unmapped (Signed)
1500-1930 Ambulated about on the unit. Affect and mood unpredictable. Remained focused on reasons he was placed in seclusion. Request that he only work with male nurses. No management problems. Encouraged to verbalize his needs. Support offered. Safety maintained.

## 2018-07-24 NOTE — Unmapped (Signed)
Patient with symptoms of hyperglycemia. Increase thirst and urination. Blood glucose checked and it was 119. Will continue to monitor patient.

## 2018-07-25 LAB — BASIC METABOLIC PANEL
Anion Gap: 8 mmol/L (ref 3–16)
BUN: 11 mg/dL (ref 7–25)
CO2: 26 mmol/L (ref 21–33)
Calcium: 9.6 mg/dL (ref 8.6–10.3)
Chloride: 101 mmol/L (ref 98–110)
Creatinine: 0.89 mg/dL (ref 0.60–1.30)
Glucose: 130 mg/dL (ref 70–100)
Osmolality, Calculated: 281 mOsm/kg (ref 278–305)
Potassium: 4.2 mmol/L (ref 3.5–5.3)
Sodium: 135 mmol/L (ref 133–146)
eGFR AA CKD-EPI: >90 See note.
eGFR NONAA CKD-EPI: >90 See note.

## 2018-07-25 LAB — CK: Total CK: 1155 U/L (ref 30–223)

## 2018-07-25 MED FILL — TERAZOSIN 5 MG CAPSULE: 5 5 MG | ORAL | Qty: 2

## 2018-07-25 MED FILL — LORATADINE 10 MG TABLET: 10 10 mg | ORAL | Qty: 1

## 2018-07-25 MED FILL — CALCIUM ANTACID 200 MG CALCIUM (500 MG) CHEWABLE TABLET: 200 200 mg calcium (500 mg) | ORAL | Qty: 1

## 2018-07-25 MED FILL — LORAZEPAM 1 MG TABLET: 1 1 MG | ORAL | Qty: 1

## 2018-07-25 MED FILL — SEROQUEL 100 MG TABLET: 100 100 mg | ORAL | Qty: 1

## 2018-07-25 MED FILL — OXCARBAZEPINE 300 MG TABLET: 300 300 MG | ORAL | Qty: 1

## 2018-07-25 MED FILL — RISPERIDONE 2 MG DISINTEGRATING TABLET: 2 2 MG | ORAL | Qty: 1

## 2018-07-25 MED FILL — AMLODIPINE 10 MG TABLET: 10 10 MG | ORAL | Qty: 1

## 2018-07-25 MED FILL — METFORMIN ER 500 MG TABLET,EXTENDED RELEASE 24 HR: 500 500 MG | ORAL | Qty: 1

## 2018-07-25 MED FILL — QUETIAPINE 300 MG TABLET: 300 300 MG | ORAL | Qty: 1

## 2018-07-25 MED FILL — SIMETHICONE 80 MG CHEWABLE TABLET: 80 80 MG | ORAL | Qty: 1

## 2018-07-25 MED FILL — TYLENOL 325 MG TABLET: 325 325 mg | ORAL | Qty: 2

## 2018-07-25 MED FILL — MELATONIN 3 MG TABLET: 3 3 mg | ORAL | Qty: 2

## 2018-07-25 MED FILL — HYDRALAZINE 10 MG TABLET: 10 10 MG | ORAL | Qty: 1

## 2018-07-25 NOTE — Unmapped (Signed)
Problem: Major Mood Disorder  Goal: DC Criteria-Mania/depression won't neg affect daily function  Intervention: Other psychotherapeutic interventions (describe)  Description: Other psychotherapeutic interventions (describe)  Note: Patient cooperative with treatment, still manic. Patient going to groups. Patient does have some insight into his illness, although displays poor judgement due to illness. MD completed Affidavit of Mental Illness as patient is in need of continued inpatient treatment. Affidavit filed with [facility] today. Hearing will be on 07/27/18.

## 2018-07-25 NOTE — Unmapped (Signed)
Night Shift Nursing Note ([id number])    Nathaniel Maxwell, LOS: 5  <principal problem not specified>    Vital signs in last 24 hours:   Vitals:    07/24/18 0903   BP: (!) 179/105   Pulse: 95   Resp: 18   Temp: 98.1 ??F (36.7 ??C)   SpO2: 99%       Overnight Assessment:   Pt rested in bed, respirations unlabored and even.  Pt out on unit complaining of flatulence, PRN Simethicone  40 mg given, effective. Pt in and out of his room, limit set.   No unsafe behaviors observed to this time. Slept for 5 hrs.   Safety being maintained per unit protocol.   Will continue to monitor for safety and needs this shift.    Marcell Anger, RN  07/25/2018

## 2018-07-25 NOTE — Unmapped (Signed)
Occupational Therapy/Therapeutic Recreation Progress Note      Jobani Sabado  [id number]      Group: Task  Attendance: Attended  45/45 minutes  Level of Participation: Active, Interacted with peer(s), Interacted with staff, Needed Cues to Communicate     Pt readily attended group upon invitation. Pt had over-excitable affect during group. Pt started on a the computer searching patient communication number and contact info to record on paper and another religious site. Pt then switched to coloring and listening to music during group. Pt's task completion was 25% organized. Pt demonstrated 25-50% focus on task. Pt easily distracted by other peers, and intrusive into their personal space. Pt did seem to be good intentioned to comfort another peer who was crying, however was intrusive after peer said he wanted to be left alone. Pt able to be redirected with mod VC.  Pt had semi-appropriate interaction with staff and peers. Pt continues to exhibit manic behaviors during group but not as irritable/aggressive.       MARIA GRACE SALOMONE, OT  07/25/2018 11:45 AM

## 2018-07-25 NOTE — Unmapped (Signed)
Problem: Major Thought Disorder  Goal: DC Criteria-Thought D/O won't neg. affect daily functioning  Outcome: Progressing  Patient visible on the unit, interacting and playing cards with peers. Attended group. Denies SI/HI/AV Hallucination. Speech is pressured and affect is elevated. Patient making multiple requests on different things throughout the shift and asking if there is any other medications he can get but unable to elaborate when questioned. PRN Ativan 1mg  PO for c/o anxiety and restlessness with effective results. PRN Tums given for c/o indigestion and Tylenol for c/o headache.Compliant with scheduled medications. No unsafe behaviors noted. Safety maintained per unit protocol. Will continue 15 minutes checks.     Vitals:    07/24/18 0903 07/25/18 0700   BP: (!) 179/105 155/81   Pulse: 95 107   Resp: 18 18   Temp: 98.1 ??F (36.7 ??C) 98.5 ??F (36.9 ??C)   SpO2: 99% 99%       Scheduled Medications:  ??? amLODIPine  10 mg Oral Daily 0900   ??? loratadine  10 mg Oral Daily 0900   ??? melatonin  6 mg Oral Nightly (2100)   ??? metFORMIN  500 mg Oral Daily with breakfast   ??? neomycin-bacitracin-polymyxin   Topical Daily 0900   ??? OXcarbazepine  300 mg Oral Nightly (2100)   ??? quetiapine  300 mg Oral Daily 0900   ??? QUEtiapine  500 mg Oral Nightly (2100)   ??? terazosin  10 mg Oral Nightly (2100)
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
## 2018-07-25 NOTE — Unmapped (Signed)
Occupational Therapy/Therapeutic Recreation Progress Note      Nathaniel Maxwell  [id number]      Group: Life Skills  Attendance: Attended  45/45 minutes  Level of Participation: Active, Interacted with peer(s), Interacted with staff, Needed Cues to Communicate and Followed Directions    Pt readily attended group upon invitation. Pt participated in life skills group focusing on teamwork, communication, and gross motor activities. Pt had a brightened affect during group. Pt was able to attend to 50% of the group with 50% organization. Pt benefits from Vc for redirecting his manic behaviors and tangential speech. Pt was a team player and did have positive comments to support both peers on his team as well as those not on his team. Pt had appropriate interaction with staff and peers. Pt tried to encourage other peers to stay in the group room or join as well. Pt able to ID ring toss as a fun leisure activity he wants to try with his nephews at home. Pt also able to ID challenges of communication while playing the group parachute ball game. Pt required mod prompting from staff to refocus and attend to the task of the group activity. Pt was able to provide insight into his mental illness, race, and criminal record as the balls on his own parachute he has to overcome and communicate that he may need extra help with. Pt encouraged therapy to do more group activities like this one today.       MARIA GRACE SALOMONE, OT  07/25/2018 2:17 PM

## 2018-07-25 NOTE — Unmapped (Signed)
1500-1930  Pt has been out on the unit. Social with peers and staff. He is making and receiving phone calls. Up to the nurses station frequently with many various request. He c/o gas, received Simethicone with fair results. Will continue to monitor per unit protocol.

## 2018-07-25 NOTE — Unmapped (Addendum)
[id number]    Upon engagement patient observed to be anxious, demanding, and irritable.  RN was given several lists of things this patient repeatedly expressed that he needed immediately.  Reassurance was provided that staff would do what they could and any messages would be passed on to the treatment team.      Patient states he needs to see his doctor Dr. Candie Mile at [address] today.  Patient needs melatonin 6 mg increased to 10 mg.   Patient requests to be awakened at 0600 each morning for prayer.      Patient found to be staff splitting this shift.     Patient requested two clean sheets 3 times this shift and was given them.  Patient stated that all of his sheets are stained.     Denies suicidal/homicidal ideation, and auditory/visual hallucinations.     Attended and participated in Task and Life Skills groups today per OT notes.     No unsafe behaviors observed.  Patient called staff to his room this evening and upon entering his room he was sitting in his bed in only his underwear.  Patient covered himself when asked to do so.      Independent with activities of daily living, supplies provided.      Patient consumed 2000 ml and 100% of beverages, meals and snacks today.      Compliant with all scheduled medication.     PRN medication administered this shift; 2147 - hydralazine 10 mg given due to SBP>160/DBP>100, will reassess and continue to monitor.  2147 - Ativan 1 mg given for s/s of anxiety and agitation with no reported or observed effect.     ABS 6 at 2045 - patient offered PRN Risperidone 2 mg for agitation.  Patient refused stating that he will never take Risperdal because it is an antipsychotic and he does not need it.      Will continue to monitor.  Safety maintained per unit protocol.     Patient Vitals for the past 24 hrs:   BP Temp Temp src Pulse Resp SpO2   07/25/18 2047 (!) 183/109 -- -- 94 18 98 %   07/25/18 0700 155/81 98.5 ??F (36.9 ??C) Oral 107 18 99 %       Scheduled  Medications:  ??? amLODIPine  10 mg Oral Daily 0900   ??? loratadine  10 mg Oral Daily 0900   ??? melatonin  6 mg Oral Nightly (2100)   ??? metFORMIN  500 mg Oral Daily with breakfast   ??? neomycin-bacitracin-polymyxin   Topical Daily 0900   ??? OXcarbazepine  300 mg Oral Nightly (2100)   ??? quetiapine  300 mg Oral Daily 0900   ??? QUEtiapine  500 mg Oral Nightly (2100)   ??? terazosin  10 mg Oral Nightly (2100)

## 2018-07-25 NOTE — Unmapped (Signed)
Co-Occurring CD Group          Nathaniel Maxwell  [id number]      GROUP TOPIC:  CBA    Attendance: attended class  Time 45  Level of participation: Active, Interacted with peer(s), Remained on task and Followed Directions    Pt did not identify a drug of choice during group. Pt used CBA to identify the positive and negatives about his bi-polar disorder.

## 2018-07-26 MED ORDER — hydroCHLOROthiazide (HYDRODIURIL) tablet 25 mg
25 | Freq: Every day | ORAL | Status: AC
Start: 2018-07-26 — End: 2018-08-07
  Administered 2018-07-26 – 2018-08-07 (×13): 25 mg via ORAL

## 2018-07-26 MED ORDER — polyethylene glycol (MIRALAX) packet 17 g
17 | Freq: Every day | ORAL | Status: AC
Start: 2018-07-26 — End: 2018-08-07
  Administered 2018-07-26 – 2018-08-07 (×13): 17 g via ORAL

## 2018-07-26 MED ORDER — melatonin Tab 15 mg
3 | Freq: Every evening | ORAL | Status: AC
Start: 2018-07-26 — End: 2018-08-07
  Administered 2018-07-27 – 2018-08-07 (×12): 15 mg via ORAL

## 2018-07-26 MED ORDER — LORazepam (ATIVAN) tablet 1 mg
1 | Freq: Once | ORAL | Status: AC
Start: 2018-07-26 — End: 2018-07-26
  Administered 2018-07-26: 04:00:00 1 mg via ORAL

## 2018-07-26 MED ORDER — traZODone (DESYREL) tablet 100 mg
100 | Freq: Every evening | ORAL | Status: AC | PRN
Start: 2018-07-26 — End: 2018-08-07
  Administered 2018-07-27: 04:00:00 100 mg via ORAL

## 2018-07-26 MED FILL — CALCIUM ANTACID 200 MG CALCIUM (500 MG) CHEWABLE TABLET: 200 200 mg calcium (500 mg) | ORAL | Qty: 1

## 2018-07-26 MED FILL — QUETIAPINE 300 MG TABLET: 300 300 MG | ORAL | Qty: 1

## 2018-07-26 MED FILL — TRAZODONE 100 MG TABLET: 100 100 MG | ORAL | Qty: 1

## 2018-07-26 MED FILL — METFORMIN ER 500 MG TABLET,EXTENDED RELEASE 24 HR: 500 500 MG | ORAL | Qty: 1

## 2018-07-26 MED FILL — MAGNESIUM HYDROXIDE 2,400 MG/10 ML ORAL SUSPENSION: 2400 2,400 mg/10 mL | ORAL | Qty: 10

## 2018-07-26 MED FILL — POLYETHYLENE GLYCOL 3350 17 GRAM ORAL POWDER PACKET: 17 17 gram | ORAL | Qty: 1

## 2018-07-26 MED FILL — LORAZEPAM 1 MG TABLET: 1 1 MG | ORAL | Qty: 1

## 2018-07-26 MED FILL — TYLENOL 325 MG TABLET: 325 325 mg | ORAL | Qty: 2

## 2018-07-26 MED FILL — OXCARBAZEPINE 300 MG TABLET: 300 300 MG | ORAL | Qty: 1

## 2018-07-26 MED FILL — SIMETHICONE 80 MG CHEWABLE TABLET: 80 80 MG | ORAL | Qty: 1

## 2018-07-26 MED FILL — HYDRALAZINE 10 MG TABLET: 10 10 MG | ORAL | Qty: 1

## 2018-07-26 MED FILL — SEROQUEL 100 MG TABLET: 100 100 mg | ORAL | Qty: 1

## 2018-07-26 MED FILL — AMLODIPINE 10 MG TABLET: 10 10 MG | ORAL | Qty: 1

## 2018-07-26 MED FILL — TERAZOSIN 5 MG CAPSULE: 5 5 MG | ORAL | Qty: 2

## 2018-07-26 MED FILL — HYDROCHLOROTHIAZIDE 25 MG TABLET: 25 25 MG | ORAL | Qty: 1

## 2018-07-26 MED FILL — MELATONIN 3 MG TABLET: 3 3 mg | ORAL | Qty: 5

## 2018-07-26 MED FILL — LORATADINE 10 MG TABLET: 10 10 mg | ORAL | Qty: 1

## 2018-07-26 NOTE — Unmapped (Addendum)
Called but no answer, unclear if vm is residents. Sent secure message
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
## 2018-07-26 NOTE — Unmapped (Signed)
Problem: Major Thought Disorder  Goal: DC Criteria-Thought D/O won't neg. affect daily functioning  Intervention: Assess for target symptoms & provide for safety  Description: Assess for target symptoms and provide for safety  Note: Social on unit. Somatic. Complained of constipation, headache, feet hurting and indigestion. Miralax scheduled for constipation. Received tylenol 650 mg po prn for headache rated 5/10 and feet hurting at 0846 with effective results at 0946. Received Tums 1 tablet/500 mg at 0917 for complaints of indigestion with effective results at 1017. Intrusive. Compliant with medication. Support offered. Safety maintained.

## 2018-07-26 NOTE — Unmapped (Signed)
Occupational Therapy/Therapeutic Recreation Progress Note      Nathaniel Maxwell  [id number]      Group: Life Skills  Attendance: Attended  45/45 minutes  Level of Participation: Active, Interacted with peer(s), Interacted with staff and Initiated Comment(s)    Pt readily attended group upon invitation. Pt much more intrusive compared to this morning. Pt was loud, hyper verbal. Pt giving unsolicted advice to peers about hospitalization. Pt was able to create an all about me collage. Pt reported he likes to spend time with his grandchildren and he likes beautiful things (picture of a celebrity in a bathing suit was pasted on his collage). Pt intrusive when others were sharing, poor social boundaries.     Law Corsino, OTR/L  07/26/2018 2:35 PM

## 2018-07-26 NOTE — Unmapped (Signed)
Occupational Therapy/Therapeutic Recreation Progress Note      Nathaniel Maxwell  [id number]      Group: Task  Attendance: Attended  45/45 minutes  Level of Participation: Active, Interacted with peer(s), Interacted with staff, Initiated Comment(s) and Followed Directions    Pt readily attended group upon invitation. Pt was much calmer this date, less intrusive with peers and staff, and demonstrated increased focus. Pt first attempted to listen to music but demonstrated difficulty navigating the web. Pt then selected to work on his coloring project and listen to the radio. Pt appeared to have some word finding difficulties when expressing to staff his preferred activity. No management issues noted. Pt was pleasant and cooperative throughout.       Nathaniel Maxwell, OTR/L  07/26/2018 12:30 PM

## 2018-07-26 NOTE — Unmapped (Addendum)
[id number]    Patient awake throughout the night and out on unit making various requests.   No scheduled or PRN medications this shift.  (Patient refuses PRN Risperdal for increased ABS)  Pt received PRN Melatonin 6 mg and Ativan 1 mg previous shift.  Given 1 X dose Ativan 1 mg for atiation this shift with good effect.  Patient slept for approximately 2 hours.    No unsafe or inappropriate behaviors observed.   Patient requests his doctor Dr. Candie Mile at [address], phone [phone] be contacted so she can address his blood pressure issues.    Safety maintained per unit protocol.     BP 160/104 @ 0642  Hydralazine 10 mg only somewhat effective.

## 2018-07-26 NOTE — Unmapped (Signed)
Problem: Major Thought Disorder  Goal: DC Criteria-Thought D/O won't neg. affect daily functioning  Outcome: Progressing  Note: Care plan reviewed and goals updated as appropriate. Pt attended 100% groups since admission. Pt very active within group programming. Pt with variable ability to demonstrate appropriate social skills, have decreased pressured speech and be less intrusive with peers and staff. Will continue to encourage positive and appropriate interactions with staff and peers. Continue per POC to progress STGs.

## 2018-07-26 NOTE — Unmapped (Signed)
Problem: Major Mood Disorder  Goal: DC Criteria-Mania/depression won't neg affect daily function  Intervention: Other psychotherapeutic interventions (describe)  Description: Other psychotherapeutic interventions (describe)  Note: Met with patient several times on the unit throughout the day. Patient hyperverbal, manic. Patient at times forgot what he even wanted to talk with SW about. 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 Patient states he intends to show the court the he hasn't neglected his outpatient treatment and has been keeping up with his psychiatrist and therapist at [facility] as well as his medical appointments with Dr. Durwin Nora, his PCP.

## 2018-07-26 NOTE — Unmapped (Signed)
[id number] Remains active on the unit. Intrusive at times. Refused scheduled Hydrochloride 25 mg po prn at 1230 stated  I told the doctor they need to call my doctor E. Dixon she always get my blood pressure straightened out I don't have these problems. Blood pressure at 1500 166/119 patient received PRN medication hydralazine 10 mg po prn at 1503.  Blood pressure at 1600 166/117.  Encouraged patient to take scheduled blood pressure medication. Patient agreed. Called psych OD Dr. Venancio Poisson order given to give patient scheduled BP medication he did not take at 1230. At 1612 received hydrochlorothiazide 25 mg po. Continues to complain of Indigestion received Tums 500 mg/tablet and milk of magnesia 10 ml for complaints of constipation at 1638 both with results pending. Patient encouraged to report improvement in symptoms and bowel movement to writer. Support offered. Safety maintained.

## 2018-07-26 NOTE — Unmapped (Signed)
Calling to speak with provider regarding pt who is currently admitted to psych ward. Dr Allena Katz has concerns regarding pt blood pressure. Wants to discuss plan of care. Please call .

## 2018-07-26 NOTE — Unmapped (Addendum)
[id number]  Pt visible in unit, highly verbal, intrusive, medication seeking, able to make needs known. Denies SI/HI/AVH. Focus on court tomorrow, if I could just write something and send. Explained to pt about court as he is getting anxious and restless. No unsafe behavior. ABS = 0. Safety in place.

## 2018-07-27 MED FILL — AMLODIPINE 10 MG TABLET: 10 10 MG | ORAL | Qty: 1

## 2018-07-27 MED FILL — TERAZOSIN 5 MG CAPSULE: 5 5 MG | ORAL | Qty: 2

## 2018-07-27 MED FILL — LORATADINE 10 MG TABLET: 10 10 mg | ORAL | Qty: 1

## 2018-07-27 MED FILL — QUETIAPINE 300 MG TABLET: 300 300 MG | ORAL | Qty: 1

## 2018-07-27 MED FILL — HYDROCHLOROTHIAZIDE 25 MG TABLET: 25 25 MG | ORAL | Qty: 1

## 2018-07-27 MED FILL — SEROQUEL 100 MG TABLET: 100 100 mg | ORAL | Qty: 1

## 2018-07-27 MED FILL — METFORMIN ER 500 MG TABLET,EXTENDED RELEASE 24 HR: 500 500 MG | ORAL | Qty: 1

## 2018-07-27 MED FILL — MELATONIN 3 MG TABLET: 3 3 mg | ORAL | Qty: 5

## 2018-07-27 MED FILL — PEPTO-BISMOL 262 MG CHEWABLE TABLET: 262 262 mg | ORAL | Qty: 2

## 2018-07-27 MED FILL — CALCIUM ANTACID 200 MG CALCIUM (500 MG) CHEWABLE TABLET: 200 200 mg calcium (500 mg) | ORAL | Qty: 1

## 2018-07-27 MED FILL — TYLENOL 325 MG TABLET: 325 325 mg | ORAL | Qty: 2

## 2018-07-27 MED FILL — POLYETHYLENE GLYCOL 3350 17 GRAM ORAL POWDER PACKET: 17 17 gram | ORAL | Qty: 1

## 2018-07-27 NOTE — Unmapped (Signed)
Occupational Therapy/Therapeutic Recreation Progress Note      Nathaniel Maxwell  [id number]      Group: Stress Management  Attendance: Attended  35/45 minutes  Level of Participation: Active    Pt readily attended group upon invitation. Pt completed hand washing hygiene task. Pt had appropriate affect and ADLs. Pt engaged in a guided meditation, progressive muscle relaxation and affirmations group task this date. Pt appeared restless during guided meditation task. Pt was hyper verbal and tangential at times during the affirmations task. Pt stated he enjoyed the affirmations and sensory (diffuser) portions of group. Pt left group briefly to take a phone call and to speak with the doctor. Pt returned afterwards. Pt interacted with others. Pt did not demonstrate any unsafe behaviors during this group. Pt stated to staff after group his divorce is a trigger for him. Pt demonstrated pleasant behaviors towards staff and peers throughout group this date.    Rhae Hammock, COTA/L  07/27/2018 2:54 PM

## 2018-07-27 NOTE — Unmapped (Signed)
Occupational Therapy/Therapeutic Recreation Progress Note      Nathaniel Maxwell  [id number]      Group: Task  Attendance: Excused  0 minutes  Level of Participation: None    Pt was in court this morning.      Thea Gist, CTRS  07/27/2018 12:00 PM

## 2018-07-27 NOTE — Unmapped (Addendum)
[id number]:     Patient found dressed in the dining area, eating breakfast. Appearance improved. Pt sporting shorts and button-down shirt for probate court.  Pt attended probate court this morning.   Medication compliant.   Pt requested and provided an ice-pack for his headache. Later, pt c/o continuing to take a headache. PRN Tylenol 650 mg administered at 1324 for headache rated 4/10, with good results.    Pt denies any AVH/SI/HI  Pt remains hypertensive.BP=147/96 at 0723, HR=100 and 145/100 at 1210, HR=103 and 163/96 at 1600.    Patient ate 100% of his breakfast, lunch and dinner.  Patient fluid input= 2760 mL.  Pt provided with urinal to measure output late in the shift. Pt instructed to urinate for output measurement.  Pt weighed at 1830 and found to be 229 lbs.  Patient is calm, cooperative, polite and complimentary with this Clinical research associate.  Patient visible the majority of the day. He attended group, used the telephone several times, watched television in the dining area and is social with peers and select staff.    Patient shared with this writer that he does not like taking the hydrodiuril because it makes him pee all the time. He reports that he might stop taking it because of this. Writer explained that getting excess water is what the medication is supposed to do and in turn lower his blood pressure.    Patient also shared with this patient that he does not want Dr. Valentina Lucks to see him over the weekend and he does not Dr. Wesley Blas residents rounding with him when he comes to talk to the patient.     Patient told this RN that he would like to be transferred to [facility] because he likes it there better, less dirty than this facility and they have better food. Writer informed the patient that he was probated to this facility.
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
## 2018-07-27 NOTE — Unmapped (Signed)
[phone]    Patient out on unit to meet needs.  Socially inteactive with staff.    Friendly and cooperative with Clinical research associate.  Denies suicidal/homicidal ideation, and auditory/visual hallucinations.   No unsafe/inappropriate behaviors observed.  Independent with activities of daily living, and practicing improved hygiene routine.  Compliant with all scheduled medication.  PRN medication administered this shift; 2001 - Tums, effective.  Safety maintained per unit protocol.     Patient Vitals for the past 24 hrs:   BP Temp Temp src Pulse Resp SpO2 Weight   07/27/18 1900 -- -- -- -- -- -- (!) 229 lb (103.9 kg)   07/27/18 1600 (!) 163/96 98.6 ??F (37 ??C) Oral 107 18 99 % --   07/27/18 1214 (!) 145/100 -- -- 103 -- -- --   07/27/18 0723 (!) 147/96 98.7 ??F (37.1 ??C) Oral 90 18 100 % --       Scheduled medicatons  ??? amLODIPine  10 mg Oral Daily 0900   ??? hydroCHLOROthiazide  25 mg Oral Daily 0900   ??? loratadine  10 mg Oral Daily 0900   ??? melatonin  15 mg Oral Nightly (2100)   ??? metFORMIN  500 mg Oral Daily with breakfast   ??? neomycin-bacitracin-polymyxin   Topical Daily 0900   ??? polyethylene glycol  17 g Oral Daily 0900   ??? quetiapine  300 mg Oral Daily 0900   ??? QUEtiapine  500 mg Oral Nightly (2100)   ??? terazosin  10 mg Oral Nightly (2100)       Intake/Output Summary (Last 24 hours) at 07/27/2018 2327  Last data filed at 07/27/2018 2300  Gross per 24 hour   Intake 6020 ml   Output 725 ml   Net 5295 ml

## 2018-07-27 NOTE — Unmapped (Signed)
[id number]  Pt walked to nurse`s station requesting for sleep med and complained of anxiety c/o court tomorrow. ABS = 4. PRN Trazodone 100 mg and Ativan 1 mg given at 2330. Somewhat effective at 0030. Awaken x 1 at 0400. Medication seeking. Slept 2.15 hours.

## 2018-07-27 NOTE — Unmapped (Signed)
Problem: Major Mood Disorder  Goal: DC Criteria-Mania/depression won't neg affect daily function  Intervention: Other psychotherapeutic interventions (describe)  Description: Other psychotherapeutic interventions (describe)  Note: Pt was probated to [facility] at [facility] 5th floor today and the Notice Determining Mental Illness (SW#[id number]) was placed in pt's chart.

## 2018-07-28 MED ORDER — QUEtiapine (SEROQUEL) tablet 200 mg
200 | Freq: Every day | ORAL | Status: AC
Start: 2018-07-28 — End: 2018-07-29
  Administered 2018-07-29: 12:00:00 200 mg via ORAL

## 2018-07-28 MED ORDER — QUEtiapine (SEROQUEL) tablet 600 mg
300 | Freq: Every evening | ORAL | Status: AC
Start: 2018-07-28 — End: 2018-07-29
  Administered 2018-07-29: 01:00:00 600 mg via ORAL

## 2018-07-28 MED FILL — LORAZEPAM 1 MG TABLET: 1 1 MG | ORAL | Qty: 1

## 2018-07-28 MED FILL — MAGNESIUM HYDROXIDE 2,400 MG/10 ML ORAL SUSPENSION: 2400 2,400 mg/10 mL | ORAL | Qty: 10

## 2018-07-28 MED FILL — MELATONIN 3 MG TABLET: 3 3 mg | ORAL | Qty: 5

## 2018-07-28 MED FILL — AMLODIPINE 10 MG TABLET: 10 10 MG | ORAL | Qty: 1

## 2018-07-28 MED FILL — QUETIAPINE 300 MG TABLET: 300 300 MG | ORAL | Qty: 2

## 2018-07-28 MED FILL — TRIPLE ANTIBIOTIC 3.5 MG-400 UNIT-5,000 UNIT/GRAM TOPICAL OINTMENT: TOPICAL | Qty: 14.2

## 2018-07-28 MED FILL — METFORMIN ER 500 MG TABLET,EXTENDED RELEASE 24 HR: 500 500 MG | ORAL | Qty: 1

## 2018-07-28 MED FILL — LORATADINE 10 MG TABLET: 10 10 mg | ORAL | Qty: 1

## 2018-07-28 MED FILL — TERAZOSIN 5 MG CAPSULE: 5 5 MG | ORAL | Qty: 2

## 2018-07-28 MED FILL — POLYETHYLENE GLYCOL 3350 17 GRAM ORAL POWDER PACKET: 17 17 gram | ORAL | Qty: 1

## 2018-07-28 MED FILL — TRIPLE ANTIBIOTIC 3.5 MG-400 UNIT-5,000 UNIT/GRAM TOPICAL OINTMENT: TOPICAL | Qty: 28.4

## 2018-07-28 MED FILL — HYDROCHLOROTHIAZIDE 25 MG TABLET: 25 25 MG | ORAL | Qty: 1

## 2018-07-28 MED FILL — QUETIAPINE 300 MG TABLET: 300 300 MG | ORAL | Qty: 1

## 2018-07-28 MED FILL — HYDRALAZINE 10 MG TABLET: 10 10 MG | ORAL | Qty: 1

## 2018-07-28 NOTE — Unmapped (Signed)
Pt highly visible on the unit, multiple requests and somatic complaints. Pt slightly restless with pressured speech, conversational content remains somewhat loose and disorganized, pt continues to improve. ADL's improved, good intake, no safety or behavioral issues noted

## 2018-07-28 NOTE — Unmapped (Signed)
No meds scheduled. Pt. visualized on unit, currently on the phone. Safety provided to pt. Stated he was ok. Denied SI/HI, AVH. A&Ox3. Reassurance and orientation provided to pt. Was calm, cooperative, social when engaged. Hygiene encouraged, products left at bedside. Medically stable.

## 2018-07-28 NOTE — Unmapped (Signed)
[id number]  Pt napping at start of shift, visible during snack time and back to bed, preoccupied, less intrusive, less verbal, less medication seeking, restless, cooperative and noted to be murmuring something to self. Denies SI/HI/AVH. ABS = 0. No unsafe behavior. Safety checks in place.

## 2018-07-28 NOTE — Unmapped (Addendum)
[id number]    Patient rested in bed with even and unlabored respirations.   No scheduled or PRN medications this shift.    Patient slept for approximately 4 hours.    No unsafe or inappropriate behaviors observed.   Safety maintained per unit protocol.       Intake/Output Summary (Last 24 hours) at 07/28/2018 0733  Last data filed at 07/28/2018 0622  Gross per 24 hour   Intake 6820 ml   Output 2715 ml   Net 4105 ml       Patient Vitals for the past 24 hrs:   BP Temp Temp src Pulse Resp SpO2 Weight   07/28/18 0659 (!) 154/96 98.4 ??F (36.9 ??C) Oral 90 16 99 % --   07/27/18 1900 -- -- -- -- -- -- (!) 229 lb (103.9 kg)   07/27/18 1600 (!) 163/96 98.6 ??F (37 ??C) Oral 107 18 99 % --   07/27/18 1214 (!) 145/100 -- -- 103 -- -- --

## 2018-07-28 NOTE — Unmapped (Signed)
Occupational Therapy/Therapeutic Recreation Progress Note      Nathaniel Maxwell  [id number]      Group: Task  Attendance: Attended  45/45 minutes  Level of Participation: Active    Pt readily attended group upon invitation. Pt completed hand washing hygiene task. Pt had appropriate affect and ADLs. Pt initially engaged in a coloring task during group. Pt's task was organized. Pt also engaged in utilizing the computer to listen to music and look up different appropriate things on the internet. Pt informed staff he was looking up different apartments. Pt had limited interactions with others secondary to Pt was focused on tasks. Pt appeared less manic this date than previous day in group. Pt demonstrated pleasant and appropriate behaviors towards staff and peers throughout group this date.    Rhae Hammock, COTA/L  07/28/2018 1:31 PM

## 2018-07-28 NOTE — Unmapped (Signed)
Taking meds, clam, no unsafe behaviors, no srr, going to groups, needy
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
## 2018-07-29 MED ORDER — QUEtiapine (SEROQUEL) tablet 800 mg
400 | Freq: Every evening | ORAL | Status: AC
Start: 2018-07-29 — End: 2018-07-30
  Administered 2018-07-30: 800 mg via ORAL

## 2018-07-29 MED FILL — AMLODIPINE 10 MG TABLET: 10 10 MG | ORAL | Qty: 1

## 2018-07-29 MED FILL — POLYETHYLENE GLYCOL 3350 17 GRAM ORAL POWDER PACKET: 17 17 gram | ORAL | Qty: 1

## 2018-07-29 MED FILL — HYDROCHLOROTHIAZIDE 25 MG TABLET: 25 25 MG | ORAL | Qty: 1

## 2018-07-29 MED FILL — SEROQUEL 400 MG TABLET: 400 400 mg | ORAL | Qty: 2

## 2018-07-29 MED FILL — SEROQUEL 200 MG TABLET: 200 200 mg | ORAL | Qty: 1

## 2018-07-29 MED FILL — LORATADINE 10 MG TABLET: 10 10 mg | ORAL | Qty: 1

## 2018-07-29 MED FILL — TERAZOSIN 5 MG CAPSULE: 5 5 MG | ORAL | Qty: 2

## 2018-07-29 MED FILL — MELATONIN 3 MG TABLET: 3 3 mg | ORAL | Qty: 5

## 2018-07-29 MED FILL — METFORMIN ER 500 MG TABLET,EXTENDED RELEASE 24 HR: 500 500 MG | ORAL | Qty: 1
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
## 2018-07-29 NOTE — Unmapped (Addendum)
[id number]  Pt sleeping at start of shift. Respiration even and unlabored. No s/s of discomfort or distress noted. Drank a pitcher of water about 800 ml. Awaken this AM, intrusive, demanding and preoccupied. Slept all night.

## 2018-07-29 NOTE — Unmapped (Signed)
Pt. was med compliant. Pt. visualized on unit, has had multiple and frequent demands and needs. Safety provided to pt. Stated he was ok. Denied SI/HI, AVH. A&Ox3. Reassurance and orientation provided to pt. Was demanding, tangential, gamey at times with staff, but overall cooperative. Hygiene encouraged, products left at bedside. Medically stable. Will continue to monitor.

## 2018-07-29 NOTE — Unmapped (Signed)
Occupational Therapy/Therapeutic Recreation Progress Note      Keo Schirmer  [id number]      Group: Task  Attendance: Attended  60/60 minutes  Level of Participation: Active, Interacted with peer(s), Interacted with staff and Followed Directions    Pt readily attended group upon invitation. Pt had more appropriate affect during group. Pt noted to have decreased manic behaviors in group as pt only left his chair 1 time and only switch activities 1x during group. Pt initially asked to start a new task but then decided it better to finish the ones he has already started. Pt was able to remain seated for aprox 75% of group which is an improvement from the last few days. Pt less pressured in his speech and decreased labile content of speech. Pt able to have short and calm conservations with peers and staff. Pt listened to music while coloring a Designer, television/film set. Pt also got on the computer IND and was searching things for when he is d/c such as housing. Pt's task completion was more organized. Pt demonstrated 75% focus on task. Pt had more appropriate interaction with staff and peers. Pt is progressing toward goals.      MARIA GRACE SALOMONE, OT  07/29/2018 11:54 AM

## 2018-07-29 NOTE — Unmapped (Signed)
Has been out on unit, watching TV, on phone at times. Has not been a behavioral problem this shift. Is recording his I&O gave this nurse the amounts to record. Medication compliant. Denies SI/HI and AVH. 15 minute safety checks maintained per unit protocol.

## 2018-07-29 NOTE — Unmapped (Signed)
Pt remains demanding and intrusive to staff. Has been on the phone and out on unit most of the day. Has multiple request and at times has been irritable when needs are not met right away due to staff working with other patients. Denies all psych symptoms. Will continue to monitor.

## 2018-07-30 MED ORDER — QUEtiapine (SEROQUEL) tablet 900 mg
300 | Freq: Every evening | ORAL | Status: AC
Start: 2018-07-30 — End: 2018-07-31
  Administered 2018-07-31: 900 mg via ORAL

## 2018-07-30 MED FILL — HYDROCHLOROTHIAZIDE 25 MG TABLET: 25 25 MG | ORAL | Qty: 1

## 2018-07-30 MED FILL — AMLODIPINE 10 MG TABLET: 10 10 MG | ORAL | Qty: 1

## 2018-07-30 MED FILL — HYDRALAZINE 10 MG TABLET: 10 10 MG | ORAL | Qty: 1

## 2018-07-30 MED FILL — LORATADINE 10 MG TABLET: 10 10 mg | ORAL | Qty: 1

## 2018-07-30 MED FILL — MELATONIN 3 MG TABLET: 3 3 mg | ORAL | Qty: 5

## 2018-07-30 MED FILL — QUETIAPINE 300 MG TABLET: 300 300 MG | ORAL | Qty: 3

## 2018-07-30 MED FILL — POLYETHYLENE GLYCOL 3350 17 GRAM ORAL POWDER PACKET: 17 17 gram | ORAL | Qty: 1

## 2018-07-30 MED FILL — METFORMIN ER 500 MG TABLET,EXTENDED RELEASE 24 HR: 500 500 MG | ORAL | Qty: 1

## 2018-07-30 MED FILL — TERAZOSIN 5 MG CAPSULE: 5 5 MG | ORAL | Qty: 2

## 2018-07-30 NOTE — Unmapped (Signed)
Occupational Therapy/Therapeutic Recreation Progress Note      Nathaniel Maxwell  [id number]      Group: Task  Attendance: Attended  50/50 minutes  Level of Participation: Active, Interacted with peer(s), Interacted with staff, Needed Cues to Communicate and Followed Directions    Pt readily attended group upon invitation. Pt had a more appropriate affect during group. Pt started on 2 new task during group that were started by former patient's. Pt was cooperative and calm when his original selected task of origami was not available.  Pt's task completion was 50% organized. Pt demonstrated 50% focus on task. While pt is still flipping in between tasks he is able to sit still for entire group and is significantly less intrusive with peers compared to earlier last week. Pt had appropriate interaction with staff and peers. Pt is progressing toward goals as seen by increased ability to attend to tasks and increased positive interaction with less hyperverbal content.      MARIA GRACE SALOMONE, OT  07/30/2018 1:23 PM

## 2018-07-30 NOTE — Unmapped (Signed)
[id number]    Patient slept off and on. Respirations even and unlabored. No acute distress noted. At the beginning of shift patient came to nurses station to let this nurse know that it scared him when TS came into his room, but he was more concerned that he might have scared TS. Support and reassurance given. No behavioral issues observed. Patient tracked I/O and it was documented. Safety maintained per unit protocol.

## 2018-07-30 NOTE — Progress Notes (Signed)
E2: Med compliant. Polite using phone at intervals between peers using it. No behavioral issues. Will monitor per protocol.

## 2018-07-30 NOTE — Unmapped (Addendum)
Patients blood pressure remains elevated  States having a headache  Given hydralazine per prn order for blood pressure of 158/102 and ice pack for head per request  Skin warm and dry  Currently watching tv   Will monitor for effectiveness       1130-BP 170/90  Will continue to monitor

## 2018-07-30 NOTE — Unmapped (Signed)
Occupational Therapy/Therapeutic Recreation Progress Note      Nathaniel Maxwell  [id number]      Group: Wellness and Health Management  Attendance: Attended  35/35 minutes  Level of Participation: Active, Interacted with peer(s), Interacted with staff, Needed Cues to Communicate and Followed Directions    Pt readily attended group upon invitation. Pt participated in a wellness and health management group focusing on a jeopardy game using 5 categories related to health, mental illness, exercise/eating well, their hospital stay here, and cog riddles. Pt had a flat, but brighter affect during group. Pt was able to attend to 75% of the group with 75% organization. Pt made improvements as he was less intrusive and able to allow other peers on his team to answer questions and provide more time to generate a response instead of responding for them. Pt able to catch himself before going on long tangents and interrupting others. Pt had appropriate interaction with staff and peers. Pt required min prompting from staff to complete group activity. Pt able to ID one thing he learned from today's group is that you should drink 8 cups or 64 ounces of water a day.       MARIA GRACE SALOMONE, OT  07/30/2018 3:43 PM

## 2018-07-30 NOTE — Unmapped (Addendum)
Received call from Anna-Patient relations stating patient had called to address concerns regarding his admission and probate court  Patient had expressed to her his concerns with statements made regarding him being sexually inappropriate and how that would affect him when discharged and if could change documents   Informed would pass this on to treatment team and also discuss concerns with patient for clarification     Met with patient to allow time to address thoughts  Patient anxious and minimizing [place] I'm already asleep on that one   Reassurance given of HIPPA and privacy and events not being public  Patient voiced understanding continuing to talk in hyperverbal rapid pace and minimizing and dismissing concerns which brought him in and probate concerns as addressed with patient relations to help alleviate fears     Patient does voice appreciation with assistance  Updated on medications as requested and changes made
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
## 2018-07-30 NOTE — Unmapped (Signed)
Problem: Major Thought Disorder  Goal: DC Criteria-Thought D/O won't neg. affect daily functioning  07/30/2018 0947 by Rosalyn Gess, RN  Outcome: Progressing  07/30/2018 0911 by Rosalyn Gess, RN  Outcome: Progressing   Patient cooperative and polite in interactions  Denies any complaints but is somewhat hyperverbal and noted to have some anxiety  Came up to nurses station asking to see his visitor log and wanting to erase all family from being given information  I will pass on information to them  States only wanting to speak to his two sisters and again re-iterated not wanting information to be given   Denied and declined wanting to elaborate further on wanting to make these changes  Assisted with making changes on information sheet  Wanting to speak with a chaplain  Message left with chaplain to have visit  Compliant with medications and in updating staff on intake and output and doing so appropriately  Denies any thoughts to harm self or others  No agitation noted  Encouraged to continue to seek staff for assistance

## 2018-07-31 MED ORDER — QUEtiapine (SEROQUEL) tablet 1,000 mg
200 | Freq: Every evening | ORAL | Status: AC
Start: 2018-07-31 — End: 2018-08-06
  Administered 2018-08-01 – 2018-08-06 (×6): 1000 mg via ORAL

## 2018-07-31 MED FILL — MELATONIN 3 MG TABLET: 3 3 mg | ORAL | Qty: 5

## 2018-07-31 MED FILL — HYDROCHLOROTHIAZIDE 25 MG TABLET: 25 25 MG | ORAL | Qty: 1

## 2018-07-31 MED FILL — SEROQUEL 200 MG TABLET: 200 200 mg | ORAL | Qty: 1

## 2018-07-31 MED FILL — AMLODIPINE 10 MG TABLET: 10 10 MG | ORAL | Qty: 1

## 2018-07-31 MED FILL — TERAZOSIN 5 MG CAPSULE: 5 5 MG | ORAL | Qty: 2

## 2018-07-31 MED FILL — METFORMIN ER 500 MG TABLET,EXTENDED RELEASE 24 HR: 500 500 MG | ORAL | Qty: 1

## 2018-07-31 MED FILL — POLYETHYLENE GLYCOL 3350 17 GRAM ORAL POWDER PACKET: 17 17 gram | ORAL | Qty: 1

## 2018-07-31 MED FILL — TYLENOL 325 MG TABLET: 325 325 mg | ORAL | Qty: 2

## 2018-07-31 MED FILL — LORATADINE 10 MG TABLET: 10 10 mg | ORAL | Qty: 1

## 2018-07-31 NOTE — Unmapped (Signed)
Occupational Therapy/Therapeutic Recreation Progress Note      Nathaniel Maxwell  [id number]      Group: 1:1  Attendance: Attended  20 minutes  Level of Participation: Active    Met with Pt to go over different brain teaser worksheets that Pt was interested in doing from previous group. Pt demonstrated appropriate and thankful behaviors.    Rhae Hammock, COTA/L  07/31/2018 4:57 PM

## 2018-07-31 NOTE — Plan of Care (Signed)
Problem: Major Mood Disorder  Goal: DC Criteria-Mania/depression won't neg affect daily function  Intervention: Other psychotherapeutic interventions (describe)  Description: Other psychotherapeutic interventions (describe)  Note: Spoke with patient's [facility] therapist William Dalton [phone] ext. [phone]). Gave her an update on patient's current status. Annabelle Harman states that patient is calling her constantly - she feels patient remains very manic. Patient is having all of his providers contact one another for continuity. Annabelle Harman states that she has made a referral for case management for patient and he will be assigned a case manager prior to his discharge. Patient also remains on the [facility] waiting list. Annabelle Harman will also schedule patient for follow-up visits when he is ready for d/c.   Met with patient on the unit. Patient upset because the resident instead of the attending MD has been communicating with patient's PCP (Dr. Durwin Nora). Patient requesting that the attending speak with Dr. Durwin Nora. This will be passed on to the treatment team.
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
## 2018-07-31 NOTE — Progress Notes (Signed)
E2: Med compliant but pt voiced frustration that Treatment Team was not specific in letting him know exactly how much his Seroquel was increasing at HS med pass. Pt also stated he prefers Attending MD coverage over Resident MD coverage for changing his MAR. Pt voiced he was pleased with the efficacy of 15 mg melatonin at HS.  Pt requested and given an ice pack for headache. Pt c/o excessive stomach acid prior to snack time, encouraged to eat snacks well and no further complaints about it. Pleasant and social with this RN. Pt requested and given explanation on was a QT interval is that was written on one of his med info papers. Will monitor per protocol.

## 2018-07-31 NOTE — Unmapped (Signed)
Occupational Therapy/Therapeutic Recreation Progress Note      Nathaniel Maxwell  [id number]      Group: Life Skills  Attendance: Attended  45/45 minutes  Level of Participation: Active    Pt readily attended group upon invitation. Pt had a bright affect and appropriate ADLs. Pt engaged in a Name 5 and group juggle tasks during group this date. Pt was able to follow instructions to task and assist others when needed. Pt was smiling and appearing to enjoy group. Pt stated multiple times that he enjoys attending the therapy groups and feels that they are beneficial. Pt stated he was very thankful for his time here while in the hospital and stated he feels much better. Pt verbalized good insight to having pressured speech and being hyper verbal. Pt was very thoughtful during group with offering to get peer a cup of water when peer stated he felt parched. Pt interacted with others and was very pleasant. Pt did not demonstrate any unsafe behaviors during this group. Pt demonstrated appropriate and respectful behaviors towards staff and peers throughout group this date.    Rhae Hammock, COTA/L  07/31/2018 3:03 PM

## 2018-07-31 NOTE — Progress Notes (Signed)
[id number] Visible on the unit all shift. Made multiple phone calls. Denied suicidal/homicidal thoughts. Social with peers. Safety maintained.

## 2018-07-31 NOTE — Unmapped (Signed)
Occupational Therapy/Therapeutic Recreation Progress Note      Alexie Samson  [id number]      Group: Task  Attendance: Attended  45/45 minutes  Level of Participation: Active    Pt readily attended group upon invitation. Pt completed hand washing hygiene task. Pt had a bright affect and appropriate ADLs. Pt engaged in a riddles task as well as a velvet art coloring task. Pt was able to complete riddles task with minimal assistance. Pt's coloring task was colorful and organized. Pt participated in Engineer, building services with card tricks that peer was engaging in. Pt was able to focus on tasks. Pt interacted with staff and peers during group. Pt did have pressured and hyper verbal speech when interacting with others. Pt did not demonstrate any unsafe behaviors during this group. Pt demonstrated very pleasant and respectful behaviors towards staff and peers throughout group this date.    Rhae Hammock, COTA/L  07/31/2018 11:45 AM

## 2018-07-31 NOTE — Plan of Care (Signed)
Problem: Major Thought Disorder  Goal: DC Criteria-Thought D/O won't neg. affect daily functioning  Intervention: Assess for target symptoms & provide for safety  Description: Assess for target symptoms and provide for safety  Note: Multiple request. Focused on talking to physician about the next time they should meet. Made multiple phone calls that appeared to go well. Cooperative. No physical complaints voiced. Support offered. Safety maintained.  Intervention: Dispense prescribed medication per MD order  Note: Readily compliant medication.  Intervention: Address personal hygiene and care  Note: Showered without prompting.

## 2018-07-31 NOTE — Progress Notes (Signed)
 Pt laying on bed with eyes closed, resp even, easy and unlabored. Will maintain checks q 15 minutes per unit protocol
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
## 2018-08-01 MED FILL — TERAZOSIN 5 MG CAPSULE: 5 5 MG | ORAL | Qty: 2

## 2018-08-01 MED FILL — POLYETHYLENE GLYCOL 3350 17 GRAM ORAL POWDER PACKET: 17 17 gram | ORAL | Qty: 1

## 2018-08-01 MED FILL — HYDROCHLOROTHIAZIDE 25 MG TABLET: 25 25 MG | ORAL | Qty: 1

## 2018-08-01 MED FILL — AMLODIPINE 10 MG TABLET: 10 10 MG | ORAL | Qty: 1

## 2018-08-01 MED FILL — MELATONIN 3 MG TABLET: 3 3 mg | ORAL | Qty: 5

## 2018-08-01 MED FILL — LORATADINE 10 MG TABLET: 10 10 mg | ORAL | Qty: 1

## 2018-08-01 MED FILL — CALCIUM ANTACID 200 MG CALCIUM (500 MG) CHEWABLE TABLET: 200 200 mg calcium (500 mg) | ORAL | Qty: 1

## 2018-08-01 MED FILL — SEROQUEL 200 MG TABLET: 200 200 mg | ORAL | Qty: 1

## 2018-08-01 MED FILL — METFORMIN ER 500 MG TABLET,EXTENDED RELEASE 24 HR: 500 500 MG | ORAL | Qty: 1

## 2018-08-01 NOTE — Nursing Note (Addendum)
1530-assumed care this patient    Patient pleasant and agreeable. No management problem, denies SI/HI/AVH.  Social with peers. Good appetite.    Medicine compliant. To bed at 2100.

## 2018-08-01 NOTE — Plan of Care (Signed)
Left a message for Dr. Durwin Nora to call me on cell phone

## 2018-08-01 NOTE — Progress Notes (Addendum)
HS scheduled melatonin 15 mg effective as pt slept all night until 0500.  0500 Pt got himself a water refill at the wall dispenser. Returned to his room. Quiet.   Pt laying on bed with eyes closed, resp even, easy and unlabored.   Will maintain checks q 15 minutes per unit protocol

## 2018-08-01 NOTE — Plan of Care (Addendum)
Problem: Major Thought Disorder  Goal: DC Criteria-Thought D/O won't neg. affect daily functioning  Note: Pt visible on the unit, social with select peers appropriately. Calm and cooperative with staff. Attended group. Noted making several phone calls throughout the day. Denies suicidal/homicidal ideation. Denies a/v hallucination. Medication compliant, c/o indigestion requested and received PRN Tums x1 per order. No unsafe behaviors noted or reported. Safety maintained for pt, support offered. Will continue to monitor per unit protocol.

## 2018-08-01 NOTE — Progress Notes (Signed)
Occupational Therapy/Therapeutic Recreation Progress Note      Nathaniel Maxwell  [id number]      Group: Task  Attendance: Attended  45/45 minutes  Level of Participation: Active, Interacted with peer(s), Interacted with staff and Followed Directions    Pt readily attended group upon invitation. Pt had brighter affect during group. Pt started on a velvet adopted coloring project and finished a word scramble of the 50 states with min A from staff. Pt less intrusive and able to remain seated for longer periods of time compared to admission. Pt's task completion was 50% organized. Pt demonstrated 75% focus on task. Pt had appropriate interaction with staff and peers. Pt attempted to try to start a conversation with a peer, A.N. but peer was unreceptive and left the room. Pt is progressing toward.       MARIA GRACE SALOMONE, OT  08/01/2018 11:30 AM

## 2018-08-01 NOTE — Progress Notes (Signed)
Occupational Therapy/Therapeutic Recreation Progress Note      Nathaniel Maxwell  [id number]      Group: Wellness  Attendance: Attended  70/70 minutes  Level of Participation: Active, Interacted with peer(s), Interacted with staff, Initiated Comment(s) and Followed Directions    Pt attended group upon invite.  Pt readily attended and actively participated.  He completed the introductions and reported that a lot of his current situation is stressful as he does not like to deal with the young doctors as all they want to do is just keep giving him medications and they are not exploring other things with him.  He stated they do not understand him and never want to talk to him, just offer more medication.  He called his Bipolar, the  Bipolar Beast.  He seems to have dealt with this for years.  He stated the upcoming divorce next month is a stressor.  He stated he does not feel like the things that were said about him by his sister were accurate.  He did do better at waiting his turn, and listening to others, but he remains tangential when he does take a turn to talk.  He does appear to be a little better.  He remained engaged in the discussion and was supportive to peers.      Thea Gist, CTRS  08/01/2018 4:02 PM

## 2018-08-01 NOTE — Group Note (Signed)
Co-Occurring CD Group          Nathaniel Maxwell  [id number]      GROUP TOPIC: CBA    Attendance: Pt refused group.  Time 0  Level of participation: None
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
## 2018-08-02 LAB — DIFFERENTIAL
Basophils Absolute: 50 /uL (ref 0–200)
Basophils Relative: 0.7 % (ref 0.0–1.0)
Eosinophils Absolute: 135 /uL (ref 15–500)
Eosinophils Relative: 1.9 % (ref 0.0–8.0)
Lymphocytes Absolute: 2286 /uL (ref 850–3900)
Lymphocytes Relative: 32.2 % (ref 15.0–45.0)
Monocytes Absolute: 838 /uL (ref 200–950)
Monocytes Relative: 11.8 % (ref 0.0–12.0)
Neutrophils Absolute: 3791 /uL (ref 1500–7800)
Neutrophils Relative: 53.4 % (ref 40.0–80.0)
nRBC: 0 /100{WBCs} (ref 0–0)

## 2018-08-02 MED ORDER — divalproex (DEPAKOTE) DR tablet 750 mg
500 | Freq: Two times a day (BID) | ORAL | Status: AC
Start: 2018-08-02 — End: 2018-08-07
  Administered 2018-08-02 – 2018-08-07 (×11): 750 mg via ORAL

## 2018-08-02 MED FILL — AMLODIPINE 10 MG TABLET: 10 10 MG | ORAL | Qty: 1

## 2018-08-02 MED FILL — TERAZOSIN 5 MG CAPSULE: 5 5 MG | ORAL | Qty: 2

## 2018-08-02 MED FILL — CALCIUM ANTACID 200 MG CALCIUM (500 MG) CHEWABLE TABLET: 200 200 mg calcium (500 mg) | ORAL | Qty: 1

## 2018-08-02 MED FILL — SEROQUEL 200 MG TABLET: 200 200 mg | ORAL | Qty: 1

## 2018-08-02 MED FILL — HYDROCHLOROTHIAZIDE 25 MG TABLET: 25 25 MG | ORAL | Qty: 1

## 2018-08-02 MED FILL — METFORMIN ER 500 MG TABLET,EXTENDED RELEASE 24 HR: 500 500 MG | ORAL | Qty: 1

## 2018-08-02 MED FILL — LORATADINE 10 MG TABLET: 10 10 mg | ORAL | Qty: 1

## 2018-08-02 MED FILL — DIVALPROEX 250 MG TABLET,DELAYED RELEASE: 250 250 MG | ORAL | Qty: 1

## 2018-08-02 MED FILL — MELATONIN 3 MG TABLET: 3 3 mg | ORAL | Qty: 5

## 2018-08-02 MED FILL — POLYETHYLENE GLYCOL 3350 17 GRAM ORAL POWDER PACKET: 17 17 gram | ORAL | Qty: 1

## 2018-08-02 NOTE — Progress Notes (Signed)
[phone]  Assumed care of pt at 2330. Pt sleeping at this time. No distress or discomfort noted this shift. Awaken at 0145 to ask if TPW touched her door. Reminded pt of every 15 minutes check.   Pt went back to sleep.   0530 Pt holding a book and pacing in room. No issues noted. Slept 5 hours.
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
## 2018-08-02 NOTE — Plan of Care (Signed)
Pt pleasant, cooperative, conversant this AM.  Denies thoughts of harm to self or others.  Denies AVH.  Denies pain. Pt with good insight speaking of his bipolar in terms of how to best manage with medications and support systems.  MC.  Social with staff and peers and using phone.  No unsafe or inappropriate behaviors noted. Will continue to monitor for safety and assess for needs.

## 2018-08-02 NOTE — Plan of Care (Signed)
Problem: Major Mood Disorder  Goal: DC Criteria-Mania/depression won't neg affect daily function  Intervention: Other psychotherapeutic interventions (describe)  Description: Other psychotherapeutic interventions (describe)  Note: Phone call from patient's [facility] case manager Horton Marshall [phone]. [phone]). She states that she was just assigned to be patient's case Production designer, theatre/television/film. She will be in contact with him while he is in the hospital and will be following up with him upon discharge.   Met with patient on the unit. Gave patient name and contact information for his newly assigned case manager. Patient apologized for his behavior yesterday. States that he knows he has issues with how he presents himself at times and can get a little intense. Patient in pleasant mood today.

## 2018-08-02 NOTE — Progress Notes (Signed)
Occupational Therapy/Therapeutic Recreation Progress Note      Nathaniel Maxwell  [id number]      Group: Task  Attendance: Attended  45/45 minutes  Level of Participation: Active, Interacted with peer(s), Interacted with staff and Followed Directions    Pt readily attended group upon invitation. Pt had euthymicaffect during group. Pt started on a game of Spot It and Connect Four with peer JM. Pt then put in headphones and searched for music on the computer. Pt requested and able to make needs known for pen and paper to write down directions. Pt had Google Maps pulled up on the computer for possible apartment or community resource. task during group. Pt's task completion was 50% organized. Pt demonstrated 50% focus on task. Pt still has some pressured speech and difficulty attending to one task for long periods of time. Pt had more appropriate interaction with staff and peers.     MARIA GRACE SALOMONE, OT  08/02/2018 1:01 PM

## 2018-08-02 NOTE — Progress Notes (Signed)
[id number]  Pt cooperative and composed than before, making multiple calls, excited about his blood presure this morning and his last night sleep. Encouraged pt to keep it up. Denies SI/HI/AVH. No unsafe behavior noted. ABS = 0. Safety checks in placed.

## 2018-08-02 NOTE — Progress Notes (Signed)
Occupational Therapy/Therapeutic Recreation Progress Note      Nathaniel Maxwell  [id number]      Group: Leisure Ed  Attendance: Attended  45/45 minutes  Level of Participation: Active, Interacted with peer(s), Interacted with staff, Remained on Task/Topic/Number of Minutes: 45, Initiated Comment(s) and Followed Directions    Pt readily attended group with anticipating group to start.  Pt was active with discussion on leisure.  ID interest of playing games with his nephews, enjoys being around people.  Pt was familiar with [facility] and shared he had attended before.  With structured activity of Leisure Scattegories  Pt participated as instructed.   He followed the games rules as ID Leisure of a select letter, some responses were far fetched as relating to the question.  Pt was loud with laughing often, still displays elevated mood.   Pt seems to enjoy company of others.  Pt seems unsure of where he will live after discharge.     Eilyn Polack, CTRS  08/02/2018 3:33 PM

## 2018-08-03 MED FILL — DIVALPROEX 250 MG TABLET,DELAYED RELEASE: 250 250 MG | ORAL | Qty: 1

## 2018-08-03 MED FILL — MELATONIN 3 MG TABLET: 3 3 mg | ORAL | Qty: 5

## 2018-08-03 MED FILL — LORATADINE 10 MG TABLET: 10 10 mg | ORAL | Qty: 1

## 2018-08-03 MED FILL — SEROQUEL 200 MG TABLET: 200 200 mg | ORAL | Qty: 1

## 2018-08-03 MED FILL — HYDROCHLOROTHIAZIDE 25 MG TABLET: 25 25 MG | ORAL | Qty: 1

## 2018-08-03 MED FILL — METFORMIN ER 500 MG TABLET,EXTENDED RELEASE 24 HR: 500 500 MG | ORAL | Qty: 1

## 2018-08-03 MED FILL — TERAZOSIN 5 MG CAPSULE: 5 5 MG | ORAL | Qty: 2

## 2018-08-03 MED FILL — POLYETHYLENE GLYCOL 3350 17 GRAM ORAL POWDER PACKET: 17 17 gram | ORAL | Qty: 1

## 2018-08-03 MED FILL — AMLODIPINE 10 MG TABLET: 10 10 MG | ORAL | Qty: 1
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
## 2018-08-03 NOTE — Plan of Care (Signed)
Problem: Major Mood Disorder  Goal: DC Criteria-Mania/depression won't neg affect daily function  Intervention: Other psychotherapeutic interventions (describe)  Description: Other psychotherapeutic interventions (describe)  Note: Met with patient on the unit. Patient pleasant, good mood. Still some mania.  Patient will have Depakote level taken on Monday. If patient continues to improve looking at d/c for next week.

## 2018-08-03 NOTE — Unmapped (Signed)
[id number]  Pt sleeping at start of shift. No s/s of distress or discomfort noted. Respiration even and unlabored. No PRN medication given this shift. Awaken at 0600 to make a call for prayer line but to no avail. Slept all night.

## 2018-08-03 NOTE — Plan of Care (Signed)
Patient has attended all groups that were available to him during this assessment window. He additionally engaged in a 1:1 visit with COTA/L and expressed gratitude following. Patient has displayed decreased intrusiveness, pressured speech and tangential thoughts this week. He is increasingly focused and able to follow task directions in an organized manner. Patient has participated in a variety of social and leisure activities, as well as educational and stress relieving groups. He met a social goal that was established and is progressing in other OT goal areas. Remaining goals amended this date. Patient status is regularly discussed with the IDT. Will continue as per updated POC.

## 2018-08-03 NOTE — Nursing Note (Addendum)
Assumed care of patient at 18. Patient sitting in dining area interacting with TPW and watching TV. Denies SI/HI/AVH. Blood pressure elevated 105/102. TPW retook blood pressure at 2140 it was 144/92. Medication compliant and safety checks maintained per units protocol.

## 2018-08-03 NOTE — Progress Notes (Signed)
Pt pleasant. Cooperative. Highly visible, restless, somewhat manic, frequent requests, no safety/mgt issues. Pt denies any SI/HI/AVH

## 2018-08-03 NOTE — Progress Notes (Signed)
Occupational Therapy/Therapeutic Recreation Progress Note      Nathaniel Maxwell  [id number]      Group: Task  Attendance: Attended  60/60 minutes  Level of Participation: Active, Interacted with peer(s), Interacted with staff, Initiated Comment(s) and Followed Directions    Patient was slightly restless during session, but manageable. He was compliant with all infection control requests for mask wearing and hand hygiene. He needed only minimal cueing to remain socially distanced from peers. Patient moved between computer use and completion of a word puzzle during session. He offered topical comments to peers and staff, and his thoughts appeared organized. Patient was pleasant and friendly throughout.      Schuyler Amor, OTR/L  08/03/2018 12:19 PM

## 2018-08-03 NOTE — Unmapped (Signed)
[facility]  Medical Nutrition Therapy    Reason(s) for Completion: Nutrition Services Protocol    Diet Order/Nutrition Support: Vegetarian    Pertinent Information: Pt is a 43 y.o. Black or African American male who was admitted to the psychiatric inpatient unit for Bipolar Disorder.  Staff documenting po intakes as good. Spoke with the pt out on the unit where he reports a good appetite and tolerance of meals. Denies nausea, +BM.    Patient Active Problem List   Diagnosis   ??? Essential hypertension   ??? Seasonal allergic rhinitis due to pollen   ??? Type 2 diabetes mellitus without complication, without long-term current use of insulin (CMS Dx)   ??? Chronic pain of left knee   ??? Bipolar affective disorder, manic, severe (CMS Dx)     Past Medical History:   Diagnosis Date   ??? Bipolar affective disorder (CMS Dx)    ??? HTN (hypertension)     age 69   ??? Mycosis fungoides (CMS Dx)        Scheduled Meds:   ??? amLODIPine  10 mg Oral Daily 0900   ??? divalproex  750 mg Oral 2 times per day   ??? hydroCHLOROthiazide  25 mg Oral Daily 0900   ??? loratadine  10 mg Oral Daily 0900   ??? melatonin  15 mg Oral Nightly (2100)   ??? metFORMIN  500 mg Oral Daily with breakfast   ??? neomycin-bacitracin-polymyxin   Topical Daily 0900   ??? polyethylene glycol  17 g Oral Daily 0900   ??? QUEtiapine  1,000 mg Oral Nightly (2100)   ??? terazosin  10 mg Oral Nightly (2100)      Continuous Infusions:   PRN Meds:acetaminophen, benztropine, benztropine mesylate, bismuth subsalicylate, calcium carbonate, haloperidol lactate, hydrALAZINE, LORazepam, LORazepam, LORazepam, magnesium hydroxide, nicotine (polacrilex), risperiDONE M-TAB **OR** risperiDONE M-TAB, simethicone, ziprasidone **AND** sterile water, traZODone     Pertinent Labs:   Lab Results   Component Value Date    GLUCOSE 130 (H) 07/25/2018    BUN 11 07/25/2018    CO2 26 07/25/2018    CREATININE 0.89 07/25/2018    K 4.2 07/25/2018    NA 135 07/25/2018    CL 101 07/25/2018     CALCIUM 9.6 07/25/2018    ALBUMIN 4.5 07/22/2018    PROT 7.8 07/22/2018    ALKPHOS 79 07/22/2018    ALT 31 07/22/2018    AST 50 (H) 07/22/2018    BILITOT 0.3 07/22/2018     Lab Results   Component Value Date    MG 2.1 07/20/2018       Skin Integrity: intact    Potential Nutrition Related Factor(s):  Altered Mental Status  Food Allergies/Intolerances: NKFA  Cultural Requests: none    44 y.o.   Male   Ht Readings from Last 1 Encounters:   07/20/18 5' 9 (1.753 m)     Wt Readings from Last 1 Encounters:   07/27/18 (!) 229 lb (103.9 kg)      Body mass index is 33.82 kg/m??.   Usual Weight: UTO  Other: IBW 160 lb  Weight History:   Wt Readings from Last 30 Encounters:   07/27/18 (!) 229 lb (103.9 kg)   03/07/18 (!) 239 lb 4.8 oz (108.5 kg)   12/07/17 (!) 232 lb (105.2 kg)   11/13/17 (!) 238 lb 9.6 oz (108.2 kg)       Nutrition Related Problems:   Nutrition Diagnosis: No Nutrition Diagnosis  Estimated Nutrition Needs:   Not Applicable    Recommended Interventions: Not Applicable    Goals:Not Applicable    Nutrition Status Classification: Normal    Nutrition Discharge Planning: Will monitor POC and provide services as needed in preparation for discharge.    Follow up per inpatient nutrition protocol.    Sharene Skeans MS, RD, LD  [phone]

## 2018-08-03 NOTE — Plan of Care (Signed)
43 year old, bipolar, admit with manic, started depakote yesterday,  seroquel hs, speech is hyper verbal.  Doing labs Monday- ELS mid next week?

## 2018-08-03 NOTE — Progress Notes (Signed)
Provided Pastoral support for patient. No further action needed at this time.      Rev. Kennith Gain, D.Min, LICDC-CS  [facility]  [phone]  phone  [phone] beeper

## 2018-08-03 NOTE — Plan of Care (Signed)
Patient has not displayed aggressive behaviors toward others during therapy groups. Will continue to monitor thias area and report out as indicated.

## 2018-08-03 NOTE — Progress Notes (Signed)
1500-1900 Pt more visible, frequent requests, pt denies any SI/HI/AVH, safety maintained

## 2018-08-03 NOTE — Unmapped (Deleted)
Pt pleasant. Cooperative. Highly visible, restless, somewhat manic, frequent requests, no safety/mgt issues. Pt denies any SI/HI/AVH

## 2018-08-04 MED FILL — HYDROCHLOROTHIAZIDE 25 MG TABLET: 25 25 MG | ORAL | Qty: 1

## 2018-08-04 MED FILL — AMLODIPINE 10 MG TABLET: 10 10 MG | ORAL | Qty: 1

## 2018-08-04 MED FILL — METFORMIN ER 500 MG TABLET,EXTENDED RELEASE 24 HR: 500 500 MG | ORAL | Qty: 1

## 2018-08-04 MED FILL — TERAZOSIN 5 MG CAPSULE: 5 5 MG | ORAL | Qty: 2

## 2018-08-04 MED FILL — MELATONIN 3 MG TABLET: 3 3 mg | ORAL | Qty: 5

## 2018-08-04 MED FILL — LORAZEPAM 1 MG TABLET: 1 1 MG | ORAL | Qty: 1

## 2018-08-04 MED FILL — DIVALPROEX 250 MG TABLET,DELAYED RELEASE: 250 250 MG | ORAL | Qty: 1

## 2018-08-04 MED FILL — LORATADINE 10 MG TABLET: 10 10 mg | ORAL | Qty: 1

## 2018-08-04 MED FILL — PEPTO-BISMOL 262 MG CHEWABLE TABLET: 262 262 mg | ORAL | Qty: 2

## 2018-08-04 MED FILL — SIMETHICONE 80 MG CHEWABLE TABLET: 80 80 MG | ORAL | Qty: 1

## 2018-08-04 MED FILL — POLYETHYLENE GLYCOL 3350 17 GRAM ORAL POWDER PACKET: 17 17 gram | ORAL | Qty: 1

## 2018-08-04 NOTE — Nursing Note (Signed)
Night Shift Nursing Note ([id number])    Nathaniel Maxwell, LOS: 15  Bipolar affective disorder, manic, severe (CMS Dx)    Vital signs in last 24 hours:   Vitals:    08/03/18 2140   BP: (!) 144/92   Pulse:    Resp:    Temp:    SpO2:        Overnight Assessment:  Pt rested in bed, respirations unlabored and even. Slept 6 hrs  Pt has not required PRN medications.  No unsafe behaviors observed to this time.  Safety being maintained per unit protocol.   Will continue to monitor for safety and needs this shift.    Nathaniel Layne Benton, RN  08/04/2018

## 2018-08-04 NOTE — Unmapped (Signed)
Occupational Therapy/Therapeutic Recreation Progress Note      Nathaniel Maxwell  [id number]      Group: Task  Attendance: Attended  45/45 minutes  Level of Participation: Active    Pt readily attended group upon invitation. Pt completed hand washing hygiene task. Pt had appropriate affect and ADLs. Pt engaged in learning a new card game task with staff and peer. Pt was able to pick up on instructions to task quickly. Pt had good patience with peer that was having difficulty learning the game. Pt also engaged in listening to music during group. Pt demonstrated good focus on tasks. Pt interacted with others and was very pleasant. Pt did not demonstrate hyper verbal or pressured speech during this group. Pt did not require any redirection from staff or demonstrate intrusive behaviors with peers. Pt did not demonstrate any manic or unsafe behaviors during this group. Pt demonstrated appropriate and respectful behaviors towards staff and peers throughout group this date.    Rhae Hammock, COTA/L  08/04/2018 4:40 PM
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
## 2018-08-04 NOTE — Progress Notes (Addendum)
Pt visible on the unit, restless/anxious, med compliant. Pt denies any SI/HI/AVH, constantly ruminates on the time he was placed in the locked seclusion area. No unsafe behaviors noted

## 2018-08-04 NOTE — Progress Notes (Signed)
1500-1930  Pt has been visible on the unit, social with select male peers. Up to the desk once for request. Denies SI/HI/AVH. Eating well, will continue to monitor per unit protocol.

## 2018-08-04 NOTE — Progress Notes (Signed)
[id number]    Patient out on unit watching TV and making needs known.  Social with staff and select peers.   Restless but cooperative.  Denies suicidal/homicidal ideation, and auditory/visual hallucinations.     Attended and participated in Task group earlier today per OT note.  No unsafe/inappropriate behaviors observed.  Independent with activities of daily living, and practicing adequate hygiene routine.      Compliant with all scheduled medication.  Patient requested several different PRNs throughout the evening.  PRN medication administered this shift; 2103 - simethicone 80 mg for c/o flatulence, effective.  2112 - Ativan 1 mg for c/o anxiety, effective.  2203 - Pepto-Bismol, effective.  Will continue to monitor.  Safety maintained per unit protocol.     Patient Vitals for the past 24 hrs:   BP Temp Temp src Pulse Resp SpO2   08/04/18 1600 148/86 98.3 F (36.8 C) Oral 102 18 100 %   08/04/18 0805 (!) 135/91 97.8 F (36.6 C) Oral 101 18 100 %     Scheduled Medications   amLODIPine  10 mg Oral Daily 0900    divalproex  750 mg Oral 2 times per day    hydroCHLOROthiazide  25 mg Oral Daily 0900    loratadine  10 mg Oral Daily 0900    melatonin  15 mg Oral Nightly (2100)    metFORMIN  500 mg Oral Daily with breakfast    neomycin-bacitracin-polymyxin   Topical Daily 0900    polyethylene glycol  17 g Oral Daily 0900    QUEtiapine  1,000 mg Oral Nightly (2100)    terazosin  10 mg Oral Nightly (2100)
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
## 2018-08-05 MED ORDER — pantoprazole (PROTONIX) EC tablet 40 mg
40 | Freq: Every day | ORAL | Status: AC
Start: 2018-08-05 — End: 2018-08-07
  Administered 2018-08-05 – 2018-08-07 (×3): 40 mg via ORAL

## 2018-08-05 MED FILL — MELATONIN 3 MG TABLET: 3 3 mg | ORAL | Qty: 5

## 2018-08-05 MED FILL — LORATADINE 10 MG TABLET: 10 10 mg | ORAL | Qty: 1

## 2018-08-05 MED FILL — SIMETHICONE 80 MG CHEWABLE TABLET: 80 80 MG | ORAL | Qty: 1

## 2018-08-05 MED FILL — PANTOPRAZOLE 40 MG TABLET,DELAYED RELEASE: 40 40 MG | ORAL | Qty: 1

## 2018-08-05 MED FILL — SEROQUEL 200 MG TABLET: 200 200 mg | ORAL | Qty: 1

## 2018-08-05 MED FILL — LORAZEPAM 1 MG TABLET: 1 1 MG | ORAL | Qty: 1

## 2018-08-05 MED FILL — POLYETHYLENE GLYCOL 3350 17 GRAM ORAL POWDER PACKET: 17 17 gram | ORAL | Qty: 1

## 2018-08-05 MED FILL — HYDROCHLOROTHIAZIDE 25 MG TABLET: 25 25 MG | ORAL | Qty: 1

## 2018-08-05 MED FILL — TERAZOSIN 5 MG CAPSULE: 5 5 MG | ORAL | Qty: 2

## 2018-08-05 MED FILL — METFORMIN ER 500 MG TABLET,EXTENDED RELEASE 24 HR: 500 500 MG | ORAL | Qty: 1

## 2018-08-05 MED FILL — HYDRALAZINE 10 MG TABLET: 10 10 MG | ORAL | Qty: 1

## 2018-08-05 MED FILL — DIVALPROEX 250 MG TABLET,DELAYED RELEASE: 250 250 MG | ORAL | Qty: 1

## 2018-08-05 MED FILL — AMLODIPINE 10 MG TABLET: 10 10 MG | ORAL | Qty: 1

## 2018-08-05 NOTE — Unmapped (Signed)
Occupational Therapy/Therapeutic Recreation Progress Note      Nathaniel Maxwell  [id number]      Group: Task  Attendance: Attended  60/60 minutes  Level of Participation: Active    Pt readily attended group upon invitation. Pt completed hand washing hygiene task. Pt had appropriate affect and ADLs. Pt engaged in playing the same card game as yesterday with staff and peer. Pt also listened to music during task. Pt was able to follow instructions to task accurately and assist peer when needed. Pt demonstrated good focus on task entire time. Pt appeared lethargic this date, although Pt stated he is sleeping much better now. Pt did not demonstrate any pressured speech or hyper verbal behaviors. When asked if he felt any better since admission, Pt stated he feels the same. Pt asked Writer what differences Writer has noticed since his admission. Writer informed Pt he appeared less manic, less disruptive, less intrusive, and decreased pressured speech. Pt acknowledged response and agreed. Pt interacted with others and was very pleasant in conversations. Pt did not require any redirection or demonstrate any unsafe behaviors during this group. Pt demonstrated appropriate and respectful behaviors towards staff and peers throughout group this date.    Rhae Hammock, COTA/L  08/05/2018 3:58 PM

## 2018-08-05 NOTE — Plan of Care (Addendum)
Problem: Major Thought Disorder  Goal: DC Criteria-Thought D/O won't neg. affect daily functioning  Outcome: Progressing  Patient visible on the unit, observed in the dinning area watching television and interacting with peers. Tangential thoughts. Denies SI/HI/AVH. Compliant with scheduled medications. PRN Mylicon tablet given for c/o gas with somewhat effective results. Reports a good appetite. Attended group. No unsafe/inappropriate behaviors noted. Safety maintained per unit protocol. Will continue 15 minutes checks.     Vitals:    08/04/18 1600 08/05/18 0708   BP: 148/86 (!) 136/91   Pulse: 102 102   Resp: 18 18   Temp: 98.3 F (36.8 C) 97.5 F (36.4 C)   SpO2: 100% 100%       Scheduled Meds:   amLODIPine  10 mg Oral Daily 0900    divalproex  750 mg Oral 2 times per day    hydroCHLOROthiazide  25 mg Oral Daily 0900    loratadine  10 mg Oral Daily 0900    melatonin  15 mg Oral Nightly (2100)    metFORMIN  500 mg Oral Daily with breakfast    neomycin-bacitracin-polymyxin   Topical Daily 0900    polyethylene glycol  17 g Oral Daily 0900    QUEtiapine  1,000 mg Oral Nightly (2100)    terazosin  10 mg Oral Nightly (2100)
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
## 2018-08-05 NOTE — Progress Notes (Signed)
[id number]    Patient rested in bed with even and unlabored respirations.  He slept for approximately 6.5 hours.   No scheduled or PRN medications this shift.  No unsafe or inappropriate behaviors observed.  Safety maintained per unit protocol.

## 2018-08-05 NOTE — Nursing Note (Addendum)
1530-assumed care this patient    Patient watching television in DR[employer]. Given shower supplies, showering now.     At 1740, patient BP,161/101. Given po/prn Apresoline 10mg . No other somatic complaints.Will recheck in one hour.    1830- BP unchanged. Pt endorsed mild anxiety. Given Ativan 1mg  now.    2000- BP 152/107. Pt denies any somatic symptoms. Will page POD.    2100-POD aware of this. No new orders. Directed writer to document this for treatment team. Patient resting in room at this time.

## 2018-08-06 LAB — COMPREHENSIVE METABOLIC PANEL
ALT: 15 U/L (ref 7–52)
AST: 16 U/L (ref 13–39)
Albumin: 4.1 g/dL (ref 3.5–5.7)
Alkaline Phosphatase: 75 U/L (ref 36–125)
Anion Gap: 7 mmol/L (ref 3–16)
BUN: 11 mg/dL (ref 7–25)
CO2: 26 mmol/L (ref 21–33)
Calcium: 9.1 mg/dL (ref 8.6–10.3)
Chloride: 105 mmol/L (ref 98–110)
Creatinine: 0.85 mg/dL (ref 0.60–1.30)
Glucose: 105 mg/dL — ABNORMAL HIGH (ref 70–100)
Osmolality, Calculated: 286 mosm/kg (ref 278–305)
Potassium: 4.2 mmol/L (ref 3.5–5.3)
Sodium: 138 mmol/L (ref 133–146)
Total Bilirubin: 0.4 mg/dL (ref 0.0–1.5)
Total Protein: 6.9 g/dL (ref 6.4–8.9)
eGFR AA CKD-EPI: >90 See note.
eGFR NONAA CKD-EPI: >90 See note.

## 2018-08-06 LAB — CBC
Hematocrit: 36.1 % — ABNORMAL LOW (ref 38.5–50.0)
Hemoglobin: 11.9 g/dL — ABNORMAL LOW (ref 13.2–17.1)
MCH: 25.7 pg — ABNORMAL LOW (ref 27.0–33.0)
MCHC: 32.8 g/dL (ref 32.0–36.0)
MCV: 78.4 fL — ABNORMAL LOW (ref 80.0–100.0)
MPV: 7.5 fL (ref 7.5–11.5)
Platelets: 325 10E3/uL (ref 140–400)
RBC: 4.61 10E6/uL (ref 4.20–5.80)
RDW: 16.1 % — ABNORMAL HIGH (ref 11.0–15.0)
WBC: 4.6 10E3/uL (ref 3.8–10.8)

## 2018-08-06 LAB — VALPROIC ACID LEVEL, TOTAL: Valproic Acid Lvl: 87 ug/mL (ref 50–100)

## 2018-08-06 MED ORDER — QUEtiapine (SEROQUEL) tablet 900 mg
300 | Freq: Every evening | ORAL | Status: AC
Start: 2018-08-06 — End: 2018-08-07
  Administered 2018-08-07: 900 mg via ORAL

## 2018-08-06 MED FILL — POLYETHYLENE GLYCOL 3350 17 GRAM ORAL POWDER PACKET: 17 17 gram | ORAL | Qty: 1

## 2018-08-06 MED FILL — PANTOPRAZOLE 40 MG TABLET,DELAYED RELEASE: 40 40 MG | ORAL | Qty: 1

## 2018-08-06 MED FILL — DIVALPROEX 250 MG TABLET,DELAYED RELEASE: 250 250 MG | ORAL | Qty: 1

## 2018-08-06 MED FILL — METFORMIN ER 500 MG TABLET,EXTENDED RELEASE 24 HR: 500 500 MG | ORAL | Qty: 1

## 2018-08-06 MED FILL — AMLODIPINE 10 MG TABLET: 10 10 MG | ORAL | Qty: 1

## 2018-08-06 MED FILL — HYDROCHLOROTHIAZIDE 25 MG TABLET: 25 25 MG | ORAL | Qty: 1

## 2018-08-06 MED FILL — TERAZOSIN 5 MG CAPSULE: 5 5 MG | ORAL | Qty: 2

## 2018-08-06 MED FILL — LORATADINE 10 MG TABLET: 10 10 mg | ORAL | Qty: 1

## 2018-08-06 MED FILL — MELATONIN 3 MG TABLET: 3 3 mg | ORAL | Qty: 5

## 2018-08-06 MED FILL — QUETIAPINE 300 MG TABLET: 300 300 MG | ORAL | Qty: 3

## 2018-08-06 NOTE — Plan of Care (Signed)
Problem: Major Mood Disorder  Goal: DC Criteria-Mania/depression won't neg affect daily function  Intervention: Other psychotherapeutic interventions (describe)  Description: Other psychotherapeutic interventions (describe)  Note: Met with patient on the unit to discuss discharge planning. Patient unclear about where he will be going upon discharge. He has [facility] for mental health follow-up and Dr. Durwin Maxwell for his medical follow-up. Patient cannot return to his sister's. Patient states that he can go to a shelter, stay in a hotel, or even stay in his car. States he has a lot of things he needs to do when he gets out but I'm not worried about where I lay my head.  Spoke with patient' sister Nathaniel Maxwell [phone]). She is concerned about patient's possible upcoming discharge. She states that patient is still pretty manic - he continues to call family quite a bit. His speech is disorganized at times still. Mood can be irritable at times as well. She is also concerned as patient has no real plan for where he is going to stay when he is discharged. Discussed that Crisis Stabilization may be an option but patient would have to agree to it.  Met with patient in his room with MD. Discussed Crisis Stabilization option with patient. Patient declined, states that he knows family is worried about him but he feels he just needs to moved forward with his plans as as long as he follows up with [facility] and with Dr. Durwin Maxwell that he will be ok. Encouraged patient to at least continue to think about the Crisis Stabilization option.

## 2018-08-06 NOTE — Progress Notes (Signed)
[id number]  Assumed care of pt at 2330. Pt sleeping at start of shift. No s/s of distress or discomfort noted. Respiration even and unlabored. No PRN medication given this shift. Awaken this morning. Slept all night.

## 2018-08-06 NOTE — Progress Notes (Signed)
Occupational Therapy/Therapeutic Recreation Progress Note      Nathaniel Maxwell  [id number]      Group: Wellness  Attendance: Attended  75/75 minutes  Level of Participation: Active, Interacted with peer(s), Interacted with staff and Followed Directions    Pt readily attended DBT processing group, appropriate ADLs and affect. Pt more pressured in responses with higher level processing task. Pt able to engage in task, good focus when completing task but demoed hypergraphia on written activity. It does not appear pt will continue with prescribed medications after DC stating he does not feel his presentation is different compared to earlier on in admission and stating 'well I have a decision to make about that when I DC.' Pt denies substance abuse in community and going off prescribed medications but readily admits to bipolar diagnosis for 26 years. Pt able to ID his anger as something to work on, stating he has kept this under control while hospitalized. Pt also shared he can work on being narcissistic, but did present as dominating throughout group. Overall, pt still manic at times and appears to be dismissive of follow through with OP treatment. No management issues noted.      Donnal Debar, OTR/L  08/06/2018 2:59 PM

## 2018-08-06 NOTE — Progress Notes (Signed)
Occupational Therapy/Therapeutic Recreation Progress Note      Erin Uecker  [id number]      Group: Task  Attendance: Attended  60/60 minutes  Level of Participation: Moderate, Interacted with staff and Followed Directions    Pt readily attended group, found waiting outside group space prior to group. Pt with appropriate ADLs and euthymic presentation. Pt demoed calm and cooperative behaviors throughout group, engaged in time on the computer appropriately. Pt searched 'boxing gyms near me,' '14 ideas to combat anxiety,' 'muay thai,' and 'craft ideas for mental health.' Pt appeared energized but without rapid/pressured speech. Overall, presentation appears to be improving with pt able to focus on tasks. No management issues noted, progressing toward goals.     Donnal Debar, OTR/L  08/06/2018 12:41 PM

## 2018-08-06 NOTE — Nursing Note (Signed)
Assumed care of patient at 15. Patient seen visible sitting in dining area eating snack and watching TV. Denies SI/HI/AVH. Medication compliant and safety checks maintained per units protocol.
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
## 2018-08-06 NOTE — Progress Notes (Signed)
Pt visible on the unit, playing cards with a peer in the dining room. Pt presents with an anxious affect, he is readily med compliant, he has rapid/pressured speech, overall he is calmer in presentation. Pt denies any SI/HI/AVH, conversational content is appropriate and organized. No unsafe behaviors noted, will continue to monitor pt for safety and needs

## 2018-08-07 MED ORDER — hydroCHLOROthiazide (HYDRODIURIL) 25 MG tablet
25 | ORAL_TABLET | Freq: Every day | ORAL | 0 refills | 90.00000 days | Status: AC
Start: 2018-08-07 — End: 2018-10-29
  Filled 2018-08-07: qty 15, 15d supply, fill #0

## 2018-08-07 MED ORDER — divalproex (DEPAKOTE) 250 MG DR tablet
250 | ORAL_TABLET | Freq: Two times a day (BID) | ORAL | 0 refills | Status: AC
Start: 2018-08-07 — End: 2018-09-06
  Filled 2018-08-07: qty 90, 15d supply, fill #0

## 2018-08-07 MED ORDER — melatonin 3 mg Tab
3 | ORAL_TABLET | Freq: Every evening | ORAL | 0 refills | Status: AC
Start: 2018-08-07 — End: 2018-09-06
  Filled 2018-08-07: qty 75, 15d supply, fill #0

## 2018-08-07 MED ORDER — terazosin (HYTRIN) 10 MG capsule
10 | ORAL_CAPSULE | Freq: Every evening | ORAL | 0 refills | Status: AC
Start: 2018-08-07 — End: 2018-08-14
  Filled 2018-08-07: qty 15, 15d supply, fill #0

## 2018-08-07 MED ORDER — benztropine (COGENTIN) 0.5 MG tablet
0.5 | ORAL_TABLET | Freq: Two times a day (BID) | ORAL | 0 refills | Status: AC | PRN
Start: 2018-08-07 — End: 2019-02-21
  Filled 2018-08-07: qty 30, 15d supply, fill #0

## 2018-08-07 MED ORDER — metFORMIN (GLUCOPHAGE-XR) 500 MG 24 hr tablet
500 | ORAL_TABLET | Freq: Every day | ORAL | 0 refills | Status: AC
Start: 2018-08-07 — End: ?
  Filled 2018-08-07: qty 15, 15d supply, fill #0

## 2018-08-07 MED ORDER — amLODIPine (NORVASC) 10 MG tablet
10 | ORAL_TABLET | Freq: Every day | ORAL | 0 refills | Status: AC
Start: 2018-08-07 — End: 2018-10-29
  Filled 2018-08-07: qty 15, 15d supply, fill #0

## 2018-08-07 MED ORDER — QUEtiapine (SEROQUEL) 300 MG tablet
300 | ORAL_TABLET | Freq: Every evening | ORAL | 0 refills | Status: AC
Start: 2018-08-07 — End: 2018-09-06
  Filled 2018-08-07: qty 45, 15d supply, fill #0

## 2018-08-07 MED FILL — PANTOPRAZOLE 40 MG TABLET,DELAYED RELEASE: 40 40 MG | ORAL | Qty: 1

## 2018-08-07 MED FILL — POLYETHYLENE GLYCOL 3350 17 GRAM ORAL POWDER PACKET: 17 17 gram | ORAL | Qty: 1

## 2018-08-07 MED FILL — METFORMIN ER 500 MG TABLET,EXTENDED RELEASE 24 HR: 500 500 MG | ORAL | Qty: 1

## 2018-08-07 MED FILL — AMLODIPINE 10 MG TABLET: 10 10 MG | ORAL | Qty: 1

## 2018-08-07 MED FILL — DIVALPROEX 250 MG TABLET,DELAYED RELEASE: 250 250 MG | ORAL | Qty: 1

## 2018-08-07 MED FILL — HYDROCHLOROTHIAZIDE 25 MG TABLET: 25 25 MG | ORAL | Qty: 1

## 2018-08-07 MED FILL — LORATADINE 10 MG TABLET: 10 10 mg | ORAL | Qty: 1

## 2018-08-07 MED FILL — TERAZOSIN 5 MG CAPSULE: 5 5 MG | ORAL | Qty: 2
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
## 2018-08-07 NOTE — Plan of Care (Signed)
Problem: Major Thought Disorder  Goal: DC Criteria-Thought D/O won't neg. affect daily functioning  Outcome: Progressing  Pt. Appeared well- oriented, organized, showered, and well kempt. Pt was pleasant, calm, and cooperative. He denied having any SI/HI/. Pt. Denies having any homicidal or suicidal ideations. He took his morning medications willingly. Pt. Vitals were WNL and his ABS score was 0.  Pt is visible on the unit and will be monitored for safety per unit protocol. Pt. Agreed to contact staff if there was any change in status.

## 2018-08-07 NOTE — Nursing Note (Signed)
Night Shift Nursing Note ([id number])    Nathaniel Maxwell, LOS: 18  Bipolar affective disorder, manic, severe (CMS Dx)    Vital signs in last 24 hours:   Vitals:    08/06/18 1911   BP: 150/89   Pulse: 97   Resp: 18   Temp: 97.6 F (36.4 C)   SpO2: 100%       Overnight Assessment:  Pt rested in bed, respirations unlabored and even.Compliant with 0600 medication.  Pt has not required PRN medications.  No unsafe behaviors observed to this time.  Safety being maintained per unit protocol.   Will continue to monitor for safety and needs this shift.    [name], RN  08/07/2018
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
## 2018-08-07 NOTE — Unmapped (Signed)
[place]  Psychiatric Social Work   Discharge Summary Note    Family/Significant Other Communication:  Family/Support Person: Called(Discharge coordinated with patient's sister and with [employer])    Discharge Information:  Recommended Treatment: Case Management, Individual     Discharge Disposition: Shelter  Discharge Legal Status: Notice to court sent      Transition of Care:  Discharge Summary Faxed? )   Date:   Time:   Fax #:    Agency/Provider Name:    Contact#:      Barriers to Transition of Care: None      Follow-up Appointments:  [facility] VP  [address]  [place] [address]  On 08/13/2018  You have an appointment with your [facility] therapist William Dalton on Monday 08/13/18 at 10:00 AM. You also have an appointment with the CNP Cleo Enakhena on Tuesday 08/14/18 at 2:00 PM. Both of these appointments are at the [facility] office.      Discharge Instructions:  You have an appointment with your [facility] therapist William Dalton on Monday 08/13/18 at 10:00 AM. You also have an appointment with the CNP Cleo Enakhena on Tuesday 08/14/18 at 2:00 PM. Both of these appointments are at the [facility] office.  Contact your [facility] case manager Jaekwon Mcclune at [phone] ext. [phone] for assistance with housing or any other needs you may have.    [facility] VP  [address] [place] [address]    CAP Line/Intake Process  If you need shelter, call the [place] shelter hotline (513) 381-SAFE [phone]). This is the [employer] (CAP) a centralized intake system for families and individuals who are currently experiencing homelessness or who are at risk of becoming homeless. Intake Specialists screen callers based on their immediacy of need and make appropriate referrals into a partner agency???s program. Be prepared for a wait time. Unfortunately, due to the high need, [place] shelters are frequently full.      Shelters Men   [place]  [facility]  [phone]    [address], [place], [place] [address]    [facility] [address] [facility]   [phone]    Shel[facility]house (formerly [facility])     [phone]  [address] [place], [place] [address]               [facility]                              [phone]                     [address] [place], [place] [address]   Inexpensive Housing Options   [place]     [phone]   [facility] Sk[facility]     [phone]     Should your symptoms return or worsen you can contact your [facility] case manager Vishaal Strollo at [phone] ext. [phone]. You can also call 281-CARE or if you need an immediate evaluation you can go to [facility] ([facility] which is located at [address], [place], [place] [address].        Modest Draeger C Gaylia Kassel  08/07/2018

## 2018-08-07 NOTE — Progress Notes (Signed)
Pt discharged at 1315 with medications. Signed AVS. papers. Denies pain, SI/HI. Verbalized understanding of future appointment with [facility] and how to take medications at home. Received 2 metro tickets for transportation. Received cloths and shoes from the clothing sore since patient did not bring any belongings to the hospital. Patient was thankful to staff for providing cloths. Escorted out of the unit by staff with no issues.

## 2018-08-07 NOTE — Unmapped (Signed)
You have an appointment with your Nathaniel Maxwell therapist Nathaniel Maxwell on Monday 08/13/18 at 10:00 AM. You also have an appointment with the CNP Nathaniel Maxwell on Tuesday 08/14/18 at 2:00 PM. Both of these appointments are at the [facility]iel Maxwell office.  Contact your Nathaniel Maxwell case manager Nathaniel Maxwell at [phone] ext. [phone] for assistance with housing or any other needs you may have.    [facility] VP  [address] [place] [address]    Nathaniel Maxwell  If you need shelter, call the Nathaniel Maxwell [phone]) [phone]-SAFE [phone]). This is the [employer] (Nathaniel) a centralized Nathaniel system for families and individuals who are currently experiencing homelessness or who are at risk of becoming homeless. [facility]iel Maxwell screen callers based on their immediacy of need and make appropriate referrals into a partner agency???s Maxwell. Be prepared for a wait time. Unfortunately, due to the high need, [facility]iel [facility] shelters are frequently full.      Shelters Men   N[facility]iel Maxwell  [phone]    [address], [place], [place] [address]    [facility]iel [facility] - [address] House   [phone]    [facility] ([facility])     [phone]  [address] [place], [place] [address]               Nathaniel Maxwell                              [phone]                     [address]; [place], [place] [address]   Inexpensive Housing Options   [place]     [phone]   [facility]     [phone]     Should your symptoms return or worsen you can contact your [facility] case manager Keldan Eplin at [phone] ext. [phone]. You can also call 281-CARE or if you need an immediate evaluation you can go to [facility] ([facility]), which is located at [address], [place], [place] [address].

## 2018-08-07 NOTE — Progress Notes (Signed)
Occupational Therapy/Therapeutic Recreation Progress Note      Nathaniel Maxwell  [id number]      Group: Task  Attendance: Attended  60/60 minutes  Level of Participation: Active, Interacted with staff and Followed Directions    Pt readily attended group, appropriate ADLs and affect. Pt did not present with pressured speech this date. Pt requested to engage in beading activity, able to focus and complete organized beading task without assistance from staff. Pt transitioned to listen to music and was observed to search RVs on computer. Pt overall appropriate, cooperative and no management issues noted.     Donnal Debar, OTR/L  08/07/2018 11:49 AM

## 2018-08-14 ENCOUNTER — Ambulatory Visit: Admit: 2018-08-14 | Discharge: 2018-08-14 | Payer: Medicare (Managed Care)

## 2018-08-14 DIAGNOSIS — F309 Manic episode, unspecified: Secondary | ICD-10-CM

## 2018-08-14 NOTE — Unmapped (Signed)
History of Present Illness:   Nathaniel Maxwell is a 43 y.o. male    Pre visit planning has been done.     HPI   Hospital discharge for mania. bp difficult to control during stay. Had difficulty with bp control during hospital stay.   pt took amlodipine and doxazosin, but did not take hctz as does not think he needs it  Pt angry about medication, bp,some life in general  Histories:     He has a past medical history of Bipolar affective disorder (CMS Dx), HTN (hypertension), and Mycosis fungoides (CMS Dx).    He has a past surgical history that includes Knee arthroscopy w/ ACL reconstruction.    His family history includes Depression in his brother, maternal grandfather, and sister; Hyperlipidemia in his mother; Suicidality in his father.    He reports that he has never smoked. He has never used smokeless tobacco. He reports previous alcohol use. He reports previous drug use.      Review of Systems   Respiratory: Negative for shortness of breath.    Cardiovascular: Negative for chest pain and leg swelling.       Allergies:   Topamax [topiramate] and Zyprexa [olanzapine]    Medications:     Outpatient Encounter Medications as of 08/14/2018   Medication Sig Dispense Refill   ??? amLODIPine (NORVASC) 10 MG tablet Take 1 tablet (10 mg total) by mouth daily. Indications: hypertension 30 tablet 0   ??? benztropine (COGENTIN) 0.5 MG tablet Take 1 tablet (0.5 mg total) by mouth 2 times a day as needed (for stiffness). 60 tablet 0   ??? divalproex (DEPAKOTE) 250 MG DR tablet Take 3 tablets (750 mg total) by mouth every 12 hours for 30 days. 180 tablet 0   ??? hydroCHLOROthiazide (HYDRODIURIL) 25 MG tablet Take 1 tablet (25 mg total) by mouth daily. Indications: hypertension 30 tablet 0   ??? melatonin 3 mg Tab Take 5 tablets (15 mg total) by mouth at bedtime for 30 days. Indications: SLEEP 150 tablet 0   ??? metFORMIN (GLUCOPHAGE-XR) 500 MG 24 hr tablet Take 1 tablet (500 mg total) by mouth daily with breakfast. Indications: type 2 diabetes  mellitus 30 tablet 0   ??? QUEtiapine (SEROQUEL) 300 MG tablet Take 3 tablets (900 mg total) by mouth at bedtime for 30 days. 90 tablet 0   ??? terazosin (HYTRIN) 10 MG capsule Take 1 capsule (10 mg total) by mouth at bedtime. 30 capsule 0     No facility-administered encounter medications on file as of 08/14/2018.         Objective:         Vitals:    08/14/18 1422   BP: (!) 145/95   Pulse: 97   Temp: 97.9 ??F (36.6 ??C)   SpO2: 100%    Physical Exam  Vitals signs and nursing note reviewed.   HENT:      Head: Normocephalic and atraumatic.   Cardiovascular:      Rate and Rhythm: Normal rate and regular rhythm.      Heart sounds: Normal heart sounds.   Pulmonary:      Breath sounds: Normal breath sounds.   Musculoskeletal:      Right lower leg: No edema.      Left lower leg: No edema.   Neurological:      Mental Status: He is alert.   Psychiatric:      Comments: Pressured speech.       170/80-  Assessment:   Please see below     Plan:   htn-not well controlled on first check or second but pt agitated at 2nd check. Ask pt to take hctz for better control along with continue doxazosin and amlodipine         Worried about pts mental status as still with at the very least hypomania present during this visit, but to see psychiatry. Pt without homicidal ideation but does express some anger about doctors, shelter.     Encounter med-check valproate level,cbc,hepatic. Per pt ,mental health facility wants    Elevated CK-recheck ck

## 2018-08-18 ENCOUNTER — Emergency Department (HOSPITAL_COMMUNITY)
Admission: EM | Admit: 2018-08-18 | Discharge: 2018-08-18 | Disposition: A | Discharge status: Home / Self Care | Payer: Self-pay | Attending: Emergency Medicine | Admitting: Emergency Medicine

## 2018-08-18 ENCOUNTER — Encounter (HOSPITAL_COMMUNITY): Payer: Self-pay | Admitting: Emergency Medicine

## 2018-08-18 ENCOUNTER — Other Ambulatory Visit: Payer: Self-pay

## 2018-08-18 DIAGNOSIS — Z79899 Other long term (current) drug therapy: Secondary | ICD-10-CM | POA: Insufficient documentation

## 2018-08-18 DIAGNOSIS — F312 Bipolar disorder, current episode manic severe with psychotic features: Secondary | ICD-10-CM | POA: Insufficient documentation

## 2018-08-18 DIAGNOSIS — R45850 Homicidal ideations: Secondary | ICD-10-CM | POA: Insufficient documentation

## 2018-08-18 LAB — COMPREHENSIVE METABOLIC PANEL
ALT: 23 U/L (ref 0–44)
AST: 38 U/L (ref 15–41)
Albumin: 4.3 g/dL (ref 3.5–5.0)
Alkaline Phosphatase: 76 U/L (ref 38–126)
Anion gap: 11 (ref 5–15)
BUN: 15 mg/dL (ref 6–20)
CO2: 23 mmol/L (ref 22–32)
Calcium: 9.4 mg/dL (ref 8.9–10.3)
Chloride: 107 mmol/L (ref 98–111)
Creatinine, Ser: 1.02 mg/dL (ref 0.61–1.24)
GFR calc Af Amer: >60 mL/min (ref >60)
GFR calc non Af Amer: >60 mL/min (ref >60)
Glucose, Bld: 110 mg/dL — ABNORMAL HIGH (ref 70–99)
Potassium: 4 mmol/L (ref 3.5–5.1)
Sodium: 141 mmol/L (ref 135–145)
Total Bilirubin: 0.6 mg/dL (ref 0.3–1.2)
Total Protein: 8.2 g/dL — ABNORMAL HIGH (ref 6.5–8.1)

## 2018-08-18 LAB — RAPID URINE DRUG SCREEN, HOSP PERFORMED
Amphetamines: NOT DETECTED
Barbiturates: NOT DETECTED
Benzodiazepines: NOT DETECTED
Cocaine: NOT DETECTED
Opiates: NOT DETECTED
Tetrahydrocannabinol: NOT DETECTED

## 2018-08-18 LAB — CBC
HCT: 39.8 % (ref 39.0–52.0)
Hemoglobin: 12.9 g/dL — ABNORMAL LOW (ref 13.0–17.0)
MCH: 25.5 pg — ABNORMAL LOW (ref 26.0–34.0)
MCHC: 32.4 g/dL (ref 30.0–36.0)
MCV: 78.8 fL — ABNORMAL LOW (ref 80.0–100.0)
Platelets: 328 10*3/uL (ref 150–400)
RBC: 5.05 MIL/uL (ref 4.22–5.81)
RDW: 16.1 % — ABNORMAL HIGH (ref 11.5–15.5)
WBC: 7.7 10*3/uL (ref 4.0–10.5)
nRBC: 0 % (ref 0.0–0.2)

## 2018-08-18 LAB — SALICYLATE LEVEL: Salicylate Lvl: <7 mg/dL (ref 2.8–30.0)

## 2018-08-18 LAB — ACETAMINOPHEN LEVEL: Acetaminophen (Tylenol), Serum: <10 ug/mL — ABNORMAL LOW (ref 10–30)

## 2018-08-18 LAB — ETHANOL: Alcohol, Ethyl (B): <10 mg/dL (ref <10)

## 2018-08-18 MED ORDER — ZIPRASIDONE HCL 40 MG PO CAPS
40.0000 mg | ORAL_CAPSULE | Freq: Every day | ORAL | Status: DC
  Filled 2018-08-18: qty 1

## 2018-08-18 MED ORDER — ZIPRASIDONE MESYLATE 20 MG IM SOLR
20.0000 mg | Freq: Once | INTRAMUSCULAR | Status: AC
  Administered 2018-08-18: 14:00:00 20 mg via INTRAMUSCULAR
  Filled 2018-08-18: qty 20

## 2018-08-18 MED ORDER — ZIPRASIDONE HCL 40 MG PO CAPS
60.0000 mg | ORAL_CAPSULE | Freq: Every day | ORAL | Status: DC
  Filled 2018-08-18: qty 1

## 2018-08-18 MED ORDER — LAMOTRIGINE 25 MG PO TABS
25.0000 mg | ORAL_TABLET | Freq: Every day | ORAL | Status: DC
  Filled 2018-08-18: qty 1

## 2018-08-18 MED ORDER — AMLODIPINE BESYLATE 5 MG PO TABS: 10.0000 mg | ORAL_TABLET | Freq: Every day | ORAL | Status: DC

## 2018-08-18 MED ORDER — PRAVASTATIN SODIUM 40 MG PO TABS: 80.0000 mg | ORAL_TABLET | Freq: Every day | ORAL | Status: DC

## 2018-08-18 MED ORDER — DOXAZOSIN MESYLATE 8 MG PO TABS
8.0000 mg | ORAL_TABLET | Freq: Every day | ORAL | Status: DC
  Filled 2018-08-18: qty 1

## 2018-08-18 MED ORDER — LORAZEPAM 2 MG/ML IJ SOLN
2.0000 mg | Freq: Once | INTRAMUSCULAR | Status: AC
  Administered 2018-08-18: 16:00:00 2 mg via INTRAMUSCULAR
  Filled 2018-08-18: qty 1

## 2018-08-18 MED ORDER — TIZANIDINE HCL 4 MG PO TABS: 2.0000 mg | ORAL_TABLET | Freq: Four times a day (QID) | ORAL | Status: DC | PRN

## 2018-08-18 MED ORDER — TRAZODONE HCL 50 MG PO TABS: 100.0000 mg | ORAL_TABLET | Freq: Every day | ORAL | Status: DC

## 2018-08-18 MED ORDER — LAMOTRIGINE 100 MG PO TABS
100.0000 mg | ORAL_TABLET | Freq: Every day | ORAL | Status: DC
  Filled 2018-08-18: qty 1

## 2018-08-18 MED ORDER — FOLIC ACID 1 MG PO TABS: 1.0000 mg | ORAL_TABLET | Freq: Every day | ORAL | Status: DC

## 2018-08-18 MED ORDER — STERILE WATER FOR INJECTION IJ SOLN
INTRAMUSCULAR | Status: AC
  Administered 2018-08-18: 1.2 mL via INTRAMUSCULAR
  Filled 2018-08-18: qty 10

## 2018-08-18 MED ORDER — FERROUS SULFATE 75 (15 FE) MG/ML PO SOLN
15.0000 mg | Freq: Every day | ORAL | Status: DC
  Filled 2018-08-18: qty 1

## 2018-08-18 MED ORDER — MELOXICAM 7.5 MG PO TABS
15.0000 mg | ORAL_TABLET | Freq: Every day | ORAL | Status: DC
  Filled 2018-08-18: qty 2

## 2018-08-18 NOTE — BH Assessment (Signed)
Tele Assessment Note   Patient Name: Chris Rogers MRN: [id number] Referring Physician: Zenia Resides Location of Patient: [facility] ED Location of Provider: [place] is an 43 y.o. male presenting voluntarily to [facility] ED after being found by [employer] in his vehicle with manic behavior. Patient is a poor historian due to [place]. Patient presents hyper-verbal, disorganized, and with grandiose delusions. He struggled to complete assessment. Patient states that he currently lives in [place] and was hospitalized at [facility] 1 year ago. He states in the past year he has "been on the world tour of mental hospitals." He reports a prior diagnosis of bipolar disorder but states that he diagnosed himself recently with multiple personalities. He reports having 3 different personalities. Patient denies SI/HI/AVH. He states that he has a psychiatrist in [place] but is not taking medications. 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" and has numerous upcoming court dates for legal cases. He also states that he will sue our psychiatrist for malpractice but will "wave his consulting fee." Patient states that he has not slept in 4 days and does not recall the last time he ate. This clinician was unable to obtain an accurate history regarding substance use or criminal charges. Patient appears to be preoccupied with religion as his personalities are "biblical."   Patient gave consent for TTS to contact his mother Clarice [phone]) for collateral if necessary.  Diagnosis: F31.2 Bipolar I disorder, current episode manic, with psychotic features  Past Medical History: History reviewed. No pertinent past medical history.  History reviewed. No pertinent surgical history.  Family History: History reviewed. No pertinent family history.  Social History:  reports that he has never smoked. He has never used smokeless tobacco. He reports previous alcohol use. He reports previous drug  use.  Additional Social History:  Alcohol / Drug Use Pain Medications: see MAR Prescriptions: see MAR Over the Counter: see MAR History of alcohol / drug use?: No history of alcohol / drug abuse  CIWA: CIWA-Ar BP: (!) 163/99 Pulse Rate: (!) 105 COWS:    Allergies:  Allergies  Allergen Reactions  . Invega [Paliperidone] Other (See Comments)    "manic episode"  . Topamax [Topiramate] Other (See Comments)    "blindness"    Home Medications: (Not in a hospital admission)   OB/GYN Status:  No LMP for male patient.  General Assessment Data Location of Assessment: [facility] ED TTS Assessment: In system Is this a Tele or Face-to-Face Assessment?: Tele Assessment Is this an Initial Assessment or a Re-assessment for this encounter?: Initial Assessment Patient Accompanied by:: N/A Language Other than English: No Living Arrangements: Homeless/Shelter What gender do you identify as?: Male Marital status: Married [place] name: Talley Pregnancy Status: No Living Arrangements: ("in flux") Can pt return to current living arrangement?: Yes Admission Status: Voluntary Is patient capable of signing voluntary admission?: No Referral Source: Self/Family/Friend Insurance type: None     Crisis Care Plan Living Arrangements: ("in flux") Legal Guardian: (self) Name of Psychiatrist: in [place] Name of Therapist: none  Education Status Is patient currently in school?: No Is the patient employed, unemployed or receiving disability?: Unemployed  Risk to self with the past 6 months Suicidal Ideation: No Has patient been a risk to self within the past 6 months prior to admission? : No Suicidal Intent: No Has patient had any suicidal intent within the past 6 months prior to admission? : No Is patient at risk for suicide?: No Suicidal Plan?: No Has patient  had any suicidal plan within the past 6 months prior to admission? : No Access to Means: No What has been your use of drugs/alcohol  within the last 12 months?: UTA Previous Attempts/Gestures: No How many times?: 0 Other Self Harm Risks: none noted Triggers for Past Attempts: None known Intentional Self Injurious Behavior: None Family Suicide History: No Recent stressful life event(s): Divorce Persecutory voices/beliefs?: No Depression: No Depression Symptoms: Feeling angry/irritable Substance abuse history and/or treatment for substance abuse?: No Suicide prevention information given to non-admitted patients: Not applicable  Risk to Others within the past 6 months Homicidal Ideation: Yes-Currently Present Does patient have any lifetime risk of violence toward others beyond the six months prior to admission? : (per chart reviw) Thoughts of Harm to Others: Yes-Currently Present Comment - Thoughts of Harm to Others: vague- toward president Current Homicidal Intent: No Current Homicidal Plan: No Access to Homicidal Means: No Identified Victim: none History of harm to others?: No Assessment of Violence: None Noted Violent Behavior Description: none Does patient have access to weapons?: No(UTA) Criminal Charges Pending?: No(UTA) Does patient have a court date: (UTA) Is patient on probation?: Unknown  Psychosis Hallucinations: None noted Delusions: Grandiose, Unspecified  Mental Status Report Appearance/Hygiene: In scrubs Eye Contact: Good Motor Activity: Freedom of movement Speech: Rapid, Pressured Level of Consciousness: Restless Mood: Pleasant, Euphoric Affect: Appropriate to circumstance Anxiety Level: Minimal Thought Processes: Circumstantial Judgement: Impaired Orientation: Person, Place, Time, Situation Obsessive Compulsive Thoughts/Behaviors: None  Cognitive Functioning Concentration: Poor Memory: Unable to Assess Is patient IDD: No Insight: Poor Impulse Control: Poor Appetite: Poor Have you had any weight changes? : No Change Sleep: Decreased Total Hours of Sleep: 0 Vegetative  Symptoms: None  ADLScreening [facility] Assessment Services) Patient's cognitive ability adequate to safely complete daily activities?: Yes Patient able to express need for assistance with ADLs?: Yes Independently performs ADLs?: Yes (appropriate for developmental age)  Prior Inpatient Therapy Prior Inpatient Therapy: Yes Prior Therapy Dates: 2019 [facility] Reason for Treatment: bipolar  Prior Outpatient Therapy Prior Outpatient Therapy: Yes Prior Therapy Dates: ongoing Prior Therapy Facilty/Provider(s): [facility] Reason for Treatment: bipolar Does patient have an ACCT team?: No Does patient have Intensive In-House Services?  : No Does patient have [facility] services? : No Does patient have P4CC services?: No  ADL Screening (condition at time of admission) Patient's cognitive ability adequate to safely complete daily activities?: Yes Is the patient deaf or have difficulty hearing?: No Does the patient have difficulty seeing, even when wearing glasses/contacts?: No Does the patient have difficulty concentrating, remembering, or making decisions?: No Patient able to express need for assistance with ADLs?: Yes Does the patient have difficulty dressing or bathing?: No Independently performs ADLs?: Yes (appropriate for developmental age) Does the patient have difficulty walking or climbing stairs?: No Weakness of Legs: None Weakness of Arms/Hands: None  Home Assistive Devices/Equipment Home Assistive Devices/Equipment: None  Therapy Consults (therapy consults require a physician order) PT Evaluation Needed: No OT Evalulation Needed: No SLP Evaluation Needed: No Abuse/Neglect Assessment (Assessment to be complete while patient is alone) Abuse/Neglect Assessment Can Be Completed: Unable to assess, patient is non-responsive or altered mental status Values / Beliefs Cultural Requests During Hospitalization: None Spiritual Requests During Hospitalization:  None Consults Spiritual Care Consult Needed: No Social Work Consult Needed: No Merchant navy officerAdvance Directives (For Healthcare) Does Patient Have a Medical Advance Directive?: No Would patient like information on creating a medical advance directive?: No - Patient declined  Disposition: Fransisca KaufmannLaura Davis, PMHNP recommends in patient.  Disposition Initial Assessment Completed for this Encounter: Yes  This service was provided via telemedicine using a 2-way, interactive audio and video technology.  Names of all persons participating in this telemedicine service and their role in this encounter. Name: Everardo BealsBennie Pinson Role: patient  Name: Celedonio MiyamotoMeredith Lana Flaim, LCSW Role: TTS  Name:  Role:   Name:  Role:     Celedonio MiyamotoMeredith  Nisaiah Bechtol 08/18/2018 12:33 PM

## 2018-08-18 NOTE — ED Notes (Signed)
Pt will not stay in room. Constantly coming to desk and other rooms to ask questions. Pt has been instructed to stay in room.

## 2018-08-18 NOTE — ED Triage Notes (Signed)
Pt. Stated, Im homicidal I feel like I want to kill people, My "God has told me that.

## 2018-08-18 NOTE — ED Triage Notes (Signed)
Pt. Is here voluntary he is Bi-polar and personality.  Pt. Stated. I need long term care for B-Polar.  Pt constantly talking and using foul language.

## 2018-08-18 NOTE — ED Provider Notes (Addendum)
[facility] Provider Note   CSN: [id number] Arrival date & time: 08/18/18  0909     History   Chief Complaint Chief Complaint  Patient presents with  . Altered Mental Status  . Homicidal    HPI Everardo BealsBennie Gallo is a 43 y.o. male.     43 year old male with history of bipolar disorder as well as personality disorder who presents with homicidal ideations as well as racing thoughts.  Is unsure if he is been compliant with his medications.  Denies any suicidal thoughts.  States that 1 of his personalities became homicidal but not at present is better.  Feels as if that he was dehydrated and states that since he has been with a drink liquids he does feel better.  Denies any use of alcohol or illicit drugs at this time.  Is unsure if he is responding to internal stimuli.  No treatment used prior to arrival     No past medical history on file.  Patient Active Problem List   Diagnosis Date Noted  . Affective psychosis, bipolar (HCC)           Home Medications    Prior to Admission medications   Medication Sig Start Date End Date Taking? Authorizing Provider  amLODipine (NORVASC) 10 MG tablet Take 10 mg by mouth daily.    [provider]  clobetasol ointment (TEMOVATE) 0.05 % Apply 1 application topically 2 (two) times daily.    [provider]  doxazosin (CARDURA) 8 MG tablet Take 8 mg by mouth daily.    [provider]  ferrous sulfate (FER-IN-SOL) 75 (15 Fe) MG/ML SOLN Take 15 mg of iron by mouth daily.    [provider]  folic acid (FOLVITE) 1 MG tablet Take 1 mg by mouth daily.    [provider]  GLUCOSAMINE-CHONDROITIN PO Take 1 tablet by mouth daily.     [provider]  lamoTRIgine (LAMICTAL) 100 MG tablet Take 100 mg by mouth daily.    [provider]  lamoTRIgine (LAMICTAL) 25 MG tablet Take 25 mg by mouth daily. Take daily with 100 mg    [provider]   meloxicam (MOBIC) 15 MG tablet Take 15 mg by mouth daily.    [provider]  Multiple Vitamins-Minerals (MULTIVITAMIN ADULT PO) Take by mouth.    [provider]  pravastatin (PRAVACHOL) 80 MG tablet Take 80 mg by mouth daily.    [provider]  PRESCRIPTION MEDICATION Urinozinc Prostate Formula 100mg     [provider]  tiZANidine (ZANAFLEX) 2 MG tablet Take by mouth every 6 (six) hours as needed for muscle spasms.    [provider]  traZODone (DESYREL) 100 MG tablet Take 100 mg by mouth at bedtime.    [provider]  triamcinolone ointment (KENALOG) 0.1 % Apply 1 application topically 2 (two) times daily.    [provider]  ziprasidone (GEODON) 40 MG capsule Take 40 mg by mouth every morning.    [provider]  ziprasidone (GEODON) 60 MG capsule Take 60 mg by mouth at bedtime. 08/10/17   [provider]    Family History No family history on file.  Social History Social History   Tobacco Use  . Smoking status: Never Smoker  . Smokeless tobacco: Never Used  Substance Use Topics  . Alcohol use: Not Currently  . Drug use: Not Currently     Allergies   Invega [paliperidone] and Topamax [topiramate]  Review of Systems Review of Systems  Unable to perform ROS: Psychiatric disorder     Physical Exam Updated Vital Signs BP (!) 163/99 (BP Location: Right Arm)   Pulse (!) 105   Temp 97.7 F (36.5 C)   Resp 17   SpO2 100%   Physical Exam Vitals signs and nursing note reviewed.  Constitutional:      General: He is not in acute distress.    Appearance: Normal appearance. He is well-developed. He is not toxic-appearing.  HENT:     Head: Normocephalic and atraumatic.  Eyes:     General: Lids are normal.     Conjunctiva/sclera: Conjunctivae normal.     Pupils: Pupils are equal, round, and reactive to light.  Neck:     Musculoskeletal: Normal range of motion and neck supple.     Thyroid:  No thyroid mass.     Trachea: No tracheal deviation.  Cardiovascular:     Rate and Rhythm: Normal rate and regular rhythm.     Heart sounds: Normal heart sounds. No murmur. No gallop.   Pulmonary:     Effort: Pulmonary effort is normal. No respiratory distress.     Breath sounds: Normal breath sounds. No stridor. No decreased breath sounds, wheezing, rhonchi or rales.  Abdominal:     General: Bowel sounds are normal. There is no distension.     Palpations: Abdomen is soft.     Tenderness: There is no abdominal tenderness. There is no rebound.  Musculoskeletal: Normal range of motion.        General: No tenderness.  Skin:    General: Skin is warm and dry.     Findings: No abrasion or rash.  Neurological:     Mental Status: He is alert and oriented to person, place, and time.     GCS: GCS eye subscore is 4. GCS verbal subscore is 5. GCS motor subscore is 6.     Cranial Nerves: No cranial nerve deficit.     Sensory: No sensory deficit.  Psychiatric:        Attention and Perception: Attention normal.        Mood and Affect: Mood is anxious. Affect is labile.        Speech: Speech is rapid and pressured.        Behavior: Behavior is hyperactive.        Thought Content: Thought content is delusional. Thought content does not include suicidal ideation.        Judgment: Judgment is inappropriate.      ED Treatments / Results  Labs (all labs ordered are listed, but only abnormal results are displayed) Labs Reviewed  COMPREHENSIVE METABOLIC PANEL - Abnormal; Notable for the following components:      Result Value   Glucose, Bld 110 (*)    Total Protein 8.2 (*)    All other components within normal limits  ACETAMINOPHEN LEVEL - Abnormal; Notable for the following components:   Acetaminophen (Tylenol), Serum <10 (*)    All other components within normal limits  CBC - Abnormal; Notable for the following components:   Hemoglobin 12.9 (*)    MCV 78.8 (*)    MCH 25.5 (*)    RDW 16.1  (*)    All other components within normal limits  ETHANOL  SALICYLATE LEVEL  RAPID URINE DRUG SCREEN, HOSP PERFORMED    EKG None  Radiology No results found.  Procedures Procedures (including critical care time)  Medications Ordered in ED Medications -  No data to display   Initial Impression / Assessment and Plan / ED Course  I have reviewed the triage vital signs and the nursing notes.  Pertinent labs & imaging results that were available during my care of the patient were reviewed by me and considered in my medical decision making (see chart for details).        Patient's labs are reviewed here and are reassuring.  Patient appears to be having a manic episode of his bipolar disorder.  Also has multiple personalities some of which are telling him to do harmful things.  He is alert and oriented x3 but appears to be responding to internal stimuli.  Will consult psychiatry for likely admission  2:13 PM Patient has become more combative and has required Geodon as well as to be placed under IVC for his safety.  CRITICAL CARE Performed by: Leota Jacobsen Total critical care time: 50 minutes Critical care time was exclusive of separately billable procedures and treating other patients. Critical care was necessary to treat or prevent imminent or life-threatening deterioration. Critical care was time spent personally by me on the following activities: development of treatment plan with patient and/or surrogate as well as nursing, discussions with consultants, evaluation of patient's response to treatment, examination of patient, obtaining history from patient or surrogate, ordering and performing treatments and interventions, ordering and review of laboratory studies, ordering and review of radiographic studies, pulse oximetry and re-evaluation of patient's condition.  Final Clinical Impressions(s) / ED Diagnoses   Final diagnoses:  None    ED Discharge Orders    None        Lacretia Leigh, MD 08/18/18 1128    Lacretia Leigh, MD 08/18/18 1413

## 2018-08-18 NOTE — ED Notes (Signed)
Staffing called about sitter. No sitters are available at this time

## 2018-08-18 NOTE — ED Notes (Signed)
[facility] sheriff here to transfer pt to [facility]. Paperwork given to [employer]

## 2018-08-18 NOTE — ED Notes (Signed)
Pt. Changed clothes into scrubs. Security called for wanding.

## 2018-08-18 NOTE — [facility] Counselor (Addendum)
TTS attempting to initiate tele-psych. Spoke with Lattie Haw, RN who is attempting to locate cart.

## 2018-08-18 NOTE — Progress Notes (Signed)
Patient meets criteria for inpatient treatment. No appropriate or available beds at [facility]. CSW faxed referrals to the following facilities for review:  [facility]  TTS will continue to seek bed placement.  Chalmers Guest. Guerry Bruin, MSW, [place] Work/Disposition Phone: [phone] Fax: [phone]

## 2018-08-18 NOTE — ED Notes (Signed)
Pt has been accepted to [facility]. Left message for [facility] Deputy notifying of need for transport.

## 2018-08-18 NOTE — ED Notes (Signed)
Pt has been wanded by security. 

## 2018-08-18 NOTE — Progress Notes (Addendum)
Patient has been accepted at [facility] in the [place] A unit. Accepting provider is Dr. Reece Levy, MD. Number to call report is [phone]. Bed is available this evening. If patient is IVC'd paperwork will need to be sent to them.   Chris Rogers. Chris Rogers, MSW, [place] Work/Disposition Phone: [phone] Fax: [phone]

## 2018-08-30 ENCOUNTER — Ambulatory Visit: Payer: Medicare (Managed Care)

## 2018-09-11 NOTE — Unmapped (Signed)
Pt's insurance calling to see if PCP would be willing to start pt on a statin;     He is requesting to speak with PCP regarding adding new medication     Please advise     No future appointments.

## 2018-09-26 NOTE — Telephone Encounter (Signed)
Pt is currently is in/out of town and not in a place to add medication at this time

## 2018-10-29 MED ORDER — doxazosin (CARDURA) 8 MG tablet
8 | ORAL_TABLET | Freq: Every evening | ORAL | 6 refills | Status: AC
Start: 2018-10-29 — End: 2019-07-17

## 2018-10-29 MED ORDER — hydroCHLOROthiazide (HYDRODIURIL) 25 MG tablet
25 | ORAL_TABLET | Freq: Every day | ORAL | 6 refills | 90.00000 days | Status: AC
Start: 2018-10-29 — End: 2019-07-17

## 2018-10-29 MED ORDER — amLODIPine (NORVASC) 10 MG tablet
10 | ORAL_TABLET | Freq: Every day | ORAL | 6 refills | Status: AC
Start: 2018-10-29 — End: 2019-07-17

## 2018-10-29 NOTE — Unmapped (Signed)
Last visit 08/14/2018 Candie Mile, MD  Future Appointments   Date Time Provider Department Center   12/06/2018  9:00 AM Candie Mile, MD [facility]     Requested Prescriptions     Pending Prescriptions Disp Refills   ??? doxazosin (CARDURA) 8 MG tablet 30 tablet      Sig: Take 1 tablet (8 mg total) by mouth at bedtime.   ??? amLODIPine (NORVASC) 10 MG tablet 30 tablet 0     Sig: Take 1 tablet (10 mg total) by mouth daily. Indications: hypertension   ??? hydroCHLOROthiazide (HYDRODIURIL) 25 MG tablet 30 tablet 0     Sig: Take 1 tablet (25 mg total) by mouth daily. Indications: hypertension       No flowsheet data found.     Allergies and pharmacy verified.     Forwarding to provider for approval or denial

## 2018-10-29 NOTE — Unmapped (Signed)
Pt called requesting refills on medications     Pt has a weeks worth of medications left     States he hasn't been taking HCTZ; BP has been running 130's-140's/80's-90's    Please advise     Future Appointments   Date Time Provider Department Center   12/06/2018  9:00 AM Candie Mile, MD [facility]

## 2018-11-05 NOTE — Telephone Encounter (Signed)
Patient calling, would like to get a referral sent to [facility], Dr. Genevieve Norlander Phone [phone], does not have fax number. Was supposed to have seen derm last year but never scheduled, referral is now expired, has seen Derm in the past and got light treatments.

## 2018-11-06 NOTE — Unmapped (Signed)
Addended by: Candie Mile on: 11/06/2018 01:10 PM     Modules accepted: Orders

## 2018-11-06 NOTE — Telephone Encounter (Signed)
Referral done

## 2018-11-07 NOTE — Telephone Encounter (Signed)
Faxed dermatology referral to [facility] fax number [phone]. Nathaniel Maxwell

## 2018-12-06 ENCOUNTER — Ambulatory Visit: Admit: 2018-12-06 | Discharge: 2018-12-06 | Payer: Medicare (Managed Care)

## 2018-12-06 ENCOUNTER — Ambulatory Visit: Admit: 2018-12-06 | Payer: Medicare (Managed Care)

## 2018-12-06 DIAGNOSIS — E119 Type 2 diabetes mellitus without complications: Secondary | ICD-10-CM

## 2018-12-06 LAB — MICROALBUMIN (RANDOM UR)
Creatinine, Urine: 117.4 mg/dL
Microalb / UCreat: 22.4 mg/G (ref 0.0–30.0)
Microalb, Ur: 26.3 mg/L (ref 0.0–17.0)

## 2018-12-06 LAB — POC HEMOGLOBIN A1C: POC Hemoglobin A1C: 6.4 % (ref 4.0–5.6)

## 2018-12-06 NOTE — Unmapped (Signed)
History of Present Illness:   Nathaniel Maxwell is a 43 y.o. male    Pre visit planning has been done.     HPI   Diabetes,htn  Eating more in general eating more carbs, decreased activity      htn-The blood pressure is under decent control. I will continue to follow and there will be no change in the medicines.     tapering down on Seroquel. Pt on [name] injection. No hospital stay for 3 months.  Does see therapist and psychiatrist  Histories:     He has a past medical history of Bipolar affective disorder (CMS Dx), HTN (hypertension), and Mycosis fungoides (CMS Dx).    He has a past surgical history that includes Knee arthroscopy w/ ACL reconstruction.    His family history includes Depression in his brother, maternal grandfather, and sister; Hyperlipidemia in his mother; Suicidality in his father.    He reports that he has never smoked. He has never used smokeless tobacco. He reports previous alcohol use. He reports previous drug use.      Review of Systems   Constitutional: Positive for weight gain. Negative for weight loss.   Respiratory: Negative for shortness of breath.    Cardiovascular: Negative for chest pain.       Allergies:   Topamax [topiramate] and Zyprexa [olanzapine]    Medications:     Outpatient Encounter Medications as of 12/06/2018   Medication Sig Dispense Refill   ??? amLODIPine (NORVASC) 10 MG tablet Take 1 tablet (10 mg total) by mouth daily. Indications: hypertension 30 tablet 6   ??? benztropine (COGENTIN) 0.5 MG tablet Take 1 tablet (0.5 mg total) by mouth 2 times a day as needed (for stiffness). 60 tablet 0   ??? doxazosin (CARDURA) 8 MG tablet Take 1 tablet (8 mg total) by mouth at bedtime. 30 tablet 6   ??? hydroCHLOROthiazide (HYDRODIURIL) 25 MG tablet Take 1 tablet (25 mg total) by mouth daily. Indications: hypertension 30 tablet 6   ??? loratadine (CLARITIN) 10 mg tablet Take 10 mg by mouth daily.     ??? metFORMIN (GLUCOPHAGE-XR) 500 MG 24 hr tablet Take 1 tablet (500 mg total) by mouth daily with  breakfast. Indications: type 2 diabetes mellitus 30 tablet 0     No facility-administered encounter medications on file as of 12/06/2018.         Objective:         Vitals:    12/06/18 0826   BP: 134/78   Pulse: (!) 30   Temp: 97.2 ??F (36.2 ??C)   SpO2: 100%    Physical Exam  Vitals signs and nursing note reviewed.   HENT:      Head: Normocephalic and atraumatic.   Neck:      Musculoskeletal: Normal range of motion and neck supple.      Thyroid: No thyromegaly.      Vascular: No carotid bruit.   Cardiovascular:      Rate and Rhythm: Normal rate and regular rhythm.      Heart sounds: Normal heart sounds.   Pulmonary:      Effort: Pulmonary effort is normal. No respiratory distress.      Breath sounds: Normal breath sounds. No wheezing or rales.   Musculoskeletal:      Right lower leg: No edema.      Left lower leg: No edema.   Skin:     General: Skin is warm.   Neurological:      Mental Status:  He is alert.           Foot Exam:  Skeletal:  bunion  Pedal Pulses:  dorsalis pedis pulses 2+ symmetric and posterior tibial pulses 2+ symmetric  Peripheral Circulation:  no cyanosis, clubbing or edema; well perfused, no ulceration  Integument:  very dry skin  Monofilament: Left foot intact, Right foot intact      Assessment:      Please see below   Plan:        Diabetes mellitus-continue metformin  Needs eye exam--insurer can help.  Foot exam      htn--The blood pressure is under decent control. I will continue to follow and there will be no change in the medicines.

## 2018-12-12 NOTE — Progress Notes (Signed)
[facility] report placed on MD desk for viewing. Nathaniel Maxwell

## 2018-12-13 ENCOUNTER — Emergency Department (HOSPITAL_COMMUNITY)
Admission: EM | Admit: 2018-12-13 | Discharge: 2018-12-14 | Discharge status: Against Medical Advice | Payer: Medicare HMO | Attending: Emergency Medicine | Admitting: Emergency Medicine

## 2018-12-13 ENCOUNTER — Other Ambulatory Visit: Payer: Self-pay

## 2018-12-13 ENCOUNTER — Encounter (HOSPITAL_COMMUNITY): Payer: Self-pay

## 2018-12-13 DIAGNOSIS — Z5321 Procedure and treatment not carried out due to patient leaving prior to being seen by health care provider: Secondary | ICD-10-CM | POA: Diagnosis not present

## 2018-12-13 DIAGNOSIS — J029 Acute pharyngitis, unspecified: Secondary | ICD-10-CM | POA: Diagnosis present

## 2018-12-13 NOTE — ED Triage Notes (Signed)
Pt reports a sore throat that started today. He states that he drank after someone and thinks that he "got the cooties." A&Ox4. Pt is hypertensive, with a hx of same. He states that he does not take his [place] because "it makes him pee too much."

## 2019-01-02 NOTE — Telephone Encounter (Signed)
Received a call from Morrie Sheldon, a pharmacist with [facility], regarding a statin medication. She stated that patient was previously on Pravastatin but not since May 2019 and she is worried that he is at risk for cardiovascular events. She is wanting to know if patient should be on a statin medication or not. She would like one ordered for him and then getting this re-evaluated at his next appointment if PCP thinks this is appropriate for patient.       Looking through patients past and present medication history, I am not seeing this medication at all in his chart. She is unable to see who prescribed this in the first place.     She stated that pravastatin was the only statin that was tried by this patient, and she is recommending a different statin for this patient to see if he can tolerate it. She would like for patient to try a statin for at least a month to see how he does, and draw labs at the next visit.     This medication must of been prescribed by a different doctor due to patient not coming here until 11/13/2017 as a new patient.     She stated that she will send a fax over to our office as well regarding all of this information.     Morrie Sheldon stated that if PCP has any questions she can call [phone] and speak with another pharmacist regarding this information as well.

## 2019-02-11 NOTE — Unmapped (Signed)
FYI - no further action needed      Reason for Disposition  ??? Information only question and nurse able to answer    Answer Assessment - Initial Assessment Questions  1. REASON FOR CALL or QUESTION: What is your reason for calling today? or How can I best help you? or What question do you have that I can help answer?      Patient calling in endorsing that he needs proof of a chest xray for his landlord. Patient reports that he has a positive PPD in the past. Patient denies recent PPD test. Discussed this with patient and advised that he should be able to see this through his mychart. Patient advised to take a look through his mychart for cxr in 07/2018. Advised patient if this does not work to call and speak with medical records at [phone]    Protocols used: INFORMATION ONLY CALL - NO TRIAGE-A-[place]    Attention Provider: Thank you for allowing me to participate in the care of your patient. The patient was connected to triage in response to information provided to the central scheduling team at [facility]

## 2019-02-13 NOTE — Telephone Encounter (Signed)
He had a cxr in July, does he need a closer ne. If so I have placed an order

## 2019-02-13 NOTE — Telephone Encounter (Signed)
Pt called regarding information above. Stating he needs chest x-ray for his housing.

## 2019-02-14 ENCOUNTER — Inpatient Hospital Stay: Admit: 2019-02-14 | Payer: Medicare (Managed Care)

## 2019-02-14 DIAGNOSIS — R7611 Nonspecific reaction to tuberculin skin test without active tuberculosis: Secondary | ICD-10-CM

## 2019-02-20 ENCOUNTER — Ambulatory Visit: Payer: Medicare (Managed Care)

## 2019-02-20 NOTE — Unmapped (Deleted)
History of Present Illness:   Nathaniel Maxwell is a 44 y.o. male    Pre visit planning has been done.     HPI          Histories:     He has a past medical history of Bipolar affective disorder (CMS Dx), HTN (hypertension), and Mycosis fungoides (CMS Dx).    He has a past surgical history that includes Knee arthroscopy w/ ACL reconstruction.    His family history includes Depression in his brother, maternal grandfather, and sister; Hyperlipidemia in his mother; Suicidality in his father.    He reports that he has never smoked. He has never used smokeless tobacco. He reports previous alcohol use. He reports previous drug use.      Review of Systems    Allergies:   Topamax [topiramate] and Zyprexa [olanzapine]    Medications:     Outpatient Encounter Medications as of 02/20/2019   Medication Sig Dispense Refill   ??? amLODIPine (NORVASC) 10 MG tablet Take 1 tablet (10 mg total) by mouth daily. Indications: hypertension 30 tablet 6   ??? benztropine (COGENTIN) 0.5 MG tablet Take 1 tablet (0.5 mg total) by mouth 2 times a day as needed (for stiffness). 60 tablet 0   ??? doxazosin (CARDURA) 8 MG tablet Take 1 tablet (8 mg total) by mouth at bedtime. 30 tablet 6   ??? hydroCHLOROthiazide (HYDRODIURIL) 25 MG tablet Take 1 tablet (25 mg total) by mouth daily. Indications: hypertension 30 tablet 6   ??? lamoTRIgine (LAMICTAL) 200 MG tablet      ??? loratadine (CLARITIN) 10 mg tablet Take 10 mg by mouth daily.     ??? metFORMIN (GLUCOPHAGE-XR) 500 MG 24 hr tablet Take 1 tablet (500 mg total) by mouth daily with breakfast. Indications: type 2 diabetes mellitus 30 tablet 0   ??? simvastatin (ZOCOR) 40 MG tablet        No facility-administered encounter medications on file as of 02/20/2019.         Objective:         Physical Exam       Assessment:      Please see below   Plan:

## 2019-02-21 ENCOUNTER — Ambulatory Visit: Admit: 2019-02-21 | Discharge: 2019-02-25 | Payer: Medicare (Managed Care)

## 2019-02-21 DIAGNOSIS — Z0000 Encounter for general adult medical examination without abnormal findings: Secondary | ICD-10-CM

## 2019-02-21 NOTE — Patient Instructions (Addendum)
You need an eye exam this year

## 2019-02-21 NOTE — Unmapped (Signed)
History of Present Illness:   Nathaniel Maxwell is a 44 y.o. male    Pre visit planning has been done.     HPI   Needs physical for group home  Living in group home but states plans to move to apartment  Goal weight that patient chooses is 190lbs  Eats a lot of veggies,cheese, but reducing bread     Health Maintenance Due   Topic Date Due   ??? Hepatitis C Screening (MyChart)  April 18, 1973   ??? Depression Monitoring (PHQ-9)  12-25-1973   ??? Annual Medicare Wellness Visit  1973-10-28   ??? Comprehensive Physical Exam  05-28-1973   ??? HIV Screening  03/09/1991   ??? Immunization: Hepatitis B (1 of 3 - Risk 3-dose series) 03/08/1992   ??? Diabetic Eye Exam (MyChart)  03/07/2018     Depression: none  Hearing loss: none   Physical complaints/nutrition: see ros  ADL's and support system: living at group home  Driving: drives own car.    Healthy habits: exercises daily--cardio,calesthenics   Taking medications:  no problems  Fall risk:  none  Mini-cog:sees psychiatrist  Advanced directives:Sharice Wood, sister    Histories:     He has a past medical history of Bipolar affective disorder (CMS Dx), HTN (hypertension), and Mycosis fungoides (CMS Dx).    He has a past surgical history that includes Knee arthroscopy w/ ACL reconstruction.    His family history includes Depression in his brother, maternal grandfather, and sister; Hyperlipidemia in his mother; Suicidality in his father.    He reports that he has never smoked. He has never used smokeless tobacco. He reports previous alcohol use. He reports previous drug use.      Review of Systems   Constitutional: Positive for weight loss. Negative for weight gain.   Respiratory: Negative for shortness of breath.    Cardiovascular: Negative for chest pain.   Gastrointestinal: Negative for constipation and diarrhea.   Musculoskeletal: Positive for neck pain (cramping). Negative for arthralgias.   Neurological: Negative for dizziness and headaches.       Allergies:   Topamax [topiramate] and  Zyprexa [olanzapine]    Medications:     Outpatient Encounter Medications as of 02/21/2019   Medication Sig Dispense Refill   ??? amLODIPine (NORVASC) 10 MG tablet Take 1 tablet (10 mg total) by mouth daily. Indications: hypertension 30 tablet 6   ??? benztropine (COGENTIN) 0.5 MG tablet Take 1 tablet (0.5 mg total) by mouth 2 times a day as needed (for stiffness). 60 tablet 0   ??? doxazosin (CARDURA) 8 MG tablet Take 1 tablet (8 mg total) by mouth at bedtime. 30 tablet 6   ??? hydroCHLOROthiazide (HYDRODIURIL) 25 MG tablet Take 1 tablet (25 mg total) by mouth daily. Indications: hypertension 30 tablet 6   ??? lamoTRIgine (LAMICTAL) 200 MG tablet      ??? loratadine (CLARITIN) 10 mg tablet Take 10 mg by mouth daily.     ??? metFORMIN (GLUCOPHAGE-XR) 500 MG 24 hr tablet Take 1 tablet (500 mg total) by mouth daily with breakfast. Indications: type 2 diabetes mellitus 30 tablet 0   ??? simvastatin (ZOCOR) 40 MG tablet        No facility-administered encounter medications on file as of 02/21/2019.         Objective:         Vitals:    02/21/19 1040   BP: 140/84   Pulse: 103   Temp: 97.5 ??F (36.4 ??C)   SpO2: 98%  BP: 121/77     Physical Exam  Vitals signs and nursing note reviewed.   Constitutional:       Appearance: He is well-developed.   HENT:      Head: Normocephalic and atraumatic.      Right Ear: Tympanic membrane, ear canal and external ear normal.      Left Ear: Tympanic membrane, ear canal and external ear normal.   Eyes:      Conjunctiva/sclera: Conjunctivae normal.      Pupils: Pupils are equal, round, and reactive to light.   Neck:      Musculoskeletal: Normal range of motion and neck supple.      Thyroid: No thyromegaly.      Trachea: No tracheal deviation.   Cardiovascular:      Rate and Rhythm: Normal rate and regular rhythm.      Heart sounds: Normal heart sounds.   Pulmonary:      Effort: Pulmonary effort is normal. No respiratory distress.      Breath sounds: Normal breath sounds. No wheezing or rales.   Abdominal:       General: Bowel sounds are normal. There is no distension.      Palpations: Abdomen is soft.      Tenderness: There is no abdominal tenderness. There is no guarding or rebound.   Musculoskeletal:         General: No tenderness.      Right lower leg: No edema.      Left lower leg: No edema.   Lymphadenopathy:      Cervical: No cervical adenopathy.   Skin:     General: Skin is warm and dry.      Findings: Rash (hypopigmented patches across skin) present.   Neurological:      Mental Status: He is alert and oriented to person, place, and time.   Psychiatric:         Behavior: Behavior normal.         Thought Content: Thought content normal.            Assessment:    Please see below     Plan:   Medicare annual wellness/exam-  cxr for tb screening.     Diabetes--needs eye exam.well controlled bs   discussed lifestyle changes and safe timing to lose weight    BAD--denies suicidality. Pt on meds and currently not manic although some grandiose statements

## 2019-04-08 ENCOUNTER — Ambulatory Visit: Payer: Medicare (Managed Care)

## 2019-06-22 ENCOUNTER — Other Ambulatory Visit: Payer: Self-pay

## 2019-06-22 ENCOUNTER — Ambulatory Visit (HOSPITAL_COMMUNITY)
Admission: EM | Admit: 2019-06-22 | Discharge: 2019-06-23 | Disposition: A | Discharge status: Home / Self Care | Payer: Medicare HMO | Attending: Nurse Practitioner | Admitting: Nurse Practitioner

## 2019-06-22 DIAGNOSIS — Z7984 Long term (current) use of oral hypoglycemic drugs: Secondary | ICD-10-CM | POA: Insufficient documentation

## 2019-06-22 DIAGNOSIS — Z79899 Other long term (current) drug therapy: Secondary | ICD-10-CM | POA: Insufficient documentation

## 2019-06-22 DIAGNOSIS — F3130 Bipolar disorder, current episode depressed, mild or moderate severity, unspecified: Secondary | ICD-10-CM | POA: Diagnosis not present

## 2019-06-22 DIAGNOSIS — R45851 Suicidal ideations: Secondary | ICD-10-CM | POA: Diagnosis not present

## 2019-06-22 DIAGNOSIS — F329 Major depressive disorder, single episode, unspecified: Secondary | ICD-10-CM | POA: Diagnosis present

## 2019-06-22 DIAGNOSIS — F331 Major depressive disorder, recurrent, moderate: Secondary | ICD-10-CM | POA: Insufficient documentation

## 2019-06-22 DIAGNOSIS — E119 Type 2 diabetes mellitus without complications: Secondary | ICD-10-CM | POA: Insufficient documentation

## 2019-06-22 DIAGNOSIS — Z20822 Contact with and (suspected) exposure to covid-19: Secondary | ICD-10-CM | POA: Insufficient documentation

## 2019-06-22 DIAGNOSIS — I10 Essential (primary) hypertension: Secondary | ICD-10-CM | POA: Diagnosis not present

## 2019-06-22 DIAGNOSIS — F319 Bipolar disorder, unspecified: Secondary | ICD-10-CM | POA: Diagnosis not present

## 2019-06-22 MED ORDER — ALUM & MAG HYDROXIDE-SIMETH 200-200-20 MG/5ML PO SUSP: 30.0000 mL | ORAL | Status: DC | PRN

## 2019-06-22 MED ORDER — METFORMIN HCL ER 500 MG PO TB24
500.0000 mg | ORAL_TABLET | Freq: Every day | ORAL | Status: DC
  Administered 2019-06-23: 500 mg via ORAL
  Filled 2019-06-22: qty 1

## 2019-06-22 MED ORDER — LORATADINE 10 MG PO TABS
10.0000 mg | ORAL_TABLET | Freq: Every day | ORAL | Status: DC
  Administered 2019-06-23: 10 mg via ORAL
  Filled 2019-06-22: qty 1

## 2019-06-22 MED ORDER — ACETAMINOPHEN 325 MG PO TABS: 650.0000 mg | ORAL_TABLET | Freq: Four times a day (QID) | ORAL | Status: DC | PRN

## 2019-06-22 MED ORDER — CLONIDINE HCL 0.1 MG PO TABS
0.1000 mg | ORAL_TABLET | Freq: Once | ORAL | Status: AC
  Administered 2019-06-23: 0.1 mg via ORAL
  Filled 2019-06-22 (×2): qty 1

## 2019-06-22 MED ORDER — AMLODIPINE BESYLATE 10 MG PO TABS: 10.0000 mg | ORAL_TABLET | Freq: Every day | ORAL | Status: DC

## 2019-06-22 MED ORDER — MAGNESIUM HYDROXIDE 400 MG/5ML PO SUSP: 30.0000 mL | Freq: Every day | ORAL | Status: DC | PRN

## 2019-06-22 MED ORDER — ARIPIPRAZOLE 10 MG PO TABS
10.0000 mg | ORAL_TABLET | Freq: Once | ORAL | Status: AC
  Administered 2019-06-23: 10 mg via ORAL
  Filled 2019-06-22: qty 1

## 2019-06-22 MED ORDER — TRAZODONE HCL 100 MG PO TABS
100.0000 mg | ORAL_TABLET | Freq: Every day | ORAL | Status: DC
  Administered 2019-06-23: 100 mg via ORAL
  Filled 2019-06-22 (×2): qty 14
  Filled 2019-06-22: qty 1

## 2019-06-22 MED ORDER — BENZTROPINE MESYLATE 1 MG PO TABS
1.0000 mg | ORAL_TABLET | Freq: Every day | ORAL | Status: DC
  Administered 2019-06-23: 1 mg via ORAL
  Filled 2019-06-22 (×3): qty 1

## 2019-06-22 NOTE — BH Assessment (Signed)
Comprehensive Clinical Assessment (CCA) Screening, Triage and Referral Note  06/22/2019 Chris Rogers [id number] Patient came to [facility] because sister brought him here.  He says that he has been very depressed.  Patient has been without medications since March or April of this year.    Pt says that he has no plan or intention to harm himself.  He has no SI and has had some previous attempts however.  Pt also denies any HI.  No A/V hallucinations and denies any use of ETOH or illicit drugs.  Patient says he has been very depressed for a long time.  He said he has had 6 hospitalizations since his wife initiated divorce proceedings.  That was started in March of 2019.  He describes feeling despondent, lack of energy.  He says he has lost weight over the last three months, about 20 lbs.  Patient says that he wants to get back on medications.  He had Medicare when he was living in [place].  He says he intends to transfer it from [place] to [place].  Pt is currently living with mother here in [place].  Pt has a flat affect but good eye contact.  He is not responding to internal stimuli nor engaged in delusional thinking.  Pt thought process is logical and coherent.  Pt is oriented x5.  Pt reports his last hospitalization was in August 2020 at [facility].  -Clinician discussed patient care with Nira Conn, FNP.  Barbara Cower recommends patient be continuously assessed at [facility].  Daytime APP to follow up.  Visit Diagnosis:    ICD-10-CM   1. Bipolar affective disorder, current episode depressed, current episode severity unspecified (HCC)  F31.30   2. Essential hypertension  I10    PHQ9 SCORE ONLY 06/22/2019  PHQ-9 Total Score 12    Patient Reported Information How did you hear about [place]? Family/Friend   Referral name: Sister referred im   Referral phone number: No data recorded Whom do you see for routine medical problems? I don't have a doctor Scientist, clinical (histocompatibility and immunogenetics) is in [place])   Practice/Facility Name: No data  recorded  Practice/Facility Phone Number: No data recorded  Name of Contact: No data recorded  Contact Number: No data recorded  Contact Fax Number: No data recorded  Prescriber Name: No data recorded  Prescriber Address (if known): No data recorded What Is the Reason for Your Visit/Call Today? Depresion and wanting to get his medications restarted.  How Long Has This Been Causing You Problems? 1-6 months  Have You Recently Been in Any Inpatient Treatment (Hospital/Detox/Crisis Center/28-Day Program)? No (Last one in 08/2018)   Name/Location of Program/Hospital:No data recorded  How Long Were You There? No data recorded  When Were You Discharged? No data recorded Have You Ever Received Services From [facility] Before? Yes   Who Do You See at [facility]? Nothing in the last year.  Have You Recently Had Any Thoughts About Hurting Yourself? No   Are You Planning to Commit Suicide/Harm Yourself At This time?  No  Have you Recently Had Thoughts About Hurting Someone Karolee Ohs? No   Explanation: No data recorded Have You Used Any Alcohol or Drugs in the Past 24 Hours? No   How Long Ago Did You Use Drugs or Alcohol?  No data recorded  What Did You Use and How Much? No data recorded What Do You Feel Would Help You the Most Today? Assessment Only;Medication  Do You Currently Have a Therapist/Psychiatrist? No   Name of Therapist/Psychiatrist: No data  recorded  Have You Been Recently Discharged From Any Office Practice or Programs? No   Explanation of Discharge From Practice/Program:  No data recorded    CCA Screening Triage Referral Assessment Type of Contact: Face-to-Face   Is this Initial or Reassessment? No data recorded  Date Telepsych consult ordered in [facility]:  No data recorded  Time Telepsych consult ordered in [facility]:  No data recorded Patient Reported Information Reviewed? Yes   Patient Left Without Being Seen? No data recorded  Reason for Not Completing Assessment: No data  recorded Collateral Involvement: No data recorded Does Patient Have a [place]? No data recorded  Name and Contact of Legal Guardian:  No data recorded If Minor and Not Living with Parent(s), Who has Custody? No data recorded Is CPS involved or ever been involved? No data recorded Is APS involved or ever been involved? No data recorded Patient Determined To Be At Risk for Harm To Self or Others Based on Review of Patient Reported Information or Presenting Complaint? No   Method: No data recorded  Availability of Means: No data recorded  Intent: No data recorded  Notification Required: No data recorded  Additional Information for Danger to Others Potential:  No data recorded  Additional Comments for Danger to Others Potential:  No data recorded  Are There Guns or Other Weapons in Your Home?  No data recorded   Types of Guns/Weapons: No data recorded   Are These Weapons Safely Secured?                              No data recorded   Who Could Verify You Are Able To Have These Secured:    No data recorded Do You Have any Outstanding Charges, Pending Court Dates, Parole/Probation? No data recorded Contacted To Inform of Risk of Harm To Self or Others: No data recorded Location of Assessment: [facility] Assessment Services  Does Patient Present under Involuntary Commitment? No   IVC Papers Initial File Date: No data recorded  [place] of Residence: [place]  Patient Currently Receiving the Following Services: Not Receiving Services   Determination of Need: Emergent (2 hours)   Options For Referral: Medication Management   Raymondo Band, LCAS

## 2019-06-22 NOTE — ED Provider Notes (Signed)
Behavioral Health Admission H&P [facility] & OBS)  Date: 06/22/2019 Patient Name: Chris Rogers MRN: [id number] Chief Complaint:  Chief Complaint  Patient presents with   Depression   Chief Complaint/Presenting Problem: Depression and needing to get back on medications.  It has been since April 2021 that he took meds consistently.  Diagnoses:  Final diagnoses:  Bipolar affective disorder, current episode depressed, current episode severity unspecified (HCC)  Essential hypertension    HPI: Chris Rogers is a 44 y.o. male who presents voluntarily to [facility] with his sister due to worsening depression. He reports a history of bipolar depression. Reports a history of 6 inpatient admission since he and his wife separated two years ago. Patient states that he is extremely depressed. Patient reports suicidal ideations without intent or plan. Although, he states that he did not feel safe driving himself to [facility] this evening so he asked his sister to bring him. He reports that he was taking [name] Maintena 400 mg Im monthly and lamictal 200 mg daily when he was in [place]. States that he moved back to [place] in [place] and has not taken any of his medications since that time. He feels that he may need inpatient admission to restart his medications. He also reports a history of diabetes mellitus and hypertension.  He denies auditory and visual hallucinations. No indication that he is responding to internal stimuli.   EKG  sinus bradycardia, HR 43, no evidence of AV blocks. No ST elevation or depression. B/P elevated at 165/98 Patient denies chest pain, palpitations, shortness of breath, dizziness, lightheadedness, recent episodes of syncope/fainting. Denies overdose. Denies use of substances.  Skin warm and dry to touch.  He does have a history of a syncopal episode in March 2021 and was evaluated in the emergency department, neuro and cardiac work up at that time were unremarkable.    CMP essentially  normal TSH 2.897 Hgb A1C 6.5 LDL 114, lipid profile otherwise essentially normal   Total Time spent with patient: 1 hour  Musculoskeletal  Strength & Muscle Tone: within normal limits Gait & Station: normal Patient leans: N/A  Psychiatric Specialty Exam  Presentation General Appearance: Casual;Fairly Groomed  Eye Contact:Fair  Speech:Clear and Coherent;Normal Rate  Speech Volume:Decreased  Handedness:No data recorded  Mood and Affect  Mood:Anxious;Depressed;Hopeless;Worthless  Affect:Congruent;Depressed   Advertising account planner  Descriptions of Associations:Intact  Orientation:Full (Time, Place and Person)  Thought Content:Logical  Hallucinations:Hallucinations: None  Ideas of Reference:None  Suicidal Thoughts:Suicidal Thoughts: No  Homicidal Thoughts:Homicidal Thoughts: No   Sensorium  Memory:Immediate Fair;Recent Fair;Remote Fair  Judgment:Fair  Insight:Fair   Executive Functions  Concentration:Fair  Attention Span:Fair  Recall:Fair  Fund of Knowledge:Fair  Language:Good   Psychomotor Activity  Psychomotor Activity:Psychomotor Activity: Normal   Assets  Assets:Desire for Improvement   Sleep  Sleep:Sleep: Fair   Physical Exam Vitals and nursing note reviewed.  Constitutional:      General: He is not in acute distress.    Appearance: He is not ill-appearing, toxic-appearing or diaphoretic.  HENT:     Head: Normocephalic.     Right Ear: External ear normal.     Left Ear: External ear normal.  Eyes:     Pupils: Pupils are equal, round, and reactive to light.  Cardiovascular:     Rate and Rhythm: Bradycardia present.  Pulmonary:     Effort: Pulmonary effort is normal. No respiratory distress.  Musculoskeletal:        General: Normal range of motion.  Skin:  General: Skin is warm and dry.  Neurological:     General: No focal deficit present.     Mental Status: He is alert and  oriented to person, place, and time.    Review of Systems  Constitutional: Negative for chills, diaphoresis, fever, malaise/fatigue and weight loss.  HENT: Negative for congestion.   Respiratory: Negative for cough, shortness of breath and wheezing.   Cardiovascular: Negative for chest pain and palpitations.  Gastrointestinal: Negative for diarrhea, nausea and vomiting.  Neurological: Negative for dizziness, seizures, loss of consciousness, weakness and headaches.  Psychiatric/Behavioral: Positive for depression and suicidal ideas. Negative for hallucinations, memory loss and substance abuse. The patient is nervous/anxious and has insomnia.   All other systems reviewed and are negative.   Blood pressure (!) 134/100, pulse 66, temperature 97.7 F (36.5 C), temperature source Oral, resp. rate 16, height 5\' 8"  (1.727 m), weight 207 lb 14.3 oz (94.3 kg), SpO2 99 %. Body mass index is 31.61 kg/m.  Past Psychiatric History: He reports a history of bipolar depression. Reports a history of 6 inpatient admission since he and his wife separated two years ago.   Is the patient at risk to self? Yes  Has the patient been a risk to self in the past 6 months? Yes .    Has the patient been a risk to self within the distant past? Yes   Is the patient a risk to others? No   Has the patient been a risk to others in the past 6 months? No   Has the patient been a risk to others within the distant past? Yes   Past Medical History: No past medical history on file. No past surgical history on file.  Family History: No family history on file.  Social History:  Social History   Socioeconomic History   Marital status: Legally Separated    Spouse name: Not on file   Number of children: Not on file   Years of education: Not on file   Highest education level: Not on file  Occupational History   Not on file  Tobacco Use   Smoking status: Never Smoker   Smokeless tobacco: Never Used  Substance  and Sexual Activity   Alcohol use: Not Currently   Drug use: Not Currently   Sexual activity: Not on file  Other Topics Concern   Not on file  Social History Narrative   Not on file   Social Determinants of Health   Financial Resource Strain:    Difficulty of Paying Living Expenses:   Food Insecurity:    Worried About in the Last Year:    Programme researcher, broadcasting/film/video in the Last Year:   Transportation Needs:    Barista (Medical):    Lack of Transportation (Non-Medical):   Physical Activity:    Days of Exercise per Week:    Minutes of Exercise per Session:   Stress:    Feeling of Stress :   Social Connections:    Frequency of Communication with Friends and Family:    Frequency of Social Gatherings with Friends and Family:    Attends Religious Services:    Active Member of Clubs or Organizations:    Attends Freight forwarder:    Marital Status:   Intimate Partner Violence:    Fear of Current or Ex-Partner:    Emotionally Abused:    Physically Abused:    Sexually Abused:     SDOH:  SDOH  Screenings   Alcohol Screen:    Last Alcohol Screening Score (AUDIT):   Depression (PHQ2-9): Medium Risk   PHQ-2 Score: 12  Financial Resource Strain:    Difficulty of Paying Living Expenses:   Food Insecurity:    Worried About Programme researcher, broadcasting/film/video in the Last Year:    Barista in the Last Year:   Housing:    Last Housing Risk Score:   Physical Activity:    Days of Exercise per Week:    Minutes of Exercise per Session:   Social Connections:    Frequency of Communication with Friends and Family:    Frequency of Social Gatherings with Friends and Family:    Attends Religious Services:    Active Member of Clubs or Organizations:    Attends Engineer, structural:    Marital Status:   Stress:    Feeling of Stress :   Tobacco Use: Low Risk    Smoking Tobacco Use: Never Smoker   Smokeless  Tobacco Use: Never Used  Transportation Needs:    Freight forwarder (Medical):    Lack of Transportation (Non-Medical):     Last Labs:  Admission on 06/22/2019  Component Date Value Ref Range Status   Sodium 06/22/2019 137  135 - 145 mmol/L Final   Potassium 06/22/2019 3.8  3.5 - 5.1 mmol/L Final   Chloride 06/22/2019 103  98 - 111 mmol/L Final   CO2 06/22/2019 26  22 - 32 mmol/L Final   Glucose, Bld 06/22/2019 96  70 - 99 mg/dL Final   Glucose reference range applies only to samples taken after fasting for at least 8 hours.   BUN 06/22/2019 5* 6 - 20 mg/dL Final   Creatinine, Ser 06/22/2019 0.99  0.61 - 1.24 mg/dL Final   Calcium 62/37/6283 9.2  8.9 - 10.3 mg/dL Final   Total Protein 15/17/6160 6.7  6.5 - 8.1 g/dL Final   Albumin 73/71/0626 3.8  3.5 - 5.0 g/dL Final   AST 94/85/4627 20  15 - 41 U/L Final   ALT 06/22/2019 18  0 - 44 U/L Final   Alkaline Phosphatase 06/22/2019 57  38 - 126 U/L Final   Total Bilirubin 06/22/2019 0.5  0.3 - 1.2 mg/dL Final   GFR calc non Af Amer 06/22/2019 >60  >60 mL/min Final   GFR calc Af Amer 06/22/2019 >60  >60 mL/min Final   Anion gap 06/22/2019 8  5 - 15 Final   Performed at [facility], [address], [place], [place] [address]   Hgb A1c MFr Bld 06/22/2019 6.5* 4.8 - 5.6 % Final   Comment: (NOTE) Pre diabetes:          5.7%-6.4%  Diabetes:              >6.4%  Glycemic control for   <7.0% adults with diabetes    Mean Plasma Glucose 06/22/2019 139.85  mg/dL Final   Performed at [facility], [address], [place], [place] [address]   Cholesterol 06/22/2019 175  0 - 200 mg/dL Final   Triglycerides 29/93/7169 56  <150 mg/dL Final   HDL 67/89/3810 50  >40 mg/dL Final   Total CHOL/HDL Ratio 06/22/2019 3.5  RATIO Final   VLDL 06/22/2019 11  0 - 40 mg/dL Final   LDL Cholesterol 06/22/2019 114* 0 - 99 mg/dL Final   Comment:        Total Cholesterol/HDL:CHD Risk Coronary Heart Disease Risk  Table  Men   Women  1/2 Average Risk   3.4   3.3  Average Risk       5.0   4.4  2 X Average Risk   9.6   7.1  3 X Average Risk  23.4   11.0        Use the calculated Patient Ratio above and the CHD Risk Table to determine the patient's CHD Risk.        ATP III CLASSIFICATION (LDL):  <100     mg/dL   Optimal  100-129  mg/dL   Near or Above                    Optimal  130-159  mg/dL   Borderline  160-189  mg/dL   High  >190     mg/dL   Very High Performed at [facility] [address], [place], [place] [address]    TSH 06/22/2019 2.897  0.350 - 4.500 uIU/mL Final   Comment: Performed by a 3rd Generation assay with a functional sensitivity of <=0.01 uIU/mL. Performed at [facility], [place] [address] [place], [place] [address]    Glucose-Capillary 06/22/2019 95  70 - 99 mg/dL Final   Glucose reference range applies only to samples taken after fasting for at least 8 hours.    Allergies: Invega [paliperidone] and Topamax [topiramate]  PTA Medications: (Not in a hospital admission)   Medical Decision Making  EKG  sinus bradycardia, HR 43, no evidence of AV blocks. No ST elevation or depression. B/P elevated at 165/98 Patient denies chest pain, palpitations, shortness of breath, dizziness, lightheadedness, recent episodes of syncope/fainting. Denies overdose. Denies use of substances.  Skin warm and dry to touch. He does have a history of a syncopal episode in March 2021 and was evaluated in the emergency department, neuro and cardiac work up at that time were unremarkable.   CMP essentially normal TSH 2.897 Hgb A1C 6.5 LDL 114, lipid profile otherwise essentially normal   Vitals:   06/22/19 2203 06/23/19 0032 06/23/19 0228  BP: (!) 165/98 (!) 163/110 (!) 134/100  Pulse: (!) 53 (!) 48 66  Resp: 18  16  Temp: 97.7 F (36.5 C)    TempSrc: Oral    SpO2: 99%  99%  Weight: 207 lb 14.3 oz (94.3 kg)    Height: 5\' 8"  (1.727 m)        Recommendations  Based on my evaluation the patient does not appear to have an emergency medical condition.  Patient will be placed in continuous assessment area. He will be reevaluated by the treatment team on 06/23/2019. Treatment team to determine final disposition.  He would like to resume [name] Maintena. Will order [name] 10 mg x 1 dose for tonight.   Resume home medications as below:   Metformin 500 mg daily for diabetes mellitus Amlodipine 10 mg daily for HTN HCTZ 12.5 mg daily for HTN  Rozetta Nunnery, NP 06/23/19  3:37 AM

## 2019-06-23 ENCOUNTER — Encounter (HOSPITAL_COMMUNITY): Payer: Self-pay

## 2019-06-23 DIAGNOSIS — F319 Bipolar disorder, unspecified: Secondary | ICD-10-CM | POA: Diagnosis not present

## 2019-06-23 LAB — TSH: TSH: 2.897 u[IU]/mL (ref 0.350–4.500)

## 2019-06-23 LAB — COMPREHENSIVE METABOLIC PANEL
ALT: 18 U/L (ref 0–44)
AST: 20 U/L (ref 15–41)
Albumin: 3.8 g/dL (ref 3.5–5.0)
Alkaline Phosphatase: 57 U/L (ref 38–126)
Anion gap: 8 (ref 5–15)
BUN: 5 mg/dL — ABNORMAL LOW (ref 6–20)
CO2: 26 mmol/L (ref 22–32)
Calcium: 9.2 mg/dL (ref 8.9–10.3)
Chloride: 103 mmol/L (ref 98–111)
Creatinine, Ser: 0.99 mg/dL (ref 0.61–1.24)
GFR calc Af Amer: >60 mL/min (ref >60)
GFR calc non Af Amer: >60 mL/min (ref >60)
Glucose, Bld: 96 mg/dL (ref 70–99)
Potassium: 3.8 mmol/L (ref 3.5–5.1)
Sodium: 137 mmol/L (ref 135–145)
Total Bilirubin: 0.5 mg/dL (ref 0.3–1.2)
Total Protein: 6.7 g/dL (ref 6.5–8.1)

## 2019-06-23 LAB — LIPID PANEL
Cholesterol: 175 mg/dL (ref 0–200)
HDL: 50 mg/dL (ref >40)
LDL Cholesterol: 114 mg/dL — ABNORMAL HIGH (ref 0–99)
Total CHOL/HDL Ratio: 3.5 RATIO
Triglycerides: 56 mg/dL (ref <150)
VLDL: 11 mg/dL (ref 0–40)

## 2019-06-23 LAB — GLUCOSE, CAPILLARY
Glucose-Capillary: 84 mg/dL (ref 70–99)
Glucose-Capillary: 95 mg/dL (ref 70–99)

## 2019-06-23 LAB — HEMOGLOBIN A1C
Hgb A1c MFr Bld: 6.5 % — ABNORMAL HIGH (ref 4.8–5.6)
Mean Plasma Glucose: 139.85 mg/dL

## 2019-06-23 MED ORDER — BENZTROPINE MESYLATE 1 MG PO TABS: 1.0000 mg | ORAL_TABLET | Freq: Every day | ORAL | Status: DC

## 2019-06-23 MED ORDER — HYDROCHLOROTHIAZIDE 12.5 MG PO CAPS
12.5000 mg | ORAL_CAPSULE | Freq: Every day | ORAL | Status: DC
  Administered 2019-06-23: 12.5 mg via ORAL
  Filled 2019-06-23: qty 1

## 2019-06-23 MED ORDER — BENZTROPINE MESYLATE 1 MG PO TABS: 1.0000 mg | ORAL_TABLET | Freq: Every day | ORAL | 0 refills | Status: DC

## 2019-06-23 MED ORDER — TRAZODONE HCL 100 MG PO TABS: 100.0000 mg | ORAL_TABLET | Freq: Every day | ORAL | 0 refills | Status: DC

## 2019-06-23 MED ORDER — LAMOTRIGINE 25 MG PO TABS
25.0000 mg | ORAL_TABLET | Freq: Every day | ORAL | Status: DC
  Administered 2019-06-23: 25 mg via ORAL
  Filled 2019-06-23: qty 14
  Filled 2019-06-23: qty 1
  Filled 2019-06-23: qty 14

## 2019-06-23 MED ORDER — ARIPIPRAZOLE ER 400 MG IM SRER
400.0000 mg | Freq: Once | INTRAMUSCULAR | Status: AC
  Administered 2019-06-23: 400 mg via INTRAMUSCULAR
  Filled 2019-06-23: qty 2

## 2019-06-23 MED ORDER — AMLODIPINE BESYLATE 10 MG PO TABS
10.0000 mg | ORAL_TABLET | Freq: Every day | ORAL | Status: DC
  Filled 2019-06-23: qty 1

## 2019-06-23 MED ORDER — LAMOTRIGINE 25 MG PO TABS: 25.0000 mg | ORAL_TABLET | Freq: Every day | ORAL | 0 refills | Status: DC

## 2019-06-23 MED ORDER — TRAZODONE HCL 50 MG PO TABS: 50.0000 mg | ORAL_TABLET | Freq: Every evening | ORAL | Status: DC | PRN

## 2019-06-23 NOTE — Progress Notes (Signed)
Ha is sleeping without noted distress.

## 2019-06-23 NOTE — ED Notes (Signed)
Covid Test was negative.

## 2019-06-23 NOTE — ED Notes (Signed)
Chris Rogers was awaken and his vital signs were assessed. His heart rate is 45 this AM His Norvasc was held per order.

## 2019-06-23 NOTE — ED Provider Notes (Signed)
FBC/OBS ASAP Discharge Summary  Date and Time: 06/23/2019 10:56 AM  Name: Chris Rogers  MRN:  [id number]   Discharge Diagnoses:  Final diagnoses:  Bipolar affective disorder, current episode depressed, current episode severity unspecified [facility])  Essential hypertension    Subjective: Patient states "I came because I need to get back on my medications."  Patient alert and oriented, patient assessed by nurse practitioner.  Patient participates actively in assessment. Patient denies suicidal ideations.  Patient denies history of suicide attempts, patient denies self-harm behaviors.  Patient denies homicidal ideations.  Patient denies auditory visual hallucinations.  Patient denies symptoms of paranoia. Patient resides in [place] with his mother.  Patient denies access to weapons.  Patient reports he is currently disabled.  Patient reports he recently relocated from [place] after a divorce.  Patient denies alcohol and substance use. Patient reports he does not have outpatient psychiatry established in [place].  Patient reports having been off of his psychotropic medications for approximately 3 months.  Patient reports [name] maintainer has been effective for him in the past. Patient verbalizes plan to follow-up with [facility] for outpatient needs moving forward. Patient gives verbal consent to speak with his sister, patella phone number [phone].  Patient sister denies concerns for patient safety.  Patient sister reports concerns for patient's "self-esteem."  Patient sister reports she believes patient should be restarted on psychotropic medications.  Patient sister reports plan to transport patient home upon discharge.  Stay Summary: Patient is observed overnight  for her safety and stability.  Patient reports he came for assessment because he needed outpatient psychiatric resources.  Patient denies all crisis criteria today.  Patient administered [name] Maintena  400 mg IM prior to discharge.  Total Time spent with patient: 30 minutes  Past Psychiatric History: Bipolar disorder, current episode depressed. Past Medical History: No past medical history on file. No past surgical history on file. Family History: No family history on file. Family Psychiatric History: None reported Social History:  Social History   Substance and Sexual Activity  Alcohol Use Not Currently     Social History   Substance and Sexual Activity  Drug Use Not Currently    Social History   Socioeconomic History  . Marital status: Legally Separated    Spouse name: Not on file  . Number of children: Not on file  . Years of education: Not on file  . Highest education level: Not on file  Occupational History  . Not on file  Tobacco Use  . Smoking status: Never Smoker  . Smokeless tobacco: Never Used  Substance and Sexual Activity  . Alcohol use: Not Currently  . Drug use: Not Currently  . Sexual activity: Not on file  Other Topics Concern  . Not on file  Social History Narrative  . Not on file   Social Determinants of Health   Financial Resource Strain:   . Difficulty of Paying Living Expenses:   Food Insecurity:   . Worried About Programme researcher, broadcasting/film/video in the Last Year:   . Barista in the Last Year:   Transportation Needs:   . Freight forwarder (Medical):   Marland Kitchen Lack of Transportation (Non-Medical):   Physical Activity:   . Days of Exercise per Week:   . Minutes of Exercise per Session:   Stress:   . Feeling of Stress :   Social Connections:   . Frequency of Communication with Friends and Family:   . Frequency of Social  Gatherings with Friends and Family:   . Attends Religious Services:   . Active Member of Clubs or Organizations:   . Attends Archivist Meetings:   Marland Kitchen Marital Status:    SDOH:  SDOH Screenings   Alcohol Screen:   . Last Alcohol Screening Score (AUDIT):   Depression (PHQ2-9): Medium Risk  . PHQ-2 Score: 12   Financial Resource Strain:   . Difficulty of Paying Living Expenses:   Food Insecurity:   . Worried About Charity fundraiser in the Last Year:   . [place] in the Last Year:   Housing:   . Last Housing Risk Score:   Physical Activity:   . Days of Exercise per Week:   . Minutes of Exercise per Session:   Social Connections:   . Frequency of Communication with Friends and Family:   . Frequency of Social Gatherings with Friends and Family:   . Attends Religious Services:   . Active Member of Clubs or Organizations:   . Attends Archivist Meetings:   Marland Kitchen Marital Status:   Stress:   . Feeling of Stress :   Tobacco Use: Low Risk   . Smoking Tobacco Use: Never Smoker  . Smokeless Tobacco Use: Never Used  Transportation Needs:   . Film/video editor (Medical):   Marland Kitchen Lack of Transportation (Non-Medical):     Has this patient used any form of tobacco in the last 30 days? (Cigarettes, Smokeless Tobacco, Cigars, and/or Pipes) A prescription for an FDA-approved tobacco cessation medication was offered at discharge and the patient refused  Current Medications:  Current Facility-Administered Medications  Medication Dose Route Frequency Provider Last Rate Last Admin  . acetaminophen (TYLENOL) tablet 650 mg  650 mg Oral Q6H PRN Lindon Romp A, NP      . alum & mag hydroxide-simeth (MAALOX/MYLANTA) 200-200-20 MG/5ML suspension 30 mL  30 mL Oral Q4H PRN Lindon Romp A, NP      . ARIPiprazole ER ([name] MAINTENA) injection 400 mg  400 mg Intramuscular Once Akintayo, Mojeed, MD      . benztropine (COGENTIN) tablet 1 mg  1 mg Oral QHS Lindon Romp A, NP   1 mg at 06/23/19 0019  . lamoTRIgine (LAMICTAL) tablet 25 mg  25 mg Oral Daily Akintayo, Mojeed, MD      . magnesium hydroxide (MILK OF MAGNESIA) suspension 30 mL  30 mL Oral Daily PRN Lindon Romp A, NP      . traZODone (DESYREL) tablet 100 mg  100 mg Oral QHS Lindon Romp A, NP   100 mg at 06/23/19 0019  . traZODone (DESYREL)  tablet 50 mg  50 mg Oral QHS PRN Corena Pilgrim, MD       Current Outpatient Medications  Medication Sig Dispense Refill  . ARIPiprazole ER ([name] MAINTENA) 400 MG SRER injection Inject 400 mg into the muscle every 30 (thirty) days.    . benztropine (COGENTIN) 1 MG tablet Take 1 tablet (1 mg total) by mouth at bedtime. 30 tablet 0  . traZODone (DESYREL) 100 MG tablet Take 1 tablet (100 mg total) by mouth at bedtime. 30 tablet 0    PTA Medications: (Not in a hospital admission)   Musculoskeletal  Strength & Muscle Tone: within normal limits Gait & Station: normal Patient leans: N/A  Psychiatric Specialty Exam  Presentation  General Appearance: Casual;Fairly Groomed  Eye Contact:Fair  Speech:Clear and Coherent;Normal Rate  Speech Volume:Decreased  Handedness:No data recorded  Mood and Affect  Mood:Anxious;Depressed;Hopeless;Worthless  Affect:Congruent;Depressed   Advertising account planner  Descriptions of Associations:Intact  Orientation:Full (Time, Place and Person)  Thought Content:Logical  Hallucinations:Hallucinations: None  Ideas of Reference:None  Suicidal Thoughts:Suicidal Thoughts: No  Homicidal Thoughts:Homicidal Thoughts: No   Sensorium  Memory:Immediate Fair;Recent Fair;Remote Fair  Judgment:Fair  Insight:Fair   Executive Functions  Concentration:Fair  Attention Span:Fair  Recall:Fair  Fund of Knowledge:Fair  Language:Good   Psychomotor Activity  Psychomotor Activity:Psychomotor Activity: Normal   Assets  Assets:Desire for Improvement   Sleep  Sleep:Sleep: Fair   Physical Exam  Physical Exam ROS Blood pressure 106/63, pulse (!) 106, temperature 97.8 F (36.6 C), temperature source Oral, resp. rate 18, height 5\' 8"  (1.727 m), weight 207 lb 14.3 oz (94.3 kg), SpO2 100 %. Body mass index is 31.61 kg/m.  Demographic Factors:  Male  Loss Factors: NA  Historical  Factors: NA  Risk Reduction Factors:   Living with another person, especially a relative, Positive social support, Positive therapeutic relationship and Positive coping skills or problem solving skills  Continued Clinical Symptoms:  Bipolar Disorder:   Depressive phase  Cognitive Features That Contribute To Risk:  None    Suicide Risk:  Minimal: No identifiable suicidal ideation.  Patients presenting with no risk factors but with morbid ruminations; may be classified as minimal risk based on the severity of the depressive symptoms  Plan Of Care/Follow-up recommendations:  Other:  Follow-up with outpatient psychiatry resources.  Disposition: Discharge  , FNP 06/23/2019, 10:56 AM

## 2019-06-23 NOTE — ED Notes (Signed)
Patient alert, oriented and ambulatory. Patient used phone to call his sister who will be picking him up. Patient received [name] 400 mg IM and tolerated it well. Patient is waiting on ride to pick him up patient remains safe on unit. Monitoring continues.

## 2019-06-23 NOTE — ED Triage Notes (Signed)
Received Cire in the exam room in a calm manner, he was cooperative with the skin exam, Covid test, blood work and gave a urine specimen.

## 2019-06-23 NOTE — ED Notes (Signed)
Nursing Discharge Note:  D:Patient denies SI/HI/AVH at this time. Pt appears calm and cooperative, and no distress noted.  A: All Personal items in locker returned to pt. AVS reviewed with patient and written copy given to patient. Patient given a supply of medication to take home and aware to pick up prescription medications at his pharmacy. Patient aware to call and schedule follow-up appointment.   R:  Patient verbalized understanding. Pt States she will comply with outpatient / other services, and take medications as prescribed.

## 2019-06-23 NOTE — Discharge Instructions (Addendum)

## 2019-06-23 NOTE — ED Notes (Signed)
Chris Rogers was oriented to his new environment, EKG was performed, medicated per order and shortly thereafter drifted off to sleep.

## 2019-06-23 NOTE — ED Notes (Signed)
Patient observed resting with eyes open. Patient denies SI/HI. Patient voices no concerns and is in no acute distress. Q 15 minute checks in progress and safety maintained.

## 2019-06-24 LAB — POC SARS CORONAVIRUS 2 AG: SARS Coronavirus 2 Ag: NEGATIVE

## 2019-07-06 ENCOUNTER — Other Ambulatory Visit: Payer: Self-pay

## 2019-07-06 ENCOUNTER — Encounter (HOSPITAL_COMMUNITY): Payer: Self-pay | Admitting: Emergency Medicine

## 2019-07-06 ENCOUNTER — Ambulatory Visit (HOSPITAL_COMMUNITY)
Admission: EM | Admit: 2019-07-06 | Discharge: 2019-07-07 | Disposition: A | Discharge status: Home / Self Care | Payer: Medicare HMO | Attending: Nurse Practitioner | Admitting: Nurse Practitioner

## 2019-07-06 DIAGNOSIS — F319 Bipolar disorder, unspecified: Secondary | ICD-10-CM | POA: Diagnosis not present

## 2019-07-06 DIAGNOSIS — F3110 Bipolar disorder, current episode manic without psychotic features, unspecified: Secondary | ICD-10-CM | POA: Diagnosis not present

## 2019-07-06 DIAGNOSIS — Z7984 Long term (current) use of oral hypoglycemic drugs: Secondary | ICD-10-CM | POA: Insufficient documentation

## 2019-07-06 DIAGNOSIS — Z20822 Contact with and (suspected) exposure to covid-19: Secondary | ICD-10-CM | POA: Diagnosis not present

## 2019-07-06 DIAGNOSIS — E119 Type 2 diabetes mellitus without complications: Secondary | ICD-10-CM | POA: Insufficient documentation

## 2019-07-06 DIAGNOSIS — I10 Essential (primary) hypertension: Secondary | ICD-10-CM | POA: Diagnosis not present

## 2019-07-06 DIAGNOSIS — F4481 Dissociative identity disorder: Secondary | ICD-10-CM | POA: Insufficient documentation

## 2019-07-06 DIAGNOSIS — Z7901 Long term (current) use of anticoagulants: Secondary | ICD-10-CM | POA: Diagnosis not present

## 2019-07-06 DIAGNOSIS — R45851 Suicidal ideations: Secondary | ICD-10-CM | POA: Insufficient documentation

## 2019-07-06 DIAGNOSIS — Z79899 Other long term (current) drug therapy: Secondary | ICD-10-CM | POA: Diagnosis not present

## 2019-07-06 HISTORY — DX: Essential (primary) hypertension: I10

## 2019-07-06 HISTORY — DX: Type 2 diabetes mellitus without complications: E11.9

## 2019-07-06 HISTORY — DX: Bipolar disorder, unspecified: F31.9

## 2019-07-06 LAB — CBC WITH DIFFERENTIAL/PLATELET
Abs Immature Granulocytes: 0.01 10*3/uL (ref 0.00–0.07)
Basophils Absolute: 0 10*3/uL (ref 0.0–0.1)
Basophils Relative: 0 %
Eosinophils Absolute: 0.1 10*3/uL (ref 0.0–0.5)
Eosinophils Relative: 2 %
HCT: 40 % (ref 39.0–52.0)
Hemoglobin: 12.9 g/dL — ABNORMAL LOW (ref 13.0–17.0)
Immature Granulocytes: 0 %
Lymphocytes Relative: 28 %
Lymphs Abs: 1.9 10*3/uL (ref 0.7–4.0)
MCH: 24.5 pg — ABNORMAL LOW (ref 26.0–34.0)
MCHC: 32.3 g/dL (ref 30.0–36.0)
MCV: 76 fL — ABNORMAL LOW (ref 80.0–100.0)
Monocytes Absolute: 0.5 10*3/uL (ref 0.1–1.0)
Monocytes Relative: 8 %
Neutro Abs: 4.1 10*3/uL (ref 1.7–7.7)
Neutrophils Relative %: 62 %
Platelets: 263 10*3/uL (ref 150–400)
RBC: 5.26 MIL/uL (ref 4.22–5.81)
RDW: 16.1 % — ABNORMAL HIGH (ref 11.5–15.5)
WBC: 6.6 10*3/uL (ref 4.0–10.5)
nRBC: 0 % (ref 0.0–0.2)

## 2019-07-06 LAB — POCT URINE DRUG SCREEN - MANUAL ENTRY (I-SCREEN)
POC Amphetamine UR: NOT DETECTED
POC Buprenorphine (BUP): NOT DETECTED
POC Cocaine UR: NOT DETECTED
POC Marijuana UR: NOT DETECTED
POC Methadone UR: NOT DETECTED
POC Methamphetamine UR: NOT DETECTED
POC Morphine: NOT DETECTED
POC Oxazepam (BZO): NOT DETECTED
POC Oxycodone UR: NOT DETECTED
POC Secobarbital (BAR): NOT DETECTED

## 2019-07-06 MED ORDER — HYDROXYZINE HCL 25 MG PO TABS
25.0000 mg | ORAL_TABLET | Freq: Three times a day (TID) | ORAL | Status: DC | PRN
  Administered 2019-07-06: 25 mg via ORAL
  Filled 2019-07-06: qty 1

## 2019-07-06 MED ORDER — ACETAMINOPHEN 325 MG PO TABS: 650.0000 mg | ORAL_TABLET | Freq: Four times a day (QID) | ORAL | Status: DC | PRN

## 2019-07-06 MED ORDER — CLONIDINE HCL 0.1 MG PO TABS
0.1000 mg | ORAL_TABLET | Freq: Once | ORAL | Status: AC
  Administered 2019-07-06: 0.1 mg via ORAL
  Filled 2019-07-06: qty 1

## 2019-07-06 MED ORDER — AMLODIPINE BESYLATE 10 MG PO TABS
10.0000 mg | ORAL_TABLET | Freq: Every day | ORAL | Status: DC
  Administered 2019-07-06: 10 mg via ORAL
  Filled 2019-07-06: qty 1

## 2019-07-06 MED ORDER — ALUM & MAG HYDROXIDE-SIMETH 200-200-20 MG/5ML PO SUSP
30.0000 mL | ORAL | Status: DC | PRN
  Administered 2019-07-06: 30 mL via ORAL
  Filled 2019-07-06: qty 30

## 2019-07-06 MED ORDER — BENZTROPINE MESYLATE 1 MG PO TABS
1.0000 mg | ORAL_TABLET | Freq: Every day | ORAL | Status: DC
  Administered 2019-07-06: 1 mg via ORAL
  Filled 2019-07-06: qty 1

## 2019-07-06 MED ORDER — QUETIAPINE FUMARATE 100 MG PO TABS: 100.0000 mg | ORAL_TABLET | Freq: Once | ORAL | Status: DC

## 2019-07-06 MED ORDER — AMLODIPINE BESYLATE 10 MG PO TABS: 10.0000 mg | ORAL_TABLET | Freq: Every day | ORAL | 0 refills | Status: DC

## 2019-07-06 MED ORDER — LORAZEPAM 2 MG/ML IJ SOLN: 2.0000 mg | Freq: Once | INTRAMUSCULAR | Status: DC

## 2019-07-06 MED ORDER — MAGNESIUM HYDROXIDE 400 MG/5ML PO SUSP: 30.0000 mL | Freq: Every day | ORAL | Status: DC | PRN

## 2019-07-06 MED ORDER — QUETIAPINE FUMARATE 50 MG PO TABS: 50.0000 mg | ORAL_TABLET | Freq: Every evening | ORAL | 0 refills | Status: DC | PRN

## 2019-07-06 MED ORDER — QUETIAPINE FUMARATE 100 MG PO TABS
100.0000 mg | ORAL_TABLET | Freq: Every evening | ORAL | Status: DC | PRN
  Administered 2019-07-06: 100 mg via ORAL
  Filled 2019-07-06: qty 1

## 2019-07-06 MED ORDER — LAMOTRIGINE 25 MG PO TABS
25.0000 mg | ORAL_TABLET | Freq: Every day | ORAL | Status: DC
  Administered 2019-07-06: 25 mg via ORAL
  Filled 2019-07-06: qty 1
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
## 2019-07-06 NOTE — Progress Notes (Addendum)
Patient requests to leave AMA. Pt refuses to stay for the night states he wants to go home and sleep on his be. " I cant sleep on this fucking cot" patient explained the benefits of staying but continues to refuse to stay. States he will go to his mothers home or his sisters home tonight. Pt spoke to sister on phone.  Pt explained discharge instructions and educated to follow up with primary care doctor for Bp management and prescription sent to pharmacy . patient belongings given back to him. Patient appreciative of care. Patient escorted off unit without incident.

## 2019-07-06 NOTE — Discharge Instructions (Addendum)
Follow up with your primary care provider regarding your blood pressure.

## 2019-07-06 NOTE — ED Provider Notes (Signed)
FBC/OBS ASAP Discharge Summary  Date and Time: 07/06/2019 11:56 PM  Name: Chris Rogers  MRN:  [id number]   Discharge Diagnoses:  Final diagnoses:  Bipolar I disorder, most recent episode (or current) manic (HCC)    Subjective: Patient presented to [facility] due to reports of mania. His speech  pressured and tangential. Patient reported that he is "hyper" and described being verbally aggressive towards people. He reported that he  "cussed out" his mother today. He also described having a verbal altercation with a desk clerk at a hotel resulting in law enforcement being called. Patient reported that he has "multiple personalities" including "the beast" who is aggressive and "the blessing" who is friendly to people. Patient reported that he has not been sleeping and has been eating less. He denied homicidal ideation but indicated that can become angry and aggressive. He denied suicidal ideation. He reported in a previous assessment that he has attempted suicide in the past. He denied auditory and visual hallucinations. He denied alcohol or other substance use. Urine drug screen was negative.  Stay Summary: Patient was admitted for continuous assessment. Patient requested to discharge AMA. Stated that he is uncomfortable sleeping at [facility]. Patient presented at hypomanic, however, he denies suicidal thoughts, homicidal thoughts, and AVH.He does not meet criteria for IVC. He was encouraged to follow up with a primary care provider for hypertension. An electronic prescription for amlodipine 10 mg #14 was sent to [facility].   Total Time spent with patient: 30 minutes  Past Psychiatric History: Bipolar I Past Medical History:  Past Medical History:  Diagnosis Date   Bipolar 1 disorder (HCC)    Diabetes mellitus, type II (HCC)    Hypertension     Past Surgical History:  Procedure Laterality Date   KNEE SURGERY     Family History: History reviewed. No pertinent family history. Family Psychiatric History:  Unknown Social History:  Social History   Substance and Sexual Activity  Alcohol Use Not Currently     Social History   Substance and Sexual Activity  Drug Use Not Currently    Social History   Socioeconomic History   Marital status: Divorced    Spouse name: Not on file   Number of children: Not on file   Years of education: Not on file   Highest education level: Not on file  Occupational History   Not on file  Tobacco Use   Smoking status: Never Smoker   Smokeless tobacco: Never Used  Vaping Use   Vaping Use: Never used  Substance and Sexual Activity   Alcohol use: Not Currently   Drug use: Not Currently   Sexual activity: Not on file  Other Topics Concern   Not on file  Social History Narrative   Not on file   Social Determinants of Health   Financial Resource Strain:    Difficulty of Paying Living Expenses:   Food Insecurity:    Worried About Programme researcher, broadcasting/film/video in the Last Year:    Barista in the Last Year:   Transportation Needs:    Freight forwarder (Medical):    Lack of Transportation (Non-Medical):   Physical Activity:    Days of Exercise per Week:    Minutes of Exercise per Session:   Stress:    Feeling of Stress :   Social Connections:    Frequency of Communication with Friends and Family:    Frequency of Social Gatherings with Friends and Family:    Attends Religious  Services:    Active Member of Clubs or Organizations:    Attends Engineer, structural:    Marital Status:    SDOH:  SDOH Screenings   Alcohol Screen:    Last Alcohol Screening Score (AUDIT):   Depression (PHQ2-9): Medium Risk   PHQ-2 Score: 8  Financial Resource Strain:    Difficulty of Paying Living Expenses:   Food Insecurity:    Worried About Programme researcher, broadcasting/film/video in the Last Year:    Barista in the Last Year:   Housing:    Last Housing Risk Score:   Physical Activity:    Days of Exercise per Week:     Minutes of Exercise per Session:   Social Connections:    Frequency of Communication with Friends and Family:    Frequency of Social Gatherings with Friends and Family:    Attends Religious Services:    Active Member of Clubs or Organizations:    Attends Engineer, structural:    Marital Status:   Stress:    Feeling of Stress :   Tobacco Use: Low Risk    Smoking Tobacco Use: Never Smoker   Smokeless Tobacco Use: Never Used  Transportation Needs:    Freight forwarder (Medical):    Lack of Transportation (Non-Medical):     Has this patient used any form of tobacco in the last 30 days? (Cigarettes, Smokeless Tobacco, Cigars, and/or Pipes) Prescription not provided because: denies nicotine use  Current Medications:  Current Facility-Administered Medications  Medication Dose Route Frequency Provider Last Rate Last Admin   acetaminophen (TYLENOL) tablet 650 mg  650 mg Oral Q6H PRN Jackelyn Poling, NP       alum & mag hydroxide-simeth (MAALOX/MYLANTA) 200-200-20 MG/5ML suspension 30 mL  30 mL Oral Q4H PRN Nira Conn A, NP   30 mL at 07/06/19 2247   amLODipine (NORVASC) tablet 10 mg  10 mg Oral Daily Nira Conn A, NP   10 mg at 07/06/19 2136   benztropine (COGENTIN) tablet 1 mg  1 mg Oral QHS Nira Conn A, NP   1 mg at 07/06/19 2136   hydrOXYzine (ATARAX/VISTARIL) tablet 25 mg  25 mg Oral TID PRN Jackelyn Poling, NP   25 mg at 07/06/19 2232   lamoTRIgine (LAMICTAL) tablet 25 mg  25 mg Oral Daily Nira Conn A, NP   25 mg at 07/06/19 2136   LORazepam (ATIVAN) injection 2 mg  2 mg Intramuscular Once Nira Conn A, NP       magnesium hydroxide (MILK OF MAGNESIA) suspension 30 mL  30 mL Oral Daily PRN Nira Conn A, NP       QUEtiapine (SEROQUEL) tablet 100 mg  100 mg Oral QHS PRN Nira Conn A, NP   100 mg at 07/06/19 2232   QUEtiapine (SEROQUEL) tablet 100 mg  100 mg Oral Once Jackelyn Poling, NP       Current Outpatient Medications  Medication Sig  Dispense Refill   amLODipine (NORVASC) 10 MG tablet Take 1 tablet (10 mg total) by mouth daily. 14 tablet 0   ARIPiprazole ER ([name] MAINTENA) 400 MG SRER injection Inject 400 mg into the muscle every 30 (thirty) days.     benztropine (COGENTIN) 1 MG tablet Take 1 tablet (1 mg total) by mouth at bedtime. 30 tablet 0   doxazosin (CARDURA) 8 MG tablet Take 8 mg by mouth daily.     hydrochlorothiazide (HYDRODIURIL) 25 MG tablet Take 25 mg  by mouth daily.     lamoTRIgine (LAMICTAL) 25 MG tablet Take 1 tablet (25 mg total) by mouth daily. 30 tablet 0   metFORMIN (GLUCOPHAGE-XR) 500 MG 24 hr tablet Take 500 mg by mouth daily.     QUEtiapine (SEROQUEL) 50 MG tablet Take 1 tablet (50 mg total) by mouth at bedtime as needed for up to 14 days. 14 tablet 0    PTA Medications: (Not in a hospital admission)   Musculoskeletal  Strength & Muscle Tone: within normal limits Gait & Station: normal Patient leans: N/A  Psychiatric Specialty Exam  Presentation  General Appearance: Casual;Neat  Eye Contact:Fair  Speech:Pressured  Speech Volume:Increased  Handedness:No data recorded  Mood and Affect  Mood:Euphoric  Affect:Congruent   Thought Process  Thought Processes:Disorganized  Descriptions of Associations:Tangential  Orientation:Full (Time, Place and Person)  Thought Content:Tangential  Hallucinations:Hallucinations: None  Ideas of Reference:None  Suicidal Thoughts:Suicidal Thoughts: No  Homicidal Thoughts:Homicidal Thoughts: No   Sensorium  Memory:Immediate Good;Recent Good  Judgment:Intact  Insight:Fair   Executive Functions  Concentration:Poor  Attention Span:Poor  Recall:Good  Fund of Knowledge:Fair  Language:Good   Psychomotor Activity  Psychomotor Activity:Psychomotor Activity: Increased   Assets  Assets:Desire for Improvement;Leisure Time;Physical Health   Sleep  Sleep:Sleep: Poor   Physical Exam  Physical Exam ROS Blood  pressure (!) 178/131, pulse 89, temperature 98.2 F (36.8 C), temperature source Oral, resp. rate 18, height 5\' 9"  (1.753 m), weight 216 lb (98 kg), SpO2 98 %. Body mass index is 31.9 kg/m.  Demographic Factors:  Male and Divorced or widowed  Loss Factors: Decrease in vocational status  Historical Factors: NA  Risk Reduction Factors:   Sense of responsibility to family, Religious beliefs about death and Living with another person, especially a relative  Continued Clinical Symptoms:  Previous Psychiatric Diagnoses and Treatments Medical Diagnoses and Treatments/Surgeries  Cognitive Features That Contribute To Risk:  None    Suicide Risk:  Minimal: No identifiable suicidal ideation.  Patients presenting with no risk factors but with morbid ruminations; may be classified as minimal risk based on the severity of the depressive symptoms  Plan Of Care/Follow-up recommendations:  Activity:  as tolerated Diet:  low sodium, heart healthy Other:  Follow-up with primary care provider  Disposition: No evidence of imminent risk to self or others at present.   Supportive therapy provided about ongoing stressors. Discussed crisis plan, support from social network, calling 911, coming to the Emergency Department, and calling Suicide Hotline. Patient has an appointment at [facility] on 07/09/2019. Encouragd to keep his appointment.   09/09/2019, NP 07/06/2019, 11:56 PM

## 2019-07-06 NOTE — ED Notes (Signed)
Patient blood pressure extremely high. RN, TTS informed.

## 2019-07-06 NOTE — ED Triage Notes (Signed)
Pt states he is here because he hasnt had quality sleep . States he has had two hours of sleep nightly. Patient  Talking to self but denies any auditory or visual hallucinations. Pt with  Delusional thoughts I have an airplane with a male pilot, so that no one can shoot [place] down.  Denies any suicidal or homicidal ideation. Pt calm and cooperative with assessment.

## 2019-07-06 NOTE — ED Notes (Signed)
Bernie's [id number] test results came back NEG

## 2019-07-06 NOTE — ED Notes (Signed)
Pt brought in to [facility] By self on Voluntary status with c/o of insomnia. Presents hyperactive  with euphoric affect, fast and tangential speech at times. Pt  ambulatory with a steady gait. Denies SI, HI, AVH and pain at this time. Pt intermittently responding to self but denies auditory hallucinations states " I'm just talking to myself". Denies substance use. Skin assessment completed and belonging searched per protocol. Items deemed contraband secured in locker number 25.continuous Observation initiated.  Pt remains safe on unit.

## 2019-07-06 NOTE — ED Notes (Signed)
Np Nira Conn Made aware of elevated BP. Awaiting further orders.

## 2019-07-06 NOTE — ED Notes (Signed)
Chris Rogers's belongings placed in locker 25

## 2019-07-06 NOTE — ED Notes (Signed)
Patient Blood pressure extremely high. unformed TTS and RN
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
## 2019-07-06 NOTE — ED Notes (Signed)
Np Nira Conn made aware of current BP. No further orders. Will recheck in 1 hour

## 2019-07-07 DIAGNOSIS — F319 Bipolar disorder, unspecified: Secondary | ICD-10-CM | POA: Diagnosis not present

## 2019-07-07 LAB — COMPREHENSIVE METABOLIC PANEL
ALT: 50 U/L — ABNORMAL HIGH (ref 0–44)
AST: 123 U/L — ABNORMAL HIGH (ref 15–41)
Albumin: 4.1 g/dL (ref 3.5–5.0)
Alkaline Phosphatase: 63 U/L (ref 38–126)
Anion gap: 12 (ref 5–15)
BUN: 7 mg/dL (ref 6–20)
CO2: 23 mmol/L (ref 22–32)
Calcium: 9.1 mg/dL (ref 8.9–10.3)
Chloride: 102 mmol/L (ref 98–111)
Creatinine, Ser: 0.99 mg/dL (ref 0.61–1.24)
GFR calc Af Amer: >60 mL/min (ref >60)
GFR calc non Af Amer: >60 mL/min (ref >60)
Glucose, Bld: 100 mg/dL — ABNORMAL HIGH (ref 70–99)
Potassium: 3.7 mmol/L (ref 3.5–5.1)
Sodium: 137 mmol/L (ref 135–145)
Total Bilirubin: 1.1 mg/dL (ref 0.3–1.2)
Total Protein: 7 g/dL (ref 6.5–8.1)

## 2019-07-08 LAB — POC SARS CORONAVIRUS 2 AG: SARS Coronavirus 2 Ag: NEGATIVE

## 2019-07-09 ENCOUNTER — Ambulatory Visit (HOSPITAL_COMMUNITY): Payer: Medicare HMO | Admitting: Psychiatry

## 2019-07-09 ENCOUNTER — Encounter (HOSPITAL_COMMUNITY): Payer: Self-pay | Admitting: Psychiatry

## 2019-07-17 MED ORDER — amLODIPine (NORVASC) 10 MG tablet
10 | ORAL_TABLET | ORAL | 3 refills | Status: AC
Start: 2019-07-17 — End: ?

## 2019-07-17 MED ORDER — hydroCHLOROthiazide (HYDRODIURIL) 25 MG tablet
25 | ORAL_TABLET | ORAL | 3 refills | 90.00000 days | Status: AC
Start: 2019-07-17 — End: ?

## 2019-07-17 MED ORDER — doxazosin (CARDURA) 8 MG tablet
8 | ORAL_TABLET | ORAL | 3 refills | Status: AC
Start: 2019-07-17 — End: ?

## 2019-07-17 NOTE — Unmapped (Signed)
Last visit 02/21/2019 Candie Mile, MD  No future appointments.  Requested Prescriptions     Pending Prescriptions Disp Refills   ??? hydroCHLOROthiazide (HYDRODIURIL) 25 MG tablet [Pharmacy Med Name: HYDROCHLOROTHIAZIDE 25 MG Tablet] 90 tablet      Sig: TAKE 1 TABLET EVERY DAY FOR HYPERTENSION   ??? doxazosin (CARDURA) 8 MG tablet [Pharmacy Med Name: DOXAZOSIN MESYLATE 8 MG Tablet] 90 tablet      Sig: TAKE 1 TABLET EVERY DAY AT BEDTIME   ??? amLODIPine (NORVASC) 10 MG tablet [Pharmacy Med Name: AMLODIPINE BESYLATE 10 MG Tablet] 90 tablet      Sig: TAKE 1 TABLET EVERY DAY FOR HYPERTENSION       No flowsheet data found.     Allergies and pharmacy verified.     Forwarding to provider for approval or denial

## 2019-07-24 ENCOUNTER — Encounter (HOSPITAL_COMMUNITY): Payer: Self-pay | Admitting: Psychiatry

## 2019-07-24 ENCOUNTER — Ambulatory Visit (INDEPENDENT_AMBULATORY_CARE_PROVIDER_SITE_OTHER): Payer: Medicare HMO | Admitting: *Deleted

## 2019-07-24 ENCOUNTER — Ambulatory Visit (INDEPENDENT_AMBULATORY_CARE_PROVIDER_SITE_OTHER): Payer: Medicare HMO | Admitting: Psychiatry

## 2019-07-24 ENCOUNTER — Encounter (HOSPITAL_COMMUNITY): Payer: Self-pay

## 2019-07-24 ENCOUNTER — Other Ambulatory Visit: Payer: Self-pay

## 2019-07-24 VITALS — BP 147/96 | HR 80 | Temp 98.2°F | Ht 69.0 in | Wt 210.0 lb

## 2019-07-24 DIAGNOSIS — F3130 Bipolar disorder, current episode depressed, mild or moderate severity, unspecified: Secondary | ICD-10-CM

## 2019-07-24 MED ORDER — QUETIAPINE FUMARATE 50 MG PO TABS: 100.0000 mg | ORAL_TABLET | Freq: Every evening | ORAL | 0 refills | Status: DC | PRN

## 2019-07-24 MED ORDER — BENZTROPINE MESYLATE 1 MG PO TABS: 1.0000 mg | ORAL_TABLET | Freq: Two times a day (BID) | ORAL | 2 refills | Status: DC

## 2019-07-24 MED ORDER — ARIPIPRAZOLE ER 400 MG IM PRSY
400.0000 mg | PREFILLED_SYRINGE | Freq: Once | INTRAMUSCULAR | Status: AC
  Administered 2019-07-24: 400 mg via INTRAMUSCULAR

## 2019-07-24 MED ORDER — LAMOTRIGINE 200 MG PO TABS: 200.0000 mg | ORAL_TABLET | Freq: Every day | ORAL | 2 refills | Status: DC

## 2019-07-24 MED ORDER — ARIPIPRAZOLE ER 400 MG IM SRER: 400.0000 mg | INTRAMUSCULAR | 11 refills | Status: DC
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
## 2019-07-24 NOTE — Progress Notes (Signed)
Patient ID: Chris Rogers, male   DOB: 07-Nov-1975, 44 y.o.   MRN: [id number] In has scheduled for his first time visit with a provider, Toy Cookey NP and then to writer for his injection of [name] M. 400 mg IM which was given per [facility] in his R deltoid. He tolerated it well. He has had [name] in the past and states he likes it a lot and it does well for him. He denies any current psychotic sx. He is currently living with his mom. He gets a disability check of approx 1000 a month and Medicare. After getting his shot Clinical research associate took him to the pharmacy for help with patient assistance for his shot. He states in the past he was told with his medicare the shot would be 800 which of course is not reasonable for him. He was pleasant and appropriate, exhibited humor. Scheduled back for one month for next injection.

## 2019-08-22 ENCOUNTER — Ambulatory Visit (HOSPITAL_COMMUNITY): Payer: Medicare (Managed Care)

## 2019-09-16 ENCOUNTER — Encounter (HOSPITAL_COMMUNITY): Payer: Self-pay

## 2019-09-16 ENCOUNTER — Other Ambulatory Visit: Payer: Self-pay

## 2019-09-16 ENCOUNTER — Ambulatory Visit (INDEPENDENT_AMBULATORY_CARE_PROVIDER_SITE_OTHER): Payer: Medicare (Managed Care) | Admitting: *Deleted

## 2019-09-16 VITALS — BP 136/86 | HR 87 | Ht 67.5 in | Wt 214.0 lb

## 2019-09-16 DIAGNOSIS — F3130 Bipolar disorder, current episode depressed, mild or moderate severity, unspecified: Secondary | ICD-10-CM

## 2019-09-16 MED ORDER — ARIPIPRAZOLE ER 400 MG IM SRER: 400.0000 mg | INTRAMUSCULAR | 11 refills | Status: DC

## 2019-09-16 NOTE — Progress Notes (Signed)
Patient arrived today for his 400 mg [name] Maintena Injection. He is  pleasant and appropriate, exhibited humor. Scheduled back for one month for next injection.  Tolerated Injection well in Left-Arm.

## 2019-10-05 ENCOUNTER — Other Ambulatory Visit: Payer: Self-pay

## 2019-10-05 DIAGNOSIS — E119 Type 2 diabetes mellitus without complications: Secondary | ICD-10-CM | POA: Insufficient documentation

## 2019-10-05 DIAGNOSIS — S0181XA Laceration without foreign body of other part of head, initial encounter: Secondary | ICD-10-CM | POA: Diagnosis present

## 2019-10-05 DIAGNOSIS — U071 COVID-19: Secondary | ICD-10-CM | POA: Diagnosis not present

## 2019-10-05 DIAGNOSIS — W133XXA Fall through floor, initial encounter: Secondary | ICD-10-CM | POA: Diagnosis not present

## 2019-10-05 DIAGNOSIS — I10 Essential (primary) hypertension: Secondary | ICD-10-CM | POA: Insufficient documentation

## 2019-10-05 DIAGNOSIS — Z79899 Other long term (current) drug therapy: Secondary | ICD-10-CM | POA: Insufficient documentation

## 2019-10-05 DIAGNOSIS — Y9289 Other specified places as the place of occurrence of the external cause: Secondary | ICD-10-CM | POA: Insufficient documentation

## 2019-10-05 DIAGNOSIS — R55 Syncope and collapse: Secondary | ICD-10-CM | POA: Diagnosis not present

## 2019-10-05 DIAGNOSIS — Y9301 Activity, walking, marching and hiking: Secondary | ICD-10-CM | POA: Diagnosis not present

## 2019-10-05 DIAGNOSIS — Z7984 Long term (current) use of oral hypoglycemic drugs: Secondary | ICD-10-CM | POA: Diagnosis not present

## 2019-10-06 ENCOUNTER — Emergency Department (HOSPITAL_COMMUNITY): Payer: Medicare (Managed Care)

## 2019-10-06 ENCOUNTER — Encounter (HOSPITAL_COMMUNITY): Payer: Self-pay | Admitting: Emergency Medicine

## 2019-10-06 ENCOUNTER — Emergency Department (HOSPITAL_COMMUNITY)
Admission: EM | Admit: 2019-10-06 | Discharge: 2019-10-06 | Disposition: A | Discharge status: Home / Self Care | Payer: Medicare (Managed Care) | Attending: Emergency Medicine | Admitting: Emergency Medicine

## 2019-10-06 DIAGNOSIS — R55 Syncope and collapse: Secondary | ICD-10-CM

## 2019-10-06 DIAGNOSIS — S0181XA Laceration without foreign body of other part of head, initial encounter: Secondary | ICD-10-CM

## 2019-10-06 LAB — CBC WITH DIFFERENTIAL/PLATELET
Abs Immature Granulocytes: 0.03 10*3/uL (ref 0.00–0.07)
Basophils Absolute: 0 10*3/uL (ref 0.0–0.1)
Basophils Relative: 0 %
Eosinophils Absolute: 0 10*3/uL (ref 0.0–0.5)
Eosinophils Relative: 0 %
HCT: 46.3 % (ref 39.0–52.0)
Hemoglobin: 15.3 g/dL (ref 13.0–17.0)
Immature Granulocytes: 0 %
Lymphocytes Relative: 15 %
Lymphs Abs: 1.1 10*3/uL (ref 0.7–4.0)
MCH: 25.7 pg — ABNORMAL LOW (ref 26.0–34.0)
MCHC: 33 g/dL (ref 30.0–36.0)
MCV: 77.7 fL — ABNORMAL LOW (ref 80.0–100.0)
Monocytes Absolute: 0.3 10*3/uL (ref 0.1–1.0)
Monocytes Relative: 4 %
Neutro Abs: 5.9 10*3/uL (ref 1.7–7.7)
Neutrophils Relative %: 81 %
Platelets: 263 10*3/uL (ref 150–400)
RBC: 5.96 MIL/uL — ABNORMAL HIGH (ref 4.22–5.81)
RDW: 14.1 % (ref 11.5–15.5)
WBC: 7.3 10*3/uL (ref 4.0–10.5)
nRBC: 0 % (ref 0.0–0.2)

## 2019-10-06 LAB — BASIC METABOLIC PANEL
Anion gap: 10 (ref 5–15)
BUN: 10 mg/dL (ref 6–20)
CO2: 22 mmol/L (ref 22–32)
Calcium: 9.4 mg/dL (ref 8.9–10.3)
Chloride: 103 mmol/L (ref 98–111)
Creatinine, Ser: 1 mg/dL (ref 0.61–1.24)
GFR calc Af Amer: >60 mL/min (ref >60)
GFR calc non Af Amer: >60 mL/min (ref >60)
Glucose, Bld: 117 mg/dL — ABNORMAL HIGH (ref 70–99)
Potassium: 4 mmol/L (ref 3.5–5.1)
Sodium: 135 mmol/L (ref 135–145)

## 2019-10-06 NOTE — ED Triage Notes (Signed)
Pt reports that he was diagnosed with COVID on 9/28 and "keeps waking up on the floor." He presents with a laceration above his R eye. A&Ox4. VSS.

## 2019-10-06 NOTE — Discharge Instructions (Addendum)
Local wound care with bacitracin and dressing changes twice daily.  Drink plenty of fluids and get plenty of rest.  Sutures are to be removed in 7 days.  Please follow-up with your primary doctor for this.  Return in the meantime if you develop pus draining from the wound, increasing pain, or other new and concerning symptoms.

## 2019-10-06 NOTE — ED Provider Notes (Signed)
[facility]-EMERGENCY DEPT Provider Note   CSN: [id number] Arrival date & time: 10/05/19  2335     History Chief Complaint  Patient presents with  . Facial Laceration    COVID    Chris Rogers is a 44 y.o. male.  Patient is a 44 year old male with history of bipolar, hypertension, diabetes.  He presents today for evaluation of syncope and facial laceration.  Patient was diagnosed several days ago with COVID-19.  He has had minimal symptoms, but states that today he found himself on the floor on several occasions.  The most recent time, he struck his head and has a laceration above his right eyebrow.  He denies any neck pain.  He denies headache.  He denies any recent symptoms such as cough, fever, nausea, vomiting, or diarrhea.  The history is provided by the patient.       Past Medical History:  Diagnosis Date  . Bipolar 1 disorder (HCC)   . Diabetes mellitus, type II (HCC)   . Hypertension     Patient Active Problem List   Diagnosis Date Noted  . Major depressive disorder, recurrent episode, moderate (HCC)   . Bipolar I disorder, most recent episode depressed (HCC)   . Affective psychosis, bipolar (HCC)     Past Surgical History:  Procedure Laterality Date  . KNEE SURGERY         History reviewed. No pertinent family history.  Social History   Tobacco Use  . Smoking status: Never Smoker  . Smokeless tobacco: Never Used  Vaping Use  . Vaping Use: Never used  Substance Use Topics  . Alcohol use: Not Currently  . Drug use: Not Currently    Home Medications Prior to Admission medications   Medication Sig Start Date End Date Taking? Authorizing Provider  amLODipine (NORVASC) 10 MG tablet Take 1 tablet (10 mg total) by mouth daily. 07/06/19   Jackelyn Poling, NP  ARIPiprazole ER ([name] MAINTENA) 400 MG SRER injection Inject 2 mLs (400 mg total) into the muscle every 30 (thirty) days for 11 doses. 09/16/19 07/13/20  Shanna Cisco, NP   benztropine (COGENTIN) 1 MG tablet Take 1 tablet (1 mg total) by mouth 2 (two) times daily. 07/24/19   Shanna Cisco, NP  doxazosin (CARDURA) 8 MG tablet Take 8 mg by mouth daily. 10/29/18   [provider]  hydrochlorothiazide (HYDRODIURIL) 25 MG tablet Take 25 mg by mouth daily. 10/29/18   [provider]  lamoTRIgine (LAMICTAL) 200 MG tablet Take 1 tablet (200 mg total) by mouth daily. 07/24/19   Shanna Cisco, NP  metFORMIN (GLUCOPHAGE-XR) 500 MG 24 hr tablet Take 500 mg by mouth daily.    [provider]  QUEtiapine (SEROQUEL) 50 MG tablet Take 2 tablets (100 mg total) by mouth at bedtime as needed for up to 14 days. 07/24/19 08/07/19  Shanna Cisco, NP    Allergies    Invega [paliperidone] and Topamax [topiramate]  Review of Systems   Review of Systems  All other systems reviewed and are negative.   Physical Exam Updated Vital Signs BP 131/84 (BP Location: Left Arm)   Pulse 86   Temp 97.8 F (36.6 C) (Tympanic)   Resp 18   SpO2 98%   Physical Exam Vitals and nursing note reviewed.  Constitutional:      General: He is not in acute distress.    Appearance: He is well-developed. He is not diaphoretic.  HENT:  Head: Normocephalic.     Comments: There is a flapped laceration that is C-shaped to the right forehead and extending through a section of his eyebrow. Eyes:     Extraocular Movements: Extraocular movements intact.     Pupils: Pupils are equal, round, and reactive to light.     Comments: Pupils are equally reactive.  He has full range of motion of his extraocular muscles with no diplopia.  There is no hyphema or corneal abrasion noted.  Cardiovascular:     Rate and Rhythm: Normal rate and regular rhythm.     Heart sounds: No murmur heard.  No friction rub.  Pulmonary:     Effort: Pulmonary effort is normal. No respiratory distress.     Breath sounds: Normal breath sounds. No wheezing or rales.  Abdominal:     General:  Bowel sounds are normal. There is no distension.     Palpations: Abdomen is soft.     Tenderness: There is no abdominal tenderness.  Musculoskeletal:        General: Normal range of motion.     Cervical back: Normal range of motion and neck supple.  Skin:    General: Skin is warm and dry.  Neurological:     General: No focal deficit present.     Mental Status: He is alert and oriented to person, place, and time.     Cranial Nerves: No cranial nerve deficit.     Motor: No weakness.     Coordination: Coordination normal.     ED Results / Procedures / Treatments   Labs (all labs ordered are listed, but only abnormal results are displayed) Labs Reviewed  BASIC METABOLIC PANEL  CBC WITH DIFFERENTIAL/PLATELET    EKG EKG Interpretation  Date/Time:  Sunday October 06 2019 03:00:40 EDT Ventricular Rate:  57 PR Interval:    QRS Duration: 92 QT Interval:  433 QTC Calculation: 422 R Axis:   55 Text Interpretation: Sinus rhythm Borderline prolonged PR interval Anteroseptal infarct, old No significant change since 07/06/2019 Confirmed by Geoffery Lyons ([id number]) on 10/06/2019 3:03:55 AM   Radiology No results found.  Procedures Procedures (including critical care time)  Medications Ordered in ED Medications - No data to display  ED Course  I have reviewed the triage vital signs and the nursing notes.  Pertinent labs & imaging results that were available during my care of the patient were reviewed by me and considered in my medical decision making (see chart for details).    MDM Rules/Calculators/A&P  Patient presents here after some sort of syncopal episode that occurred at home.  He has a laceration above his right eyebrow that was repaired as below.  Patient is neurologically intact with stable vital signs.  His laboratory studies including CBC and basic metabolic panel are all unremarkable.  Head CT is negative both for injury or cause of these falling out spells.  At this  point, patient awake and alert and is neurologically intact.  Discharge seems appropriate with as needed return/follow-up.  LACERATION REPAIR Performed by: Geoffery Lyons Authorized by: Geoffery Lyons Consent: Verbal consent obtained. Risks and benefits: risks, benefits and alternatives were discussed Consent given by: patient Patient identity confirmed: provided demographic data Prepped and Draped in normal sterile fashion Wound explored  Laceration Location: right forehead  Laceration Length: 3.5cm  No Foreign Bodies seen or palpated  Anesthesia: local infiltration  Local anesthetic: lidocaine 1% without epinephrine  Anesthetic total: 3 ml  Irrigation method: syringe Amount of cleaning: standard  Skin closure: 5-0 prolene  Number of sutures: 8  Technique: simple interrupted  Patient tolerance: Patient tolerated the procedure well with no immediate complications.   Final Clinical Impression(s) / ED Diagnoses Final diagnoses:  None    Rx / DC Orders ED Discharge Orders    None       Geoffery Lyons, MD 10/06/19 [phone]

## 2019-10-16 ENCOUNTER — Ambulatory Visit (HOSPITAL_COMMUNITY): Payer: Medicare (Managed Care) | Admitting: *Deleted

## 2019-10-16 ENCOUNTER — Encounter (HOSPITAL_COMMUNITY): Payer: Self-pay

## 2019-10-16 ENCOUNTER — Other Ambulatory Visit: Payer: Self-pay

## 2019-10-16 ENCOUNTER — Other Ambulatory Visit (HOSPITAL_COMMUNITY): Payer: Self-pay | Admitting: Psychiatry

## 2019-10-16 ENCOUNTER — Telehealth (HOSPITAL_COMMUNITY): Payer: Self-pay | Admitting: *Deleted

## 2019-10-16 VITALS — BP 163/115 | HR 82 | Ht 67.0 in | Wt 224.0 lb

## 2019-10-16 DIAGNOSIS — F3130 Bipolar disorder, current episode depressed, mild or moderate severity, unspecified: Secondary | ICD-10-CM

## 2019-10-16 MED ORDER — BENZTROPINE MESYLATE 1 MG PO TABS: 1.0000 mg | ORAL_TABLET | Freq: Two times a day (BID) | ORAL | 0 refills | Status: DC

## 2019-10-16 MED ORDER — ARIPIPRAZOLE ER 400 MG IM PRSY: 400.0000 mg | PREFILLED_SYRINGE | Freq: Once | INTRAMUSCULAR | Status: DC

## 2019-10-16 MED ORDER — ARIPIPRAZOLE ER 400 MG IM PRSY
400.0000 mg | PREFILLED_SYRINGE | INTRAMUSCULAR | Status: DC
  Administered 2019-10-16 – 2021-03-09 (×14): 400 mg via INTRAMUSCULAR

## 2019-10-16 MED ORDER — ARIPIPRAZOLE ER 400 MG IM SRER: 400.0000 mg | INTRAMUSCULAR | 0 refills | Status: DC

## 2019-10-16 MED ORDER — QUETIAPINE FUMARATE 50 MG PO TABS: 100.0000 mg | ORAL_TABLET | Freq: Every evening | ORAL | 0 refills | Status: DC | PRN

## 2019-10-16 NOTE — Progress Notes (Signed)
In first thing this am. He states he is needing at least a one nights stay downstairs because he is having poor sleep for two days. He feels his speech is rapid and so are his thoughts. His thoughts are organized and he is appropriate. He was able to visit with his son recently for the first time in a long time and he got excited about it. His son has sickle cell and was recently hospitalized in [place] area. He has Seroquel for sleep but hasnt been taking it, states he tries not to because it makes him "tired and sort of out of it" the next day. Seen this am per Toy Cookey NP to assess his concerns and any need for an inpatient stay. She doesn't believe he needs inpatient as he is not SI HI or psychotic. She is giving him meds to help him sleep, believe he is just happy and excited by his recent time with his ex and son. To follow up with NP in two weeks and writer in one month. To call as needed. He is accepting of this plan.injection given in R deltoid of [name] 400 mg without difficulty.

## 2019-10-16 NOTE — Telephone Encounter (Signed)
Provider saw patient and was informed that he takes Seroquel 300 mg PRN at night. Patient was not prescribed Seroquel 300 mg by provider. He notes that it was an old prescription. He notes that the elevated does over sedates him. Provider instructed patient to take Seroquel 100-200 mg nightly. He endorsed understanding and agreed. He denies distractibility, racing thoughts, VAH, grandiosity, liable mood, or delusions. He notes that recently he had an elevated mood after spending time with his son who he had been estranged from for a while. Patient had rapid speech at times but was able to recognize it and slow down when needed. He denies SI/HI. He will follow up with provider in two weeks for re-evaluation.

## 2019-10-16 NOTE — Telephone Encounter (Signed)
In for his scheduled injection of [name], however today he states he feels hyper and like he is becoming manic and believes he needs to stay a night maybe more downstairs and gets some sleep. Complains primarily of poor sleep times two nights. Will have him speak with Ms Doyne Keel NP to assess need for med change and address his concerns.

## 2019-10-24 ENCOUNTER — Encounter (HOSPITAL_COMMUNITY): Payer: Self-pay | Admitting: Psychiatry

## 2019-10-24 ENCOUNTER — Ambulatory Visit (INDEPENDENT_AMBULATORY_CARE_PROVIDER_SITE_OTHER): Payer: Medicare (Managed Care) | Admitting: Psychiatry

## 2019-10-24 ENCOUNTER — Other Ambulatory Visit: Payer: Self-pay

## 2019-10-24 DIAGNOSIS — F3130 Bipolar disorder, current episode depressed, mild or moderate severity, unspecified: Secondary | ICD-10-CM | POA: Diagnosis not present

## 2019-10-24 MED ORDER — BENZTROPINE MESYLATE 1 MG PO TABS: 1.0000 mg | ORAL_TABLET | Freq: Two times a day (BID) | ORAL | 2 refills | Status: DC

## 2019-10-24 MED ORDER — ARIPIPRAZOLE ER 400 MG IM SRER: 400.0000 mg | INTRAMUSCULAR | 11 refills | Status: DC

## 2019-10-24 MED ORDER — LAMOTRIGINE 200 MG PO TABS: 200.0000 mg | ORAL_TABLET | Freq: Every day | ORAL | 2 refills | Status: DC

## 2019-10-24 MED ORDER — QUETIAPINE FUMARATE 100 MG PO TABS: 100.0000 mg | ORAL_TABLET | Freq: Every evening | ORAL | 2 refills | Status: DC | PRN
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
## 2019-11-13 ENCOUNTER — Other Ambulatory Visit: Payer: Self-pay

## 2019-11-13 ENCOUNTER — Ambulatory Visit (INDEPENDENT_AMBULATORY_CARE_PROVIDER_SITE_OTHER): Payer: Medicare (Managed Care) | Admitting: *Deleted

## 2019-11-13 ENCOUNTER — Encounter (HOSPITAL_COMMUNITY): Payer: Self-pay

## 2019-11-13 VITALS — BP 150/91 | HR 87 | Ht 67.0 in | Wt 238.0 lb

## 2019-11-13 DIAGNOSIS — F3130 Bipolar disorder, current episode depressed, mild or moderate severity, unspecified: Secondary | ICD-10-CM

## 2019-11-13 NOTE — Progress Notes (Signed)
In as scheduled for his monthly injection. He is calmer in appearance today and agrees he is doing better. He moved upstairs in his moms house and said that is working out better for him and he is sleeping better. He had been given Seroquel at [facility] per Doyne Keel NP and he took it for awhile but stopped taking it now that he is feeling better. He had a form for me to give to the dr re if he is eligible to give plasma at [facility] and once complete to fax it. He did not follow up with going to social security to apply for special assistance but asked Clinical research associate to remind him about it and Therapist, sports had given me a paper re it to give to patient so he has the printed infor and plans to go over there to apply. Injection of [name] 400 mg IM given in L deltoid today without difficulty. RTC in one month.

## 2019-12-13 ENCOUNTER — Ambulatory Visit (HOSPITAL_COMMUNITY): Payer: Medicare (Managed Care) | Admitting: *Deleted

## 2019-12-13 ENCOUNTER — Other Ambulatory Visit: Payer: Self-pay

## 2019-12-13 ENCOUNTER — Encounter (HOSPITAL_COMMUNITY): Payer: Self-pay

## 2019-12-13 VITALS — BP 131/88 | HR 79 | Ht 67.0 in | Wt 237.0 lb

## 2019-12-13 DIAGNOSIS — F3130 Bipolar disorder, current episode depressed, mild or moderate severity, unspecified: Secondary | ICD-10-CM

## 2019-12-13 NOTE — Progress Notes (Signed)
In this am for his monthly injection. He is verbal, pleasant and appropriate. He was unable to qualify for special assistance per his report when he went to social security and they told him he didn't qualify. He said this am he gets [facility] which I believe is Medicaid. Will try to get clarity on this. He denies any issues with his illness or injection. Today he received [name] 400 mg IM in R deltoid. To return in one month for next injection. Have a resource from a peer to help him find affordable insurance. Provided him the information.

## 2020-01-05 ENCOUNTER — Inpatient Hospital Stay
Admission: EM | Admit: 2020-01-05 | Discharge: 2020-01-17 | Disposition: A | Discharge status: Home / Self Care | Payer: Medicare (Managed Care) | Admitting: Psychiatry

## 2020-01-05 LAB — TSH: TSH: 4.48 u[IU]/mL — ABNORMAL HIGH (ref 0.45–4.12)

## 2020-01-05 LAB — HEPATIC FUNCTION PANEL
ALT: 25 U/L (ref 7–52)
AST: 27 U/L (ref 13–39)
Albumin: 4.7 g/dL (ref 3.5–5.7)
Alkaline Phosphatase: 53 U/L (ref 36–125)
Bilirubin, Direct: 0.12 mg/dL (ref 0.00–0.40)
Bilirubin, Indirect: 0.58 mg/dL (ref 0.00–1.10)
Total Bilirubin: 0.7 mg/dL (ref 0.0–1.5)
Total Protein: 7.4 g/dL (ref 6.4–8.9)

## 2020-01-05 LAB — BASIC METABOLIC PANEL
Anion Gap: 9 mmol/L (ref 3–16)
BUN: 11 mg/dL (ref 7–25)
CO2: 24 mmol/L (ref 21–33)
Calcium: 9.8 mg/dL (ref 8.6–10.3)
Chloride: 102 mmol/L (ref 98–110)
Creatinine: 0.98 mg/dL (ref 0.60–1.30)
Glucose: 125 mg/dL — ABNORMAL HIGH (ref 70–100)
Osmolality, Calculated: 281 mosm/kg (ref 278–305)
Potassium: 3.7 mmol/L (ref 3.5–5.3)
Sodium: 135 mmol/L (ref 133–146)
eGFR AA CKD-EPI: >90 See note.
eGFR NONAA CKD-EPI: >90 See note.

## 2020-01-05 LAB — URINALYSIS W/RFL TO MICROSCOPIC
Bilirubin, UA: NEGATIVE
Blood, UA: NEGATIVE
Glucose, UA: NEGATIVE mg/dL
Ketones, UA: NEGATIVE mg/dL
Leukocyte Esterase, UA: NEGATIVE
Nitrite, UA: NEGATIVE
Protein, UA: NEGATIVE mg/dL
Specific Gravity, UA: 1.006 (ref 1.005–1.035)
Urobilinogen, UA: <2 mg/dL (ref 0.2–1.9)
pH, UA: 6 (ref 5.0–8.0)

## 2020-01-05 LAB — DIFFERENTIAL
Basophils Absolute: 35 /uL (ref 0–200)
Basophils Relative: 0.5 % (ref 0.0–1.0)
Eosinophils Absolute: 105 /uL (ref 15–500)
Eosinophils Relative: 1.5 % (ref 0.0–8.0)
Lymphocytes Absolute: 1862 /uL (ref 850–3900)
Lymphocytes Relative: 26.6 % (ref 15.0–45.0)
Monocytes Absolute: 504 /uL (ref 200–950)
Monocytes Relative: 7.2 % (ref 0.0–12.0)
Neutrophils Absolute: 4494 /uL (ref 1500–7800)
Neutrophils Relative: 64.2 % (ref 40.0–80.0)
nRBC: 0 /100{WBCs} (ref 0–0)

## 2020-01-05 LAB — CBC
Hematocrit: 44.2 % (ref 38.5–50.0)
Hemoglobin: 14.3 g/dL (ref 13.2–17.1)
MCH: 26.4 pg (ref 27.0–33.0)
MCHC: 32.3 g/dL (ref 32.0–36.0)
MCV: 81.7 fL (ref 80.0–100.0)
MPV: 7.1 fL (ref 7.5–11.5)
Platelets: 279 10*3/uL (ref 140–400)
RBC: 5.41 10*6/uL (ref 4.20–5.80)
RDW: 15 % (ref 11.0–15.0)
WBC: 7 10*3/uL (ref 3.8–10.8)

## 2020-01-05 LAB — 2019 NOVEL CORONAVIRUS (COVID-19), NAA-B: SARS-CoV-2: NOT DETECTED

## 2020-01-05 LAB — LIPID PANEL
Cholesterol, Total: 194 mg/dL (ref 0–200)
HDL: 74 mg/dL (ref 60–92)
LDL Cholesterol: 112 mg/dL
Triglycerides: 41 mg/dL (ref 10–149)

## 2020-01-05 LAB — POC MEDTOX DRUG SCREEN, URINE
Amphetamine, 500 ng/mL Cutoff: NEGATIVE
Barbiturates, 200 ng/mL Cutoff: NEGATIVE
Benzodiazepines, 150 ng/mL Cutoff: NEGATIVE
Buprenorphine, 10 ng/mL Cutoff: NEGATIVE
Cocaine metabolite, 150 ng/mL Cutoff: NEGATIVE
Methadone, 200 ng/mL Cutoff: NEGATIVE
Methamphetamine, 500 ng/mL Cutoff: NEGATIVE
Opiates, 100 ng/mL Cutoff: NEGATIVE
Oxycodone, 100 ng/mL Cutoff: NEGATIVE
Phencyclidine, 25 ng/mL Cutoff: NEGATIVE
Propoxyphene, 300 ng/mL Cutoff: NEGATIVE
THC, 50 ng/mL Cutoff: NEGATIVE
Tricyclic Antidepressants, 300 ng/mL Cutoff: NEGATIVE

## 2020-01-05 LAB — VITAMIN B12: Vitamin B-12: 481 pg/mL (ref 180–914)

## 2020-01-05 LAB — VITAMIN D 25 HYDROXY: Vit D, 25-Hydroxy: 23.8 ng/mL — ABNORMAL LOW (ref 30.0–100.0)

## 2020-01-05 LAB — MEDTOX DRUG SCREEN W/ FENTANYL: Fentanyl, 2 ng/mL Cutoff: NEGATIVE

## 2020-01-05 LAB — HEMOGLOBIN A1C: Hemoglobin A1C: 6.1 % (ref 4.0–5.6)

## 2020-01-05 LAB — T4, FREE: Free T4: 0.96 ng/dL (ref 0.61–1.76)

## 2020-01-05 LAB — VALPROIC ACID LEVEL, TOTAL: Valproic Acid Lvl: <10 ug/mL — ABNORMAL LOW (ref 50–100)

## 2020-01-05 MED ORDER — divalproex (DEPAKOTE) DR tablet 500 mg
500 | Freq: Every day | ORAL | Status: AC
Start: 2020-01-05 — End: 2020-01-15
  Administered 2020-01-06 – 2020-01-12 (×3): 500 mg via ORAL

## 2020-01-05 MED ORDER — LORazepam (ATIVAN) injection 2 mg
2 | Freq: Once | INTRAMUSCULAR | Status: AC
Start: 2020-01-05 — End: 2020-01-05
  Administered 2020-01-05: 21:00:00 2 mg via INTRAMUSCULAR

## 2020-01-05 MED ORDER — risperiDONE M-TAB (RISPERDAL M-TABS) disintegrating tablet 1 mg
1 | Freq: Four times a day (QID) | ORAL | Status: AC | PRN
Start: 2020-01-05 — End: 2020-01-06

## 2020-01-05 MED ORDER — ziprasidone (GEODON) injection 20 mg
20 | INTRAMUSCULAR | Status: AC | PRN
Start: 2020-01-05 — End: 2020-01-06
  Administered 2020-01-05: 18:00:00 20 mg via INTRAMUSCULAR

## 2020-01-05 MED ORDER — diphenhydrAMINE (BENADRYL) injection 50 mg
50 | Freq: Once | INTRAMUSCULAR | Status: AC
Start: 2020-01-05 — End: 2020-01-05

## 2020-01-05 MED ORDER — risperiDONE M-TAB (RISPERDAL M-TABS) disintegrating tablet 2 mg
2 | Freq: Two times a day (BID) | ORAL | Status: AC | PRN
Start: 2020-01-05 — End: 2020-01-06
  Administered 2020-01-06: 12:00:00 2 mg via ORAL

## 2020-01-05 MED ORDER — haloperidol lactate (HALDOL) 5 mg/mL injection
5 | INTRAMUSCULAR | Status: AC
Start: 2020-01-05 — End: 2020-01-05
  Administered 2020-01-06: 03:00:00 10 via INTRAMUSCULAR

## 2020-01-05 MED ORDER — haloperidol lactate (HALDOL) injection 10 mg
5 | Freq: Once | INTRAMUSCULAR | Status: AC
Start: 2020-01-05 — End: 2020-01-05

## 2020-01-05 MED ORDER — diphenhydrAMINE (BENADRYL) 50 mg/mL injection
50 | INTRAMUSCULAR | Status: AC
Start: 2020-01-05 — End: 2020-01-05
  Administered 2020-01-05: 21:00:00 50 via INTRAMUSCULAR

## 2020-01-05 MED ORDER — diphenhydrAMINE (BENADRYL) 50 mg/mL injection
50 | INTRAMUSCULAR | Status: AC
Start: 2020-01-05 — End: 2020-01-05
  Administered 2020-01-06: 03:00:00 50 via INTRAMUSCULAR

## 2020-01-05 MED ORDER — acetaminophen (TYLENOL) tablet 650 mg
325 | ORAL | Status: AC | PRN
Start: 2020-01-05 — End: 2020-01-06

## 2020-01-05 MED ORDER — LORazepam (ATIVAN) injection 2 mg
2 | INTRAMUSCULAR | Status: AC | PRN
Start: 2020-01-05 — End: 2020-01-06
  Administered 2020-01-05: 18:00:00 2 mg via INTRAMUSCULAR

## 2020-01-05 MED ORDER — LORazepam (ATIVAN) injection 2 mg
2 | Freq: Once | INTRAMUSCULAR | Status: AC
Start: 2020-01-05 — End: 2020-01-05
  Administered 2020-01-06: 03:00:00 2 mg via INTRAMUSCULAR

## 2020-01-05 MED ORDER — QUEtiapine (SEROQUEL) tablet 50 mg
50 | Freq: Every evening | ORAL | Status: AC
Start: 2020-01-05 — End: 2020-01-07
  Administered 2020-01-07: 01:00:00 50 mg via ORAL

## 2020-01-05 MED ORDER — acetaminophen (TYLENOL) tablet 325 mg
325 | ORAL | Status: AC | PRN
Start: 2020-01-05 — End: 2020-01-06

## 2020-01-05 MED ORDER — haloperidol lactate (HALDOL) 5 mg/mL injection
5 | INTRAMUSCULAR | Status: AC
Start: 2020-01-05 — End: 2020-01-05
  Administered 2020-01-05: 21:00:00 10 via INTRAMUSCULAR

## 2020-01-05 MED ORDER — sterile water (PF) Soln 1.2 mL
INTRAMUSCULAR | Status: AC | PRN
Start: 2020-01-05 — End: 2020-01-06

## 2020-01-05 MED FILL — WATER FOR INJECTION, STERILE INJECTION SOLUTION: 1.20 1.20 mL | INTRAMUSCULAR | Qty: 10

## 2020-01-05 MED FILL — LORAZEPAM 2 MG/ML INJECTION SOLUTION: 2 2 mg/mL | INTRAMUSCULAR | Qty: 1

## 2020-01-05 MED FILL — DIPHENHYDRAMINE 50 MG/ML INJECTION SOLUTION: 50 50 mg/mL | INTRAMUSCULAR | Qty: 1

## 2020-01-05 MED FILL — HALOPERIDOL LACTATE 5 MG/ML INJECTION SOLUTION: 5 5 mg/mL | INTRAMUSCULAR | Qty: 1

## 2020-01-05 MED FILL — HALOPERIDOL LACTATE 5 MG/ML INJECTION SOLUTION: 5 5 mg/mL | INTRAMUSCULAR | Qty: 2

## 2020-01-05 MED FILL — GEODON 20 MG/ML (FINAL CONCENTRATION) INTRAMUSCULAR SOLUTION: 20 20 mg/mL (final conc.) | INTRAMUSCULAR | Qty: 1

## 2020-01-05 NOTE — Unmapped (Signed)
Patient up out of bed and stood in the observation area and urinated.  Very drowsy requiring assistance to ambulate.  Cleaned up woth two staff members.  Escorted back to bed.  About 15 minutes later, up and wide awake.  Crawling on floor, yelling out.  Unable to redirect.  Patient back to bed and placed in 4 point restraints.  MD witnessed.  IM injections of Haldol,Benadryl, and Ativan given.  Will continue to monitor.

## 2020-01-05 NOTE — Unmapped (Signed)
I have approved the emergency use of the PRN order for haloperidol lactate (HALDOL), LORazepam (ATIVAN) and diphenhydramine (BENADRYL)    This decision was made because the patient displayed: Inability to control impulses/behavior, loud, disruptive behavior or yelling  and extremely agitated behavior    Nathaniel Maxwell L. Janayla Marik   01/05/2020  4:15 PM

## 2020-01-05 NOTE — Unmapped (Addendum)
SOCIAL WORK NOTE:    Patient is a 45 y/o BM who was self referred to lobby today for medication.  He reports a history of Bipolar and OCD. Patient has been intrusive, unsafe and manic throughout this writers shift. He is admitted, but due to his behavior, he will likely remain at [facility].     SW will continue to follow.

## 2020-01-05 NOTE — Unmapped (Signed)
Patient is a 45 y/o BM who was self referred to lobby today for medication.  He reports a history of Bipolar and OCD.  He presents A/O with rapid, pressured speech.  He is dressed in shorts and a short sleeve shirt.  States he is from [place] and has been in [place] for two days.  He has a sister who resides here, and is looking to move to [place].  He is here today to get medications.  He is unable to tell me how long he has been off his medications.  I want medications so I can get some sleep.  I have not slept in four days.  He denies SI/HI/VH. States hears voices sometimes, They talk about GOD.  Reports past suicide attempt.  Unable to provide details.    Per Epic patient gets [name] injections.  Received last one on 12/13/19.    Patient currently waiting in the lobby.  Psychiatric evaluation discussed, and patient had no questions.  Water provided.

## 2020-01-05 NOTE — ED Notes (Signed)
Patient was getting increasingly agitated with peer on the unit, yelling at him because he was leaving a mess all over the milieu. Patient was agitated by presence of security and came over to the nurses' station window and punched it. Patient was able to be redirected by male staff, he went to his room, laid on the bed, allowed restraints to be placed on him and took IM medications willingly.

## 2020-01-05 NOTE — Unmapped (Signed)
Patient admitted to observation area.  Changed into hospital garb.  Personal property inventoried and secured per PES protocol.  Oriented to unit.

## 2020-01-05 NOTE — ED Notes (Signed)
Patient requested for water and some graham crackers. Patient speaking sexually inappropriate to RN asking, can I eat that pussy? Patient was redirected. Given fluids and educated on d/c criteria for restraints.

## 2020-01-05 NOTE — Unmapped (Signed)
Edu On  [id number]    Restraint and/or Seclusion Violent and Self-Destructive Assessment    Type of Assessment: Initial    Type of measure used? Restraint    What is patient's immediate situation? Yelling, banging on walls    What was patient's reaction to the intervention? Shaking bed around    What is the patient's medical and behavioral condition? stable    Were less restrictive measures attempted? Yes    Is the need to continue or terminate the restraint or seclusion? initiate

## 2020-01-05 NOTE — Unmapped (Signed)
I have approved the emergency use of the PRN order for ziprasidone (GEODON) and LORazepam (ATIVAN)    This decision was made because the patient displayed: Inability to control impulses/behavior, loud, disruptive behavior or yelling , extremely agitated behavior and threats of harm to others    Bronsyn Shappell L. Dayonna Selbe   01/05/2020  1:20 PM

## 2020-01-05 NOTE — Unmapped (Signed)
Nathaniel Maxwell  [id number]    Restraint and/or Seclusion Violent and Self-Destructive Assessment    Type of Assessment: Initial    Type of measure used? Restraint    What is patient's immediate situation? Punching window in OTA, yelling at security officer    What was patient's reaction to the intervention? calmer    What is the patient's medical and behavioral condition? stable    Were less restrictive measures attempted? Yes    Is the need to continue or terminate the restraint or seclusion? initiate

## 2020-01-05 NOTE — Unmapped (Signed)
Escorted to observation and treatment area for check in process.  All belongings accompanied for inventory and lock up for safekeeping.Marland Kitchen

## 2020-01-05 NOTE — Unmapped (Signed)
Nathaniel Maxwell 45 y.o. male  The reason for medication, risks, benefits, and treatment alternatives of the following medications were discussed with the patient or guardian:   ??? divalproex  500 mg Oral Daily 0900   ??? QUEtiapine  50 mg Oral Nightly (2100)     acetaminophen **OR** acetaminophen, risperiDONE M-TAB **OR** risperiDONE M-TAB, ziprasidone **AND** sterile water  Patient was given the opportunity to ask questions and provided medication specific education as requested. Patient voiced understanding as evidenced by verbalizing reason for medication, risks, and benefits.     Nathaniel Maxwell

## 2020-01-05 NOTE — Unmapped (Signed)
Pt sleeping at start of shift. Awakened during VS assessment. Calm and polite in interaction. VSS. BP elevated. Denied s/sx (blurred vision, headache, or diziness). Hx of HTN. Not taking medications. Pt is intrusive but is able to be verbally redirected. Requested food and fluids and provided.     Vitals:    01/05/20 2030   BP: (!) 160/112   Pulse: 71   Resp: 19   Temp:    SpO2: 100%
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
## 2020-01-05 NOTE — Unmapped (Signed)
Sw called pt's brother, Nathaniel Maxwell, [phone] however this number is to the [employer].  Sw called the brothers other number, [phone].  Maisie Fus saw him yesterday and this morning and he was ok.  He had played board games with the kids.  Pt has no self harming behaviors.  Pt has been diagnosed with bipolar.  Pt just got to [place] yesterday. Maisie Fus states if needed pt can stay with them for awhile. Pt had lived in [place] then moved to [place] and wants to move back to [place].  Pt has no use of drugs/alcohol.  Pt has hx of hospitalization for mental health in [place], [place], [place].  [facility] at [facility].  Pt has issues with getting sleep but takes medications.  Unsure of what meds he should be on though.  For most part when pt is on medications he is compliant with taking them.  Pt had been connected to [facility] for outpatient services when he has lived here in the past.      Family believes pt was on a monthly injection last time he was on something.  Pt does have ability to follow his ques or triggers knowing when he is approaching his threshold.  Sw stated that is what happened today, he knew he was having issues with another patient and requested to be restrained and medicated or he'd go off.  Brother has no immediate safety concerns for him.  Sw stated once he wakes up pt will be evaluated and they will know disposition.

## 2020-01-05 NOTE — Unmapped (Signed)
A few minutes after restraints were removed patient up and out of room and ran down the hallway.  Patient assisted to bathroom.  Ambulates back to bed and is now sleeping.
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
## 2020-01-05 NOTE — Unmapped (Signed)
[facility] Psychiatric Emergency  Service Evaluation    Reason for Visit/Chief Complaint: Manic Behavior        PES Triage Screening:  ABS Score: No data recorded         PSS- Suicide Assessment Score : 1  Scale: 1-2 (Low) ; 3-4 (Medium)   5-6+( High)    Homicide Screen:Over the past 2 weeks, have you had thoughts of killing others?: No, In your lifetime, have you ever attempted to kill others?: No  Suicide Screen: Over the past 2 weeks, have you felt down,depressed, or hopeless?: Yes, Over the past 2 weeks, have you had thoughts of killing yourself?: No    Suicide Assessment: Have you been thinking about how you might kill yourself?: 0, Have you had some intentions of acting on your thoughts?: 0, Have you ever been hospitalized for a mental health or substance use problem?: 1, Has drinking or drug abuse ever been a problem for you? : 0, Is the patient irritable,agitated,or aggressive?: 0, Suicide Assessment Score : 1      Patient History     HPI     Pt says he is here to get a Rx for Seroquel because he hasn't gotten sleep for for 4 days. His speech is pressured and he euphoric. Says he feels agitated and his thoughts are going fast. He does not want any males taking care of him because men make him agitated and he will fuck them up. He does not take any psych meds on a daily basis, but sometimes takes Seroquel for sleep.    Occasionally he breaks out into song, and speaking in tongues. Denies SI/HI. Says he hears voices that tell him to kill people, but he doesn't plan to do that. Says he went to do two different churches today. He loves the Lord, the Nathaniel Maxwell is the only thing that matters to him.     He saw his sister this morning, she lives in [place]. Her # is [phone]. Her husband is Nathaniel Maxwell [phone].    Says he has been hospitalized several times in 2021. He feels like he would like to be admitted to rest up.     Context: nonadherence with meds and progression of illness  Location: Altered  mental status of mood and psychosis  Duration: 4 days.  Severity: severe.  Associated Symptoms: severe insomnia and moderate hyperreligious content.  Modifying Factors: medication noncompliance .    Past Psychiatric History: hx of bipolar disorder    Hospitalizations: yes - pt states he has been hospitalize din [place] multiple times in 2021; most recent [facility] hospiotalization was 08/2018 for bipolar mania with psychtoic features.    Past suicide attempts: no.    History of violence: no.    Substance Use History: denies current.    PMH:       Past Medical History:   Diagnosis Date   ??? Bipolar affective disorder (CMS Dx)    ??? HTN (hypertension)     age 39   ??? Mycosis fungoides (CMS Dx)      I have reviewed the past medical history.  Additional history obtained: yes    Social History:    Social History     Socioeconomic History   ??? Marital status: Legally Separated     Spouse name: None   ??? Number of children: None   ??? Years of education: None   ??? Highest education level: None   Occupational History   ??? None  Tobacco Use   ??? Smoking status: Never Smoker   ??? Smokeless tobacco: Never Used   Substance and Sexual Activity   ??? Alcohol use: Not Currently   ??? Drug use: Not Currently   ??? Sexual activity: Not Currently     Partners: Female   Other Topics Concern   ??? Caffeine Use Yes   ??? Occupational Exposure No   ??? Exercise Yes   ??? Seat Belt Yes   Social History Narrative   ??? None     Social Determinants of Health     Financial Resource Strain: Not on file   Physical Activity: Not on file   Stress: Not on file   Social Connections: Not on file   Housing Stability: Not on file     I have reviewed the past social history.  Additional history obtained: yes.    Family History:    Family History   Problem Relation Age of Onset   ??? Suicidality Father    ??? Hyperlipidemia Mother    ??? Depression Sister    ??? Depression Brother    ??? Depression Maternal Grandfather      I have reviewed the past family history.  Additional history  obtained: yes.    Medications:  Previous Medications    AMLODIPINE (NORVASC) 10 MG TABLET    Take 1 tablet every day for hypertension    ARIPIPRAZOLE ([name] MAINTENA) 400 MG SSRR    Inject 400 mg into the muscle every 30 days.    BENZTROPINE (COGENTIN) 1 MG TABLET    Take 1 tablet (1 mg total) by mouth at bedtime.    DOXAZOSIN (CARDURA) 8 MG TABLET    Take 1 tablet every day at bedtime    HYDROCHLOROTHIAZIDE (HYDRODIURIL) 25 MG TABLET    Take 1 tablet every day for hypertension    LAMOTRIGINE (LAMICTAL) 200 MG TABLET        LORATADINE (CLARITIN) 10 MG TABLET    Take 10 mg by mouth daily.    METFORMIN (GLUCOPHAGE-XR) 500 MG 24 HR TABLET    Take 1 tablet (500 mg total) by mouth daily with breakfast. Indications: type 2 diabetes mellitus    QUETIAPINE (SEROQUEL) 100 MG TABLET    Take 0.5-1 tablets (50-100 mg total) by mouth at bedtime as needed.    SIMVASTATIN (ZOCOR) 40 MG TABLET           Allergies:   Allergies as of 01/05/2020 - Viewed Only 07/17/2019   Allergen Reaction Noted   ??? Topamax [topiramate] Other (See Comments) 11/13/2017   ??? Zyprexa [olanzapine] Other (See Comments) 11/13/2017       Review of Systems     Review of Systems      Physical Exam/Objective Data     ED Triage Vitals [01/05/20 1046]   Vital Signs Group      Temp 97 ??F (36.1 ??C)      Temp Source Oral      Heart Rate 68      Heart Rate Source Automatic      Resp 20      SpO2 96 %      BP (!) 183/120      MAP (mmHg)       BP Location Left arm      BP Method Automatic      Patient Position Sitting   SpO2 96 %   O2 Device        Physical Exam    Mental Status  Exam:     Gait and Muscle Strength:  Normal  Appearance and Behavior: Cooperative, Open Historian and Pacing      Groomed and NL Body Habitus  Speech: pressured speech, normal volume and tone; occasionally breaks out into song or tongues  Language: Naming intact  Mood: great and irritable  Affect: labile, euphoric, irritable  Thought Process and Associations: flight of ideas       No loose  associations  Thought Content: no suicidal/homicidal with plans for the future  Abnormal or psychotic thoughts: Auditory command hallucinations telling him to kill people  Orientation: person, place, time/date and situation  Memory: recent, remote, and immediate recall intact  Attention and Concentration: intact  Abstraction: not assessed  Fund of Knowledge: average  Insight and Judgement: Partial     Fair        Labs:    Please see electronic medical record for any tests performed in the ED.    No results found for this or any previous visit (from the past 24 hour(s)).    Radiology and EKG:  No results found.    EKG: Please see electronic medical record for any studies performed in the ED.    Emergency Course and Plan     Nathaniel Maxwell is a 45 y.o. male who presented to the emergency department with Manic Behavior    Pt with hx of bipolar disorder exhibiting signs and symptoms of mania, including no sleep for 4 days, intense euphoria and irritability, racing thoughts, pressured speech. He is also demonstrating hyperreligious thoughts, has gone to church twice today and plans to go two more times today; singing hymns and speaking in tongues in the middle of the interview. And endorsing AH of voices telling him to kill people. Likely decomp bipolar, given hx of such, and no major substance hx. Is not taking his psych meds. Has been on combo of Depakote and Seroquel in the past. Will restart Depakote now at 500 mg daily for stabilization of acute mania, and Seroquel 50 mg qhs to aid with sleep. Will allow inpatient team to titrate meds.    Diagnosis:    Primary psychiatric Diagnosis: Bipolar disorder, currently manic with psychotic features  Other psychiatric Diagnoses:   Substance Use Diagnoses:   Medical Diagnoses:       Disposition:      Admit.  Patient requires inpatient admission due to  mental illness with a demonstrated need for inpatient treatment as least restrictive means.   Summary of rationale for  disposition: see above.    Provider completing note: Resident, supervised by Dr. Carroll Sage.    Patient was in [place].     Patient had a completed Statement of Belief during this encounter:yes  , put on a 72 hour hold.    Medications given in PES: yes.  Medications prescribed for home or inpatient use: yes.  Laboratory work ordered: yes.  Other diagnostic studies ordered: no.  Old and/or outside medical records reviewed: yes.  Collateral information contacted: yes.  Patient's outside provider contacted: no.         Emmily Pellegrin L. Chales Salmon, MD  Resident  01/05/20 1226

## 2020-01-06 MED ORDER — metFORMIN (GLUCOPHAGE-XR) 24 hr tablet 500 mg
500 | Freq: Every day | ORAL | Status: AC
Start: 2020-01-06 — End: 2020-01-17
  Administered 2020-01-10 – 2020-01-17 (×8): 500 mg via ORAL

## 2020-01-06 MED ORDER — sterile water (PF) Soln 1.2 mL
INTRAMUSCULAR | Status: AC | PRN
Start: 2020-01-06 — End: 2020-01-17

## 2020-01-06 MED ORDER — haloperidol lactate (HALDOL) 5 mg/mL injection
5 | INTRAMUSCULAR | Status: AC
Start: 2020-01-06 — End: 2020-01-06
  Administered 2020-01-06: 07:00:00 10 via INTRAMUSCULAR

## 2020-01-06 MED ORDER — loratadine (CLARITIN) tablet 10 mg
10 | Freq: Every day | ORAL | Status: AC
Start: 2020-01-06 — End: 2020-01-17
  Administered 2020-01-07 – 2020-01-17 (×11): 10 mg via ORAL

## 2020-01-06 MED ORDER — haloperidol lactate (HALDOL) injection 10 mg
5 | Freq: Once | INTRAMUSCULAR | Status: AC
Start: 2020-01-06 — End: 2020-01-06

## 2020-01-06 MED ORDER — LORazepam (ATIVAN) injection 2 mg
2 | Freq: Once | INTRAMUSCULAR | Status: AC
Start: 2020-01-06 — End: 2020-01-06
  Administered 2020-01-06: 19:00:00 2 mg via INTRAMUSCULAR

## 2020-01-06 MED ORDER — acetaminophen (TYLENOL) tablet 325 mg
325 | ORAL | Status: AC | PRN
Start: 2020-01-06 — End: 2020-01-17

## 2020-01-06 MED ORDER — magnesium hydroxide (MILK OF MAGNESIA) 2,400 mg/10 mL oral suspension 10 mL
2400 | Freq: Two times a day (BID) | ORAL | Status: AC | PRN
Start: 2020-01-06 — End: 2020-01-17
  Administered 2020-01-08 – 2020-01-13 (×3): 10 mL via ORAL

## 2020-01-06 MED ORDER — chlorproMAZINE (THORAZINE) injection 50 mg
25 | Freq: Once | INTRAMUSCULAR | Status: AC
Start: 2020-01-06 — End: 2020-01-06
  Administered 2020-01-06: 19:00:00 50 mg via INTRAMUSCULAR

## 2020-01-06 MED ORDER — diphenhydrAMINE (BENADRYL) 50 mg/mL injection
50 | INTRAMUSCULAR | Status: AC
Start: 2020-01-06 — End: 2020-01-06
  Administered 2020-01-06: 07:00:00 50 via INTRAMUSCULAR

## 2020-01-06 MED ORDER — hydroCHLOROthiazide (HYDRODIURIL) tablet 25 mg
25 | Freq: Every day | ORAL | Status: AC
Start: 2020-01-06 — End: 2020-01-16
  Administered 2020-01-07 – 2020-01-16 (×9): 25 mg via ORAL

## 2020-01-06 MED ORDER — LORazepam (ATIVAN) injection 2 mg
2 | Freq: Once | INTRAMUSCULAR | Status: AC
Start: 2020-01-06 — End: 2020-01-06
  Administered 2020-01-06: 07:00:00 2 mg via INTRAMUSCULAR

## 2020-01-06 MED ORDER — divalproex (DEPAKOTE) DR tablet 500 mg
500 | Freq: Once | ORAL | Status: AC
Start: 2020-01-06 — End: 2020-01-06
  Administered 2020-01-06: 17:00:00 500 mg via ORAL

## 2020-01-06 MED ORDER — risperiDONE M-TAB (RISPERDAL M-TABS) disintegrating tablet 2 mg
2 | Freq: Two times a day (BID) | ORAL | Status: AC | PRN
Start: 2020-01-06 — End: 2020-01-17
  Administered 2020-01-07 – 2020-01-12 (×2): 2 mg via ORAL

## 2020-01-06 MED ORDER — atorvastatin (LIPITOR) tablet 20 mg
20 | Freq: Every evening | ORAL | Status: AC
Start: 2020-01-06 — End: 2020-01-17
  Administered 2020-01-07 – 2020-01-16 (×8): 20 mg via ORAL

## 2020-01-06 MED ORDER — hydroCHLOROthiazide (HYDRODIURIL) tablet 25 mg
25 | Freq: Once | ORAL | Status: AC
Start: 2020-01-06 — End: 2020-01-06
  Administered 2020-01-07: 01:00:00 25 mg via ORAL

## 2020-01-06 MED ORDER — acetaminophen (TYLENOL) tablet 650 mg
325 | ORAL | Status: AC | PRN
Start: 2020-01-06 — End: 2020-01-17
  Administered 2020-01-07 – 2020-01-08 (×2): 650 mg via ORAL

## 2020-01-06 MED ORDER — LORazepam (ATIVAN) injection 2 mg
2 | INTRAMUSCULAR | Status: AC | PRN
Start: 2020-01-06 — End: 2020-01-17

## 2020-01-06 MED ORDER — diphenhydrAMINE (BENADRYL) injection 50 mg
50 | Freq: Once | INTRAMUSCULAR | Status: AC
Start: 2020-01-06 — End: 2020-01-06

## 2020-01-06 MED ORDER — amLODIPine (NORVASC) tablet 10 mg
10 | Freq: Every day | ORAL | Status: AC
Start: 2020-01-06 — End: 2020-01-17
  Administered 2020-01-07 – 2020-01-17 (×11): 10 mg via ORAL

## 2020-01-06 MED ORDER — LORazepam (ATIVAN) tablet 1 mg
1 | Freq: Once | ORAL | Status: AC
Start: 2020-01-06 — End: 2020-01-06
  Administered 2020-01-06: 17:00:00 1 mg via ORAL

## 2020-01-06 MED ORDER — ziprasidone (GEODON) injection 20 mg
20 | INTRAMUSCULAR | Status: AC | PRN
Start: 2020-01-06 — End: 2020-01-17

## 2020-01-06 MED ORDER — risperiDONE M-TAB (RISPERDAL M-TABS) disintegrating tablet 1 mg
1 | Freq: Four times a day (QID) | ORAL | Status: AC | PRN
Start: 2020-01-06 — End: 2020-01-17

## 2020-01-06 MED ORDER — ARIPiprazole (ABILIFY) tablet 10 mg
10 | Freq: Every day | ORAL | Status: AC
Start: 2020-01-06 — End: 2020-01-07

## 2020-01-06 MED ORDER — terazosin (HYTRIN) capsule 10 mg
5 | Freq: Every evening | ORAL | Status: AC
Start: 2020-01-06 — End: 2020-01-16
  Administered 2020-01-08 – 2020-01-16 (×8): 10 mg via ORAL

## 2020-01-06 MED ORDER — amLODIPine (NORVASC) tablet 10 mg
10 | Freq: Once | ORAL | Status: AC
Start: 2020-01-06 — End: 2020-01-06
  Administered 2020-01-06: 20:00:00 10 mg via ORAL

## 2020-01-06 MED ORDER — LORazepam (ATIVAN) tablet 1 mg
1 | ORAL | Status: AC | PRN
Start: 2020-01-06 — End: 2020-01-16
  Administered 2020-01-07 – 2020-01-16 (×9): 1 mg via ORAL

## 2020-01-06 MED ORDER — benztropine (COGENTIN) tablet 1 mg
1 | Freq: Every evening | ORAL | Status: AC
Start: 2020-01-06 — End: 2020-01-17
  Administered 2020-01-07 – 2020-01-17 (×10): 1 mg via ORAL

## 2020-01-06 MED FILL — DEPAKOTE 500 MG TABLET,DELAYED RELEASE: 500 500 mg | ORAL | Qty: 1

## 2020-01-06 MED FILL — LORAZEPAM 1 MG TABLET: 1 1 MG | ORAL | Qty: 1

## 2020-01-06 MED FILL — HYDROCHLOROTHIAZIDE 25 MG TABLET: 25 25 MG | ORAL | Qty: 1

## 2020-01-06 MED FILL — TERAZOSIN 5 MG CAPSULE: 5 5 MG | ORAL | Qty: 2

## 2020-01-06 MED FILL — RISPERIDONE 2 MG DISINTEGRATING TABLET: 2 2 MG | ORAL | Qty: 1

## 2020-01-06 MED FILL — LORAZEPAM 2 MG/ML INJECTION SOLUTION: 2 2 mg/mL | INTRAMUSCULAR | Qty: 1

## 2020-01-06 MED FILL — HALOPERIDOL LACTATE 5 MG/ML INJECTION SOLUTION: 5 5 mg/mL | INTRAMUSCULAR | Qty: 1

## 2020-01-06 MED FILL — CHLORPROMAZINE 25 MG/ML INJECTION SOLUTION: 25 25 mg/mL | INTRAMUSCULAR | Qty: 2

## 2020-01-06 MED FILL — DIPHENHYDRAMINE 50 MG/ML INJECTION SOLUTION: 50 50 mg/mL | INTRAMUSCULAR | Qty: 1

## 2020-01-06 MED FILL — AMLODIPINE 10 MG TABLET: 10 10 MG | ORAL | Qty: 1

## 2020-01-06 NOTE — Unmapped (Signed)
Report given to [employer] on RW5. All questions answered and contact information left in the case of additional concerns.

## 2020-01-06 NOTE — Unmapped (Signed)
Patient is talking on the phone at this time. No distress noted. Safety checks maintained.

## 2020-01-06 NOTE — Unmapped (Addendum)
Day Shift Nursing Note (1820-2300)    Nathaniel Maxwell DOB 08-Feb-1975  LOS 0  1. Bipolar I disorder with mania (CMS Dx)       Patient out on unit watching bengals/steelers game. Independent with activities of daily living, and practicing adequate hygiene routine. Compliant with scheduled medication. PRN medication administered this shift; 2016 - Ativan 1 mg, Risperidone 2 mg. Both effective. At 2251 Ativan 1 mg administered per POD, [name], DO for pt request of something to help me sleep. Effectiveness pending. No unsafe behavior observed. No complaints voiced. Safety maintained per unit protocol.     Scheduled Medications  ??? [START ON 01/07/2020] amLODIPine  10 mg Oral Daily 0900   ??? [START ON 01/07/2020] ARIPiprazole  10 mg Oral Daily 0900   ??? atorvastatin  20 mg Oral Nightly (2100)   ??? benztropine  1 mg Oral Nightly (2100)   ??? divalproex  500 mg Oral Daily 0900   ??? [START ON 01/07/2020] hydroCHLOROthiazide  25 mg Oral Daily 0900   ??? [START ON 01/07/2020] loratadine  10 mg Oral Daily 0900   ??? [START ON 01/07/2020] metFORMIN  500 mg Oral Daily with breakfast   ??? QUEtiapine  50 mg Oral Nightly (2100)   ??? terazosin  10 mg Oral Nightly (2100)       Vitals:    01/06/20 1433 01/06/20 1504 01/06/20 1702 01/06/20 2015   BP: 138/90 (!) 182/116 (!) 164/104 (!) 176/121   BP Location: Right arm  Left arm Right arm   Patient Position: Sitting  Lying Sitting   Pulse: 82  71 109   Resp: 16  14 16    Temp: 98.6 ??F (37 ??C)      TempSrc: Oral      SpO2: 100%  98% 100%
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
## 2020-01-06 NOTE — Unmapped (Signed)
Attempted x 2 to call report. Per receiving RN, room has not been cleaned since last patient was moved.

## 2020-01-06 NOTE — Unmapped (Signed)
Patient updated on plan of care and informed of any delays. No concerns voiced at this time. The patient was advised to notify staff if any concerns or questions.

## 2020-01-06 NOTE — Unmapped (Addendum)
2 - Admission Note    Patient is a 45 y.o. year old Black or African American male admitted to [address] from PES with diagnosis   1. Bipolar I disorder with mania (CMS Dx)         HX copied from PES notes of 01/09/2020 Dr. Chales Salmon.   Pt says he is here to get a Rx for Seroquel because he hasn't gotten sleep for for 4 days. His speech is pressured and he euphoric. Says he feels agitated and his thoughts are going fast. He does not want any males taking care of him because men make him agitated and he will fuck them up. He does not take any psych meds on a daily basis, but sometimes takes Seroquel for sleep.  ??  Occasionally he breaks out into song, and speaking in tongues. Denies SI/HI. Says he hears voices that tell him to kill people, but he doesn't plan to do that. Says he went to do two different churches today. He loves the Lord, the Shaune Pollack is the only thing that matters to him.   ??  He saw his sister this morning, she lives in [place]. Her # is [phone]. Her husband is Mellissa Kohut III [phone].  ??  Says he has been hospitalized several times in 2021. He feels like he would like to be admitted to rest up.    Patient Active Problem List   Diagnosis   ??? Essential hypertension   ??? Seasonal allergic rhinitis due to pollen   ??? Type 2 diabetes mellitus without complication, without long-term current use of insulin (CMS Dx)   ??? Chronic pain of left knee   ??? Bipolar affective disorder, manic, severe (CMS Dx)   ??? Encounter for long-term current use of high risk medication   ??? History of mycosis fungoides         Past Medical History:   Diagnosis Date   ??? Bipolar affective disorder (CMS Dx)    ??? HTN (hypertension)     age 26   ??? Mycosis fungoides (CMS Dx)        Patient presents upon admission to unit denying suicial/homicidal ideation and auditory/visual hallucinations.     Patient oriented to room, and unit. Belongings inventoried, and secured. Given patient rights and safety packet. Patient  Opted to sign notification form. Safety maintained per unit protocol.

## 2020-01-06 NOTE — Unmapped (Addendum)
Risperdal m tab 2 given per PRN order. Pending results. Restraints discontinued

## 2020-01-06 NOTE — Unmapped (Signed)
Problem: Major Thought Disorder  Goal: DC Criteria-Thought D/O won't neg. affect daily functioning  Outcome: Not Progressing     Problem: Major Mood Disorder  Goal: DC Criteria-Mania/depression won't neg affect daily function  Outcome: Not Progressing     Problem: Potential for Harm to Self or Others  Goal: DC Criteria- Patient will not present active danger to self  Description: patient will not present an active danger to themselves due to a psych  illness  Outcome: Not Progressing  Goal: DC Criteria-Pt will not present an active danger to others  Outcome: Not Progressing     Problem: Other medical condition  Goal: DC Criteria-Pt will stay medically stable for F/u in OP  Outcome: Not Progressing     Problem: Unable to care for basic needs  Goal: DC Criteria-Pt will utilize gestures to indicate wants/needs  Outcome: Not Progressing

## 2020-01-06 NOTE — Unmapped (Addendum)
Pt frequently knocking on window at nursing station, unable to follow verbal redirection. Became increasingly agitated, began yelling at other pts and unable to be calmed. Given ordered IM injections. Will monitor per protocol.

## 2020-01-06 NOTE — Unmapped (Signed)
Deitrick Ferreri  [phone]    Restraint and/or Seclusion Violent and Self-Destructive Assessment    Type of Assessment: Re-assessment    Type of measure used? Restraint    What is patient's immediate situation? Patient des escalated to 2 point restraints and is asleep however starting to reescalate in behaviors when provoked    What was patient's reaction to the intervention? Asleep     What is the patient's medical and behavioral condition? Hx of psychosis     Were less restrictive measures attempted? Yes    Is the need to continue or terminate the restraint or seclusion? Continue

## 2020-01-06 NOTE — Unmapped (Signed)
Can given additional 1 mg of ativan PO for anxiety and sleep. He had already received 1 mg PO at 8 PM. As this individual has required a lot of medication down in PES with limited response    Teyanna Thielman
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
## 2020-01-06 NOTE — Unmapped (Signed)
[facility] PES   Observation Progress Note      Subjective     Nathaniel Maxwell has been admitted to PES observation for 24 hours.  Clinical progress includes has required restraints several times while here and emergent medications.    This morning, Nathaniel Maxwell has been pacing, up to nurses station a lot, but has not been aggressive. He carries a hand written paper stating AMA and said he wants to leave the hospital. He cites cost as a primary concern, said he was hospitalized here for 3 weeks and had large hospital bill. I observed him help another patient with using the phone and to encourage him to take care of his hygiene. He tells me he is feeling okay but tired (likely a medication side effect).      Mental Status Examination / Objective Data     Vital signs:     Gait and Muscle Strength:  Normal  Appearance and Behavior: Cooperative, Open Historian and Pacing                                                  Groomed and NL Body Habitus  Speech: pressured speech, normal volume and tone   Language: Naming intact  Mood: great  Affect:  euphoric  Thought Process and Associations: flight of ideas                                                              No loose associations  Thought Content: no suicidal/homicidal with plans for the future  Abnormal or psychotic thoughts: RTIS   Orientation: person, place, time/date and situation  Memory: recent, remote, and immediate recall intact  Attention and Concentration: intact  Abstraction: not assessed  Fund of Knowledge: average  Insight and Judgement: Partial                                      Fair         Assessment      Nathaniel Maxwell continues to be managed in accordance with PES clinical guidelines for observation.  An update of his clinical problem list includes:    1. Bipolar disorder, manic with psychotic features     Plan      1. Gave another dose of Depakote this morning, Ativan, to treat symptoms and also hopefully prevent him from needing further restraint or  emergent medication.  2. Will continue plan for admission. Explained to patient and answered his questions regarding civil commitment process.    Lashan Macias P Jadwiga Faidley  01/06/2020  10:20 AM           Meredith Staggers, MD  01/06/20 1024

## 2020-01-06 NOTE — Unmapped (Signed)
Patient is visible, intrusive, labile, and is frequently making requests. Patient denies SI/HI and AVH. Patient stated  I'm just manic. Re-assurance and support offered. Food and fluids provided. Vital signs obtained and WDL's for this patient. No distress noted. Safety checks maintained.

## 2020-01-07 MED ORDER — QUEtiapine (SEROQUEL) tablet 50 mg
50 | Freq: Two times a day (BID) | ORAL | Status: AC
Start: 2020-01-07 — End: 2020-01-09
  Administered 2020-01-08 – 2020-01-09 (×3): 50 mg via ORAL

## 2020-01-07 MED FILL — AMLODIPINE 10 MG TABLET: 10 10 MG | ORAL | Qty: 1

## 2020-01-07 MED FILL — BENZTROPINE 1 MG TABLET: 1 1 MG | ORAL | Qty: 1

## 2020-01-07 MED FILL — METFORMIN ER 500 MG TABLET,EXTENDED RELEASE 24 HR: 500 500 MG | ORAL | Qty: 1

## 2020-01-07 MED FILL — TERAZOSIN 5 MG CAPSULE: 5 5 MG | ORAL | Qty: 2

## 2020-01-07 MED FILL — TYLENOL 325 MG TABLET: 325 325 mg | ORAL | Qty: 2

## 2020-01-07 MED FILL — HYDROCHLOROTHIAZIDE 25 MG TABLET: 25 25 MG | ORAL | Qty: 1

## 2020-01-07 MED FILL — ATORVASTATIN 20 MG TABLET: 20 20 MG | ORAL | Qty: 1

## 2020-01-07 MED FILL — LORATADINE 10 MG TABLET: 10 10 mg | ORAL | Qty: 1

## 2020-01-07 MED FILL — SEROQUEL 50 MG TABLET: 50 50 mg | ORAL | Qty: 1

## 2020-01-07 MED FILL — DEPAKOTE 500 MG TABLET,DELAYED RELEASE: 500 500 mg | ORAL | Qty: 1

## 2020-01-07 MED FILL — ARIPIPRAZOLE 10 MG TABLET: 10 10 MG | ORAL | Qty: 1

## 2020-01-07 NOTE — Unmapped (Signed)
Assumed care at 2330. Patient in room, no issues noted. He slept off and on, was in/out of room. Very needy, asking for different things. Limits set and redirected. He was visible on the unit this am, after 0600, socializing and playing cards with male peer, SB. He c/o headache. PRN Tylenol 650 mg administered at 0533. Medication effective. Safety checks in place. Safety maintained.

## 2020-01-07 NOTE — Unmapped (Signed)
Involuntary Medications Petition of the Court    01/07/2020    Patient: Nathaniel Maxwell    Risperidone 2 to 10mg  by mouth daily  Risperidone Consta 25 to 50mg  by injection every 2 weeks  Olanzapine 10mg  to 40mg  by injection or 10 to 40mg  by mouth daily  Paliperidone 3 to 12mg  by mouth daily  Paliperidone Palmitate 234mg  by injection on day 1, 156mg  by injection on day 8, and     234 mg by injection on day 30, and monthly thereafter  Haloperidol 10 to 40 mg by injection or by mouth daily  Haloperidol Decanoate 50 to 200mg  by injection every 3 to 4 weeks  Fluphenazine 1 to 40mg  by injection or 1 to 40mg  by mouth daily  Fluphenazine Decanoate 12.5 to 100mg  by injection every 2 to 6 weeks  Chlorpromazine 10 to 1800mg  po/IM daily  Cogentin 0.5 to 6 mg by mouth daily  Benadryl 12.5 to 100 mg by mouth daily  [name] up to 30 mg po/IM q day  [name] Maintena up to 400 mg IM q 4 weeks  [name] Aristada up to 882 mg IM q 4 weeks  [name] Aristada Initio up 675 mg one time              In addition to antipsychotic medications, it may also be necessary to treat [name] for periodic agitation. With the exception of injectable lorazepam and injectable olanzepine used together, the following medications are requested:     1) Lorazepam 1 to 10mg  by injection or by mouth daily  2) Clonazepam 0.5 to 6mg  by mouth daily     If given these medicines,[name] would be monitored appropriately by the medical staff at [facility] for potentially harmful side effects. I do believe the benefits of treatment with the above medications outweigh the potential risks at this time.
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
## 2020-01-07 NOTE — Unmapped (Signed)
Occupational Therapy/Therapeutic Recreation Group Note      Group: Network engineer: Attended  25/40 minutes  Level of Participation: Active, Interacted with peer(s), Interacted with staff, Initiated Comment(s) and Followed Directions    Pt on phone at time of gathering for pm group session. Pt agreeable to attend. Stated he would join when he was done. Pt hardly able to keep his eyes open during session. Able to participate in Name 5 and offer appropriate responses when it was his turn. Requested to listen to music to keep me awake. Pt excused self briefly and returned during seated exercises. Pt completed exercises but was quite drowsy. Reported decreased sleep over last several days. Pt encouraged to return to his room and get some needed rest. Pt calm and cooperative.
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
## 2020-01-07 NOTE — Unmapped (Addendum)
Problem: Major Mood Disorder  Goal: DC Criteria-Mania/depression won't neg affect daily function  Description: Social work will assess and follow patient during this admission.  Note:     Social worker completed initial inpatient social work assessment.

## 2020-01-07 NOTE — Unmapped (Signed)
Patient having a hard time staying awake  Drowsy on unit  States not sleeping for days and encouraged to rest  Wanting a shot to help sleep at bedtime  Informed on scheduled medications (Seroquel) to be given at bedtime to help assist and IM injections not being protocol to help with routine sleep  Voiced understanding  Is calm and cooperative this shift  Wanting and able to shave while being monitored for safety  No behavioral issues  Discussed probate court process and not being in any legal trouble due to patient wanting to go online and practice for his case  Voiced understanding and appreciation  Will continue to monitor

## 2020-01-07 NOTE — Unmapped (Signed)
Occupational Therapy/Therapeutic Recreation Group Note      Group: Task  Attendance: Attended  25/60 minutes  Level of Participation: Active, Interacted with peer(s), Interacted with staff, Initiated Comment(s) and Followed Directions    Pt wanted to readily attend am group session at start. Pt reminded this writer need to meet with him to complete assessment first before putting on group schedule. Pt voiced understanding. After other pt's in group session were set up, this writer met with pt for initial assessment. Pt was brought into group room. Pt used headphones and requested Chrisitian station. Pt selected word search activity. Pt asked writer to time me. Pt was trying hard to focus on activity but got easily distracted and switched from CD to radio and reported the CD was going to put him to sleep so he needed to stay on the radio. Pt remained calm and appropriate while in session. Stated on a few occassions he needed to slow down and rethink that thought.

## 2020-01-07 NOTE — Unmapped (Signed)
Problem: Major Mood Disorder  Goal: DC Criteria-Mania/depression won't neg affect daily function  Outcome: Progressing  Patient has been polite in conversation  Can be intrusive at times but does follow redirection of staff  Denies any thoughts to harm self or others  States goal in being here is to get improved sleep and wanting seroquel  Refused abilify I only take the injection, metformin and Depakote.  Was receptive to taking blood pressure medication  Blood pressure 179/117  Dr. Alvester Morin aware  Will continue to monitor  Voiced understanding of probate court scheduled for this Thursday and plans on attending  Encouragement and reassurance given

## 2020-01-07 NOTE — Unmapped (Addendum)
[phone]  Pt highly visible, interactive with staff and peers, verbal, cooperative and needy. Told Clinical research associate, I came to the hospital because God asked me to come. Reluctant to take HS med saying, I'm not taking all that, but if it's you, I will take.Noted with high BP. Given scheduled Terazosin 10 mg. Denies SI/HI but endorses hearing voices saying, telling me to stop everyone coming towards you. Support offered. Asking every male staff if they are married. Safety checks in place.

## 2020-01-07 NOTE — Unmapped (Signed)
Needs meds restarted, labs medical stability on hold, likley need probate
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
## 2020-01-08 LAB — AMMONIA: Ammonia: 71 ug/dL (ref 27–90)

## 2020-01-08 LAB — CYTOCHROME  P450 2D6/2C19 GENOTYPING

## 2020-01-08 LAB — HEMOGLOBIN A1C: Hemoglobin A1C: 6.3 % (ref 4.0–5.6)

## 2020-01-08 MED ORDER — lisinopriL (PRINIVIL) tablet 10 mg
10 | Freq: Every day | ORAL | Status: AC
Start: 2020-01-08 — End: 2020-01-09

## 2020-01-08 MED FILL — HYDROCHLOROTHIAZIDE 25 MG TABLET: 25 25 MG | ORAL | Qty: 1

## 2020-01-08 MED FILL — BENZTROPINE 1 MG TABLET: 1 1 MG | ORAL | Qty: 1

## 2020-01-08 MED FILL — MILK OF MAGNESIA CONCENTRATED 2,400 MG/10 ML ORAL SUSPENSION: 2400 2,400 mg/10 mL | ORAL | Qty: 10

## 2020-01-08 MED FILL — SEROQUEL 50 MG TABLET: 50 50 mg | ORAL | Qty: 1

## 2020-01-08 MED FILL — LORATADINE 10 MG TABLET: 10 10 mg | ORAL | Qty: 1

## 2020-01-08 MED FILL — ATORVASTATIN 20 MG TABLET: 20 20 MG | ORAL | Qty: 1

## 2020-01-08 MED FILL — AMLODIPINE 10 MG TABLET: 10 10 MG | ORAL | Qty: 1

## 2020-01-08 MED FILL — METFORMIN ER 500 MG TABLET,EXTENDED RELEASE 24 HR: 500 500 MG | ORAL | Qty: 1

## 2020-01-08 MED FILL — LORAZEPAM 1 MG TABLET: 1 1 MG | ORAL | Qty: 1

## 2020-01-08 MED FILL — TYLENOL 325 MG TABLET: 325 325 mg | ORAL | Qty: 2

## 2020-01-08 MED FILL — LISINOPRIL 10 MG TABLET: 10 10 MG | ORAL | Qty: 1

## 2020-01-08 MED FILL — TERAZOSIN 5 MG CAPSULE: 5 5 MG | ORAL | Qty: 2

## 2020-01-08 MED FILL — DEPAKOTE 500 MG TABLET,DELAYED RELEASE: 500 500 mg | ORAL | Qty: 1

## 2020-01-08 NOTE — Unmapped (Signed)
[facility] Psychiatry  Subsequent Day Note    Nathaniel Maxwell  [id number]    Interval History:  One day    Hospital Day: 2    Chief Complaint: Still very angry and difficult    Updates to Psych/Family/Substance/Medical/Social History:  Patient remains very angry and showing poor boundaries.  He is hyperverbal and pressured.  But at the same time, he is very needy and expects his needs to be addressed at that same time immediately.  He demands all sorts of things such as towels to take a shower. He has difficulty seeing that taking a shower might awake the other patients and he does not accept that. He can only think of his own needs.      He is focused on male staff and asks them if they are married.      Even though he is very needy, he certainly does not put himself in my hands and he is wanting to dictate his treatment.  I discussed with him  that we would go to court to discuss when he could be discharged and to discuss forced meds. He has been refusing some meds and taking nighttime Seroquel but only in limited quantities which is not sufficient. And when it comes to court, he wants to be his own attorney. Therefore he wants to be in charge of all the things that affect his life when it would clearly be better for him to listen to the professionals.      This is a psychosis unspecified of severe nature. Context is non adherence and duration is 1 week.            Medical Review Of Systems:  Respiratory: negative  Cardiovascular: negative  Gastrointestinal: negative  Musculoskeletal:negative    Objective:  Vitals:  Vital signs in last 24 hours:  Temp:  [96.8 ??F (36 ??C)] 96.8 ??F (36 ??C)  Heart Rate:  [122] 122  Resp:  [18] 18  BP: (163-179)/(110-117) 163/110      Labs:  No results found for this or any previous visit (from the past 48 hour(s)).    Scheduled Meds:   ??? amLODIPine  10 mg Oral Daily 0900   ??? atorvastatin  20 mg Oral Nightly (2100)   ??? benztropine  1 mg Oral Nightly (2100)   ??? divalproex  500 mg  Oral Daily 0900   ??? hydroCHLOROthiazide  25 mg Oral Daily 0900   ??? lisinopriL  10 mg Oral Daily 0900   ??? loratadine  10 mg Oral Daily 0900   ??? metFORMIN  500 mg Oral Daily with breakfast   ??? QUEtiapine  50 mg Oral BID   ??? terazosin  10 mg Oral Nightly (2100)           Mental Status Evaluation:  Appearance:  disheveled   Behavior:  agitated   Speech:  hyperverbal.   Mood:  angry   Affect:  increased in intensity   Thought Process:  circumstantial   Thought Content:  Preoccupied with the nurses and asking if they are available as partners.     Sensorium:  person, place and time/date   Memory: intact   Cognition:  grossly intact   Insight:  limited   Judgment:  limited     Assessment and Plan:                 AXIS I: psychosis unspecified.     AXIS II: Deferred     AXIS III: HTN.  I have lisinopril. BP is very high.      Assessment: Patient hyperverbal, needy and demanding.  Hypersexual also. Still dictating his treatment.      Plan: Go to court tomorrow to discuss his length and stay and for forced meds.      Benetta Spar  01/08/2020

## 2020-01-08 NOTE — Unmapped (Signed)
Patient's sister Ousman Dise was given the patient's car key during her visit.

## 2020-01-08 NOTE — Unmapped (Signed)
Occupational Therapy/Therapeutic Recreation Group Note      Group: Customer service manager  Attendance: Attended  45/45 minutes  Level of Participation: Moderate, Interacted with peer(s), Interacted with staff and Followed Directions    Pt readily attended group this PM in rec room, appropriate ADLs, constricted affect but euthymic when engaged in activities. Pt played corn hole, pop a shot and novel cognitive game; min disorganization noted in conversation and interactions but calm and cooperative throughout. Pt is difficult to understand at times, and closing his eyes some when sitting. Pt patient with peer who struggled to learn novel game. Returned to [employer] without issue.

## 2020-01-08 NOTE — Unmapped (Signed)
Assumed care at 1930. Patient in room, met with him there. Polite, calm and cooperative during interaction. No complaints voiced. Compliant with scheduled medications. No PRN medications administered. Denies suicidal/homicidal ideations and auditory/visual hallucinations. No delusions or paranoid ideations shared. No behavioral concerns. Po intake good. Continent of bowel and bladder with no complaints. Encouraged to notify staff of any needs/concerns. Safety checks in place. Safety maintained.

## 2020-01-08 NOTE — Unmapped (Addendum)
[id number]  Pt visible on unit at start of shift. In bed resting. Awaken early at 0230, restless, anxious, intrusive, demanding. ABS 4. PRN Ativan 1 mg given at 0242. Pt out requested for stool softener. PRN Milk of Magnesia 10 ml given at 0314. Minimally effective. Requested for Tylenol 650 mg for headache 5/10 given at 0506. Effective at 0606. Mostly awake this night demanding. Slept 2.75 hours.

## 2020-01-08 NOTE — Unmapped (Signed)
Problem: Major Thought Disorder  Goal: DC Criteria-Thought D/O won't neg. affect daily functioning  Outcome: Progressing       Patient is up to the desk often, can be demanding and very persistent. Had to be reminded of boundaries. Although, he is pleasant with staff. Excited to to go to group. Only took half his scheduled medications. Compliant with Seroquel, but not Depakote. Refused ordered lisinopril newly ordered for high blood pressure, but did take norvasc and hydrochlorathiazide. Selectively interactive with peers. Denies SI/HI/AVH.

## 2020-01-08 NOTE — Unmapped (Addendum)
Note: 1530-1930    Patient calm and cooperative. Seen on the phone intermittently. Makes needs known. Up to nurses station frequently. Has to be reminded to wear mask often. BP remains slightly elevated, POD notified, no new orders placed. Continue to monitor. Denies SI/HI/AVH. No behavioral issues.

## 2020-01-08 NOTE — Unmapped (Signed)
Occupational Therapy/Therapeutic Recreation Progress Note      Nathaniel Maxwell  [id number]      Group: Task  Attendance: Attended  60/60 minutes  Level of Participation: Active, Interacted with staff, Initiated Comment(s) and Followed Directions    Pt was eager to attend group and was open to participating in a game with peer with and therapist.  Pt followed as directed and independently. He was able to keep up with turn taking while talking. Conversation was both disorganized and linear and somewhat pressured but very polite. Some grandiose with future goals.  Pt was very complementary towards therapy groups. Pt reports be was taking his medication prior to admission.  No bx concerns noted at this time     Theresa Mulligan, CTRS  01/08/2020 11:55 AM

## 2020-01-09 MED ORDER — psyllium husk (aspartame) (METAMUCIL) 3.4 gram PwPk 1 packet
3.4 | Freq: Two times a day (BID) | ORAL | Status: AC | PRN
Start: 2020-01-09 — End: 2020-01-10
  Administered 2020-01-09: 11:00:00 1 via ORAL

## 2020-01-09 MED ORDER — QUEtiapine (SEROQUEL) tablet 100 mg
100 | Freq: Two times a day (BID) | ORAL | Status: AC
Start: 2020-01-09 — End: 2020-01-10
  Administered 2020-01-10: 14:00:00 100 mg via ORAL

## 2020-01-09 MED ORDER — haloperidol lactate (HALDOL) injection 5 mg
5 | Freq: Two times a day (BID) | INTRAMUSCULAR | Status: AC
Start: 2020-01-09 — End: 2020-01-10
  Administered 2020-01-10: 02:00:00 5 mg via INTRAMUSCULAR

## 2020-01-09 MED ORDER — traZODone (DESYREL) tablet 50 mg
50 | Freq: Once | ORAL | Status: AC
Start: 2020-01-09 — End: 2020-01-09
  Administered 2020-01-09: 05:00:00 50 mg via ORAL

## 2020-01-09 MED FILL — METFORMIN ER 500 MG TABLET,EXTENDED RELEASE 24 HR: 500 500 MG | ORAL | Qty: 1

## 2020-01-09 MED FILL — TERAZOSIN 5 MG CAPSULE: 5 5 MG | ORAL | Qty: 2

## 2020-01-09 MED FILL — METAMUCIL FIBER SINGLES 3.4 GRAM ORAL POWDER PACKET: 3.4 3.4 gram | ORAL | Qty: 1

## 2020-01-09 MED FILL — HYDROCHLOROTHIAZIDE 25 MG TABLET: 25 25 MG | ORAL | Qty: 1

## 2020-01-09 MED FILL — HALOPERIDOL LACTATE 5 MG/ML INJECTION SOLUTION: 5 5 mg/mL | INTRAMUSCULAR | Qty: 1

## 2020-01-09 MED FILL — SEROQUEL 100 MG TABLET: 100 100 mg | ORAL | Qty: 1

## 2020-01-09 MED FILL — ATORVASTATIN 20 MG TABLET: 20 20 MG | ORAL | Qty: 1

## 2020-01-09 MED FILL — AMLODIPINE 10 MG TABLET: 10 10 MG | ORAL | Qty: 1

## 2020-01-09 MED FILL — BENZTROPINE 1 MG TABLET: 1 1 MG | ORAL | Qty: 1

## 2020-01-09 MED FILL — DEPAKOTE 500 MG TABLET,DELAYED RELEASE: 500 500 mg | ORAL | Qty: 1

## 2020-01-09 MED FILL — LORATADINE 10 MG TABLET: 10 10 mg | ORAL | Qty: 1

## 2020-01-09 MED FILL — SEROQUEL 50 MG TABLET: 50 50 mg | ORAL | Qty: 1

## 2020-01-09 MED FILL — TRAZODONE 50 MG TABLET: 50 50 MG | ORAL | Qty: 1

## 2020-01-09 MED FILL — LISINOPRIL 10 MG TABLET: 10 10 MG | ORAL | Qty: 1

## 2020-01-09 NOTE — Unmapped (Signed)
Problem: Major Thought Disorder  Goal: DC Criteria-Thought D/O won't neg. affect daily functioning  Outcome: Progressing     Problem: Major Mood Disorder  Goal: DC Criteria-Mania/depression won't neg affect daily function  Outcome: Progressing     Problem: Potential for Harm to Self or Others  Goal: DC Criteria- Patient will not present active danger to self  Description: patient will not present an active danger to themselves due to a psych  illness  Outcome: Progressing     Problem: Unable to care for basic needs  Goal: DC Criteria-Pt will utilize gestures to indicate wants/needs  Outcome: Progressing     Patient was visible on the unit this morning getting ready to attend probate court. Patient took a shower this morning and attended to personal needs. Patient was compliant with some of his meds, refused Seroquel, lisinopril, Depakote and metformin. Patient denied any suicidal or homicidal ideations, also denied hearing voices or having visual hallucinations. Patient denies any discomfort at this time. All safety protocol observed.

## 2020-01-09 NOTE — Unmapped (Signed)
Occupational Therapy/Therapeutic Recreation Group Note      Group: Customer service manager  Attendance: Attended  45/60 minutes  Level of Participation: Active, Interacted with peer(s), Interacted with staff, Initiated Comment(s) and Followed Directions    Patient came to group late after making a telephone call.  Pt actively participated and was on topic and supportive to peers.  He answered the questions related to treatment and discharge planning correctly.  He is actually very knowledgeable about the resources available.  Pt recognized that he was having difficulty because he was told he came out naked.

## 2020-01-09 NOTE — Unmapped (Signed)
Patient has been visible on the unit since his return from probate court. Patient observed interacting with peers and staff, and also making phone calls. Patient continues to make needs known and provided reminders on keeping his mask on. No behavioral issues at this time.
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
## 2020-01-09 NOTE — Unmapped (Signed)
Occupational Therapy/Therapeutic Recreation Group Note      Group: Task  Attendance: Attended  60/60 minutes  Level of Participation: Active, Interacted with peer(s), Interacted with staff, Initiated Comment(s) and Followed Directions    Pt attended group upon invite.  Pt was cooperative and engaged when invited in a task of choice.  Pt is not as pressured in speech but at times is hyper verbal but responds to cues.  He reported that he came to [facility] to get help for his sleep.  Reported only females will listen.  He engaged in a Academic librarian, ( new learning) and was able to focus about 75% of the time but needed repetitive cueing.  Pt also used the computer and signed the media waiver.
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
## 2020-01-09 NOTE — Unmapped (Addendum)
Assumed care at 1930. Pt out in the milieu interacting with peers. Pt denies suicidal, homicidal ideations. Also denies auditory and visual hallucinations. Pt complies with the Haldol 5 mg injection and refused all other meds. (Atorvastatin 20 mg, Cogentin 1 mg, and Terazosin 10 mg) Pt told me his sister is a doctor and told him to take his medication but he still refused. Pt was pleasant to talk with and is sleeping alright. No other issues and safety maintained per protocol.

## 2020-01-09 NOTE — Unmapped (Signed)
Occupational Therapy/Therapeutic Recreation Group Note      Group: Health Management  Attendance: Excused  0 minutes  Level of Participation: None    Pt excused this AM, speaking with a provider.

## 2020-01-09 NOTE — Unmapped (Signed)
Problem: Major Mood Disorder  Goal: DC Criteria-Mania/depression won't neg affect daily function  Description: Social work will assess and follow patient during this admission.  Intervention: Other psychotherapeutic interventions (describe)  Description: Other psychotherapeutic interventions (describe)  Note: Patient was probated to [facility] 5th floor today and the Notice Determining Mental Illness ([id number] ) was placed in patient's chart. Outpatient Community Probate was not ordered for patient.  Forced medications request was also granted by court. Entry Granting Motion for Forced Medication was also placed in patient's chart.

## 2020-01-10 ENCOUNTER — Telehealth (HOSPITAL_COMMUNITY): Payer: Self-pay | Admitting: *Deleted

## 2020-01-10 ENCOUNTER — Ambulatory Visit (HOSPITAL_COMMUNITY): Payer: Medicare (Managed Care)

## 2020-01-10 DIAGNOSIS — F312 Bipolar disorder, current episode manic severe with psychotic features: Secondary | ICD-10-CM

## 2020-01-10 MED ORDER — benztropine (COGENTIN) tablet 0.5 mg
0.5 | Freq: Every evening | ORAL | Status: AC
Start: 2020-01-10 — End: 2020-01-10

## 2020-01-10 MED ORDER — haloperidol lactate (HALDOL) injection 5 mg
5 | Freq: Two times a day (BID) | INTRAMUSCULAR | Status: AC
Start: 2020-01-10 — End: 2020-01-12

## 2020-01-10 MED ORDER — psyllium husk (aspartame) (METAMUCIL) 3.4 gram PwPk
3.4 | Freq: Two times a day (BID) | ORAL | Status: AC | PRN
Start: 2020-01-10 — End: 2020-01-17

## 2020-01-10 MED ORDER — QUEtiapine (SEROQUEL) tablet 150 mg
50 | Freq: Two times a day (BID) | ORAL | Status: AC
Start: 2020-01-10 — End: 2020-01-12
  Administered 2020-01-11 – 2020-01-12 (×4): 150 mg via ORAL

## 2020-01-10 MED FILL — HYDROCHLOROTHIAZIDE 25 MG TABLET: 25 25 MG | ORAL | Qty: 1

## 2020-01-10 MED FILL — LORATADINE 10 MG TABLET: 10 10 mg | ORAL | Qty: 1

## 2020-01-10 MED FILL — BENZTROPINE 1 MG TABLET: 1 1 MG | ORAL | Qty: 1

## 2020-01-10 MED FILL — ATORVASTATIN 20 MG TABLET: 20 20 MG | ORAL | Qty: 1

## 2020-01-10 MED FILL — METFORMIN ER 500 MG TABLET,EXTENDED RELEASE 24 HR: 500 500 MG | ORAL | Qty: 1

## 2020-01-10 MED FILL — DEPAKOTE 500 MG TABLET,DELAYED RELEASE: 500 500 mg | ORAL | Qty: 1

## 2020-01-10 MED FILL — SEROQUEL 100 MG TABLET: 100 100 mg | ORAL | Qty: 1

## 2020-01-10 MED FILL — TERAZOSIN 5 MG CAPSULE: 5 5 MG | ORAL | Qty: 2

## 2020-01-10 MED FILL — AMLODIPINE 10 MG TABLET: 10 10 MG | ORAL | Qty: 1
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
## 2020-01-10 NOTE — Unmapped (Signed)
Occupational Therapy/Therapeutic Recreation Progress Note      Nathaniel Maxwell  [id number]      Group: Leisure Ed  Attendance: Attended  60/60 minutes  Level of Participation: Active, Interacted with peer(s), Interacted with staff, Initiated Comment(s) and Followed Directions    Pt readily attended group with active participation.  In discussion on leisure pt was attentive and shared in discussion.  Pt's responses often would quickly derail in a tangent. Pt would recognize this and stop himself. He has insight that he needs mental health help and gave praise to being in the hospital and the DR.  Pt stated he came to [facility] for a vacation and rest as he needed it but he states he could only sleep for a few hours. Shared he was like a top spinning out of control.  With the structured game Leisure Scattergories pt was instructed to ID leisure with a specific letter.  Pt was accurate with staying with the letter but words he ID were not leisure activities.  Pt was very talkative.     [name]  01/10/2020 3:53 PM

## 2020-01-10 NOTE — Unmapped (Signed)
Problem: Major Thought Disorder  Goal: DC Criteria-Thought D/O won't neg. affect daily functioning  Outcome: Progressing   Patient states feeling Manic and shows insight into mental illness. Having difficulty trusting in medications and doses stating he feels abilify rather than haldol works better. States having some stiffness in his shoulders after receiving. Requesting cogentin. Informed on receiving last night with haldol and would pass this on to treatment team also. States I would be due to for my long acting abilify injection today after last having a month ago. Patient also feels seroquel dose is too high despite stating poor to no sleep last night and refusing due to feeling he needed only a 50 mg dose.  Patient now states being receptive to taking all medications after probate court and not wanting injections.  Encouragement and reassurance given of staff wanting best optimal outcome.   Voiced appreciation.   Denies any thoughts to harm self or others  No behavioral issues and following direction of staff
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
## 2020-01-10 NOTE — Unmapped (Signed)
Occupational Therapy/Therapeutic Recreation Group Note      Group: Health Management  Attendance: Attended  25/30 minutes  Level of Participation: Active, Interacted with peer(s), Interacted with staff, Remained on Task/Topic/Number of Minutes: 25/25, Initiated Comment(s) and Followed Directions      Pt readily joined am group session. Offered to help this Clinical research associate set up room as he was the first pt to arrive to the group room. Pt rotated between basketball and ring toss stations. Stated it felt good to get moving and get some of this energy out. Shared that he was a grandfather of two granddaughters. Pt was social and cooperative. Moved about room and was appropriate. Left a few minutes early as pt stated he thought he need to take a shower that he got sweaty in the session.

## 2020-01-10 NOTE — Unmapped (Signed)
[facility] Pharmacy Services: Antipsychotic Monitoring Note    Nathaniel Maxwell, 45 y.o. male  Ht Readings from Last 1 Encounters:   02/21/19 5' 9 (1.753 m)     Wt Readings from Last 3 Encounters:   02/21/19 (!) 233 lb (105.7 kg)   12/06/18 (!) 249 lb (112.9 kg)   08/14/18 (!) 237 lb (107.5 kg)       BMI: 34.41kg/m2    Antipsychotic Medications       Dose Frequency Start End    haloperidol lactate (HALDOL) injection 5 mg 5 mg 2 times daily 01/09/2020     Admin Instructions: BASELINE ECG OR TELEMETRY REQUIRED PRIOR TO IV ADMINISTRATION.    Route: Intramuscular    Linked Group 1: Or Linked Group Details        QUEtiapine (SEROQUEL) tablet 100 mg 100 mg 2 times daily 01/09/2020     Route: Oral    Linked Group 1: Or Linked Group Details        risperiDONE M-TAB (RISPERDAL M-TABS) disintegrating tablet 1 mg 1 mg 4 times daily PRN 01/06/2020     Admin Instructions: Give 1 mg for ABS score of 1-4, give 2 mg for ABS score 5-9. If score greater than or equal to 10, please contact Psych OD. MAXIMUM OF 4 MG PER DAY.    Route: Oral    Linked Group 2: Or Linked Group Details        risperiDONE M-TAB (RISPERDAL M-TABS) disintegrating tablet 2 mg 2 mg 2 times daily PRN 01/06/2020     Admin Instructions: Give 1 mg for ABS score of 1-4, give 2 mg for ABS score 5-9. If score greater than or equal to 10, please contact Psych OD. MAXIMUM OF 4 MG PER DAY.    Route: Oral    Linked Group 2: Or Linked Group Details        ziprasidone (GEODON) injection 20 mg 20 mg Use as directed PRN 01/06/2020     Admin Instructions: Reconstitute with 1.69mL Sterile Water for injection. Intramuscular use ONLY.  Reconstitute vial with 1.2 mL Sterile Water prior to IM administration. MAXIMUM DOSE = 40 mg in 24 hours    Route: Intramuscular    Linked Group 3: And Linked Group Details              Indication: Psychosis, unspecified     Recommendations  - A1c indicates pre-diabetes; may consider addition of metformin 850 mg daily given metabolic impact of antipsychotic.  Continue to monitor.  - Blood pressure demonstrating some elevations. Previously prescribed lisinopril and was d/c this admission. Consider restarting antihypertensive therapy  - Last ECG in 07/2018, QTc 446 ms, consider obtaining ECG  - Please encourage healthy diet and exercise as appropriate for this patient.             BP Readings from Last 3 Encounters:   01/10/20 (!) 149/100   02/21/19 121/77   12/06/18 134/78     Pulse Readings from Last 3 Encounters:   01/10/20 98   02/21/19 103   12/06/18 90       Aims total score: 0    Metabolic Monitoring  Lipid panel  Lab Results   Component Value Date    CHOLTOT 194 01/05/2020    TRIG 41 01/05/2020    HDL 74 01/05/2020    LDL 112 01/05/2020     10-yr risk ASCVD Score: The 10-year ASCVD risk score Denman George DC Jr., et al., 2013) is: 15.4%  Values used to calculate the score:      Age: 43 years      Sex: Male      Is Non-Hispanic African American: Yes      Diabetic: Yes      Tobacco smoker: No      Systolic Blood Pressure: 149 mmHg      Is BP treated: Yes      HDL Cholesterol: 74 mg/dL      Total Cholesterol: 194 mg/dL         Hemoglobin W1X  Lab Results   Component Value Date    HGBA1C 6.3 (H) 01/08/2020        Known patient risk factors for metabolic syndrome  Abdominal obesity (Men >40 in; Women >35in)  Blood pressure (>130/85)    Cardiac Monitoring  Last ECG: 07/19/2018  QTc interval: 446 ms    Lab Results   Component Value Date    K 3.7 01/05/2020     Lab Results   Component Value Date    MG 2.1 07/20/2018     Known risk factors for QTc prolongation: currently prescribed Multiple QTc prolonging agents     Chantell Cantrell  PharmD Candidate 2022  01/10/20  1:54 PM

## 2020-01-10 NOTE — Unmapped (Signed)
Problem: Major Mood Disorder  Goal: DC Criteria-Mania/depression won't neg affect daily function  Description: Social work will assess and follow patient during this admission.  Intervention: Other psychotherapeutic interventions (describe)  Description: Other psychotherapeutic interventions (describe)  Note: Patient now taking all of his medications this morning after refusing some of them over the last several days. Patient attending groups, appears to be improving ans showing some insight.

## 2020-01-10 NOTE — Unmapped (Signed)
Occupational Therapy/Therapeutic Recreation Group Note      Group: Task  Attendance: Attended  60/60 minutes  Level of Participation: Active, Interacted with peer(s), Interacted with staff, Initiated Comment(s) and Followed Directions    Pt attended group upon invite.  Pt is pleasant and cooperative but tangential with hyper verbal speech.  Pt was disorganized when gathering of materials but his outcome was correct.  He was able to pattern and complete a bracelet.  He was making them for his nieces.  He also spoke of having a 44 year old son whom he does not have daily contact with right now which is stressful.  Pt spoke of his own Father committing suicide at age 17.  He stated he deals with that while having his own mental health diagnosis.  Pt remained in group the entire time.  He is social amongst peers.  Pt spoke of being up from 2:30 am and had much difficulty with sleep.
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
## 2020-01-10 NOTE — Unmapped (Signed)
Patient visible on unit much of shift, on phone and pacing near the nurses station. He stated that he wanted to stay awake for as long as he could so he could sleep as long as possible in the morning. Medication compliant. Patient talked about being bipolar and his beast that comes out when he is manic, how he really likes the ladies who run group, but that he hates when he irritates people. Patient denied SI/HI/AVH. Safety checks done throughout shift per unit protocol. No safety concerns noted.  No s/s of cough, fever, or shortness of breath.

## 2020-01-11 MED ORDER — calcium carbonate (TUMS) chewable tablet 500 mg
200 | Freq: Three times a day (TID) | ORAL | Status: AC | PRN
Start: 2020-01-11 — End: 2020-01-17
  Administered 2020-01-11 – 2020-01-16 (×10): 500 mg via ORAL

## 2020-01-11 MED ORDER — melatonin tablet Tab 3 mg
3 | Freq: Every evening | ORAL | Status: AC | PRN
Start: 2020-01-11 — End: 2020-01-17
  Administered 2020-01-12 – 2020-01-17 (×6): 3 mg via ORAL

## 2020-01-11 MED ORDER — neomycin-bacitracin zn-polymyxin (NEOSPORIN) topical ointment packet
3.5-400-5000 | Freq: Two times a day (BID) | TOPICAL | Status: AC
Start: 2020-01-11 — End: 2020-01-17
  Administered 2020-01-11 – 2020-01-17 (×12): 1 via TOPICAL

## 2020-01-11 MED FILL — CALCIUM ANTACID 200 MG CALCIUM (500 MG) CHEWABLE TABLET: 200 200 mg calcium (500 mg) | ORAL | Qty: 1

## 2020-01-11 MED FILL — SEROQUEL 50 MG TABLET: 50 50 mg | ORAL | Qty: 1

## 2020-01-11 MED FILL — LORATADINE 10 MG TABLET: 10 10 mg | ORAL | Qty: 1

## 2020-01-11 MED FILL — ATORVASTATIN 20 MG TABLET: 20 20 MG | ORAL | Qty: 1

## 2020-01-11 MED FILL — AMLODIPINE 10 MG TABLET: 10 10 MG | ORAL | Qty: 1

## 2020-01-11 MED FILL — MILK OF MAGNESIA CONCENTRATED 2,400 MG/10 ML ORAL SUSPENSION: 2400 2,400 mg/10 mL | ORAL | Qty: 10

## 2020-01-11 MED FILL — TRIPLE ANTIBIOTIC 3.5 MG-400 UNIT-5,000 UNIT TOPICAL OINTMENT PACKET: 3.5-400-5000 3.5-400-5,000 mg-unit-unit | TOPICAL | Qty: 1

## 2020-01-11 MED FILL — BENZTROPINE 1 MG TABLET: 1 1 MG | ORAL | Qty: 1

## 2020-01-11 MED FILL — DEPAKOTE 500 MG TABLET,DELAYED RELEASE: 500 500 mg | ORAL | Qty: 1

## 2020-01-11 MED FILL — METFORMIN ER 500 MG TABLET,EXTENDED RELEASE 24 HR: 500 500 MG | ORAL | Qty: 1

## 2020-01-11 MED FILL — HYDROCHLOROTHIAZIDE 25 MG TABLET: 25 25 MG | ORAL | Qty: 1

## 2020-01-11 NOTE — Unmapped (Addendum)
Patient visible on the unit pacing and watching television at times.  Patient is medication compliant with evening medications.He visit from a family member today. Speech is pressured and tangential.  Patient ate dinner and had snacks. PRN Melatonin 3 mg given for sleep with pending results. No complaints of pain and no outbursts this shift.    Vitals:    01/11/20 0823 01/11/20 2000   BP: (!) 141/96 134/80   Pulse: 100 100   Resp: 17 16   Temp: 98 ??F (36.7 ??C) 97.8 ??F (36.6 ??C)   SpO2: 100% 100%

## 2020-01-11 NOTE — Unmapped (Signed)
Patient slept all night with no issues. Up at 0600 to watch television and talk on the phone. Safety checks done throughout shift per unit protocol. No safety concerns noted.  No s/s of cough, fever, or shortness of breath.

## 2020-01-11 NOTE — Unmapped (Signed)
Occupational Therapy/Therapeutic Recreation Group Note      Group: Task  Attendance: Attended  45/45 minutes  Level of Participation: Active    Pt was overheard yelling on unit with staff when this Writer was gathering for group. Pt was able to calm himself down when met 1:1 outside of group room. Pt stated he has been looking forward to group all day and that he was able to maintain calm and appropriate behaviors when in group. Pt verbalized some paranoia with showing therapy a list of names of people (staff and peers) that have not been good to him or are threatening him here at the hospital. Pt was reassured he was safe in the hospital. Pt appeared receptive of staff's response. Pt engaged in sitting in the glider while listening to music. Pt also asked to make a beaded necklace. Pt informed staff this would be his 2nd and final beaded task for this hospitalization per group room protocol. Pt's task was organized and creative. Pt focused on task 50% of the time. Pt was pressured and displayed mania at times. Pt was able to self-regulate emotions when in group with remaining calm. Pt thanked staff for group afterwards and said that he felt much better since coming to group. Aside from when initially arriving to group, Pt demonstrated pleasant and appropriate behaviors.

## 2020-01-11 NOTE — Unmapped (Addendum)
Problem: Major Thought Disorder  Goal: DC Criteria-Thought D/O won't neg. affect daily functioning  Outcome: Progressing  Patient visible on the unit pacing intermittently. Denies suicidal/homicidal ideations and auditory/visual hallucinations. Patient appears manic and was constantly at the nurses station requesting for different things. Speech is pressured. Patient took scheduled medications but refused Depakote. PRN Neomycin topical ointment for c/o skin irriation Tums c/o indigestion and Milk of magnesia c/o constipation given this shift. Appetite is good. Appearance is unkempt. No unsafe behaviors noted. Safety measures in place per unit protocol.       Vitals:    01/11/20 0823 01/11/20 2000   BP: (!) 141/96 134/80   Pulse: 100 100   Resp: 17 16   Temp: 98 ??F (36.7 ??C) 97.8 ??F (36.6 ??C)   SpO2: 100% 100%     Scheduled Medications:  ??? amLODIPine  10 mg Oral Daily 0900   ??? atorvastatin  20 mg Oral Nightly (2100)   ??? benztropine  1 mg Oral Nightly (2100)   ??? divalproex  500 mg Oral Daily 0900   ??? QUEtiapine  150 mg Oral BID    Or   ??? haloperidol lactate  5 mg Intramuscular BID   ??? hydroCHLOROthiazide  25 mg Oral Daily 0900   ??? loratadine  10 mg Oral Daily 0900   ??? metFORMIN  500 mg Oral Daily with breakfast   ??? neomycin-bacitracnZn-polymyxnB  1 packet Topical BIDN   ??? terazosin  10 mg Oral Nightly (2100)       Active Ambulatory Problems     Diagnosis Date Noted   ??? Essential hypertension 11/13/2017   ??? Seasonal allergic rhinitis due to pollen 11/13/2017   ??? Type 2 diabetes mellitus without complication, without long-term current use of insulin (CMS Dx) 03/07/2018   ??? Chronic pain of left knee 03/07/2018   ??? Bipolar affective disorder, manic, severe (CMS Dx) 07/27/2018   ??? Encounter for long-term current use of high risk medication 08/14/2018   ??? 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 Establishing care with new doctor, encounter for 11/15/2017   ???  Elevated hemoglobin A1c 12/07/2017   ??? Prediabetes 03/05/2018     Past Medical History:   Diagnosis Date   ??? Bipolar affective disorder (CMS Dx)    ??? HTN (hypertension)    ??? Mycosis fungoides (CMS Dx)
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
## 2020-01-12 MED ORDER — QUEtiapine (SEROQUEL) tablet 200 mg
200 | Freq: Every evening | ORAL | Status: AC
Start: 2020-01-12 — End: 2020-01-13

## 2020-01-12 MED ORDER — QUEtiapine (SEROQUEL) tablet 150 mg
50 | Freq: Every morning | ORAL | Status: AC
Start: 2020-01-12 — End: 2020-01-15
  Administered 2020-01-13 – 2020-01-15 (×3): 150 mg via ORAL

## 2020-01-12 MED ORDER — haloperidol lactate (HALDOL) injection 5 mg
5 | Freq: Every evening | INTRAMUSCULAR | Status: AC
Start: 2020-01-12 — End: 2020-01-13
  Administered 2020-01-13: 01:00:00 5 mg via INTRAMUSCULAR

## 2020-01-12 MED ORDER — haloperidollactateHALDOLinjection5mg
5 | Freq: Every morning | INTRAMUSCULAR | Status: AC
Start: 2020-01-12 — End: 2020-01-15

## 2020-01-12 MED FILL — TRIPLE ANTIBIOTIC 3.5 MG-400 UNIT-5,000 UNIT TOPICAL OINTMENT PACKET: 3.5-400-5000 3.5-400-5,000 mg-unit-unit | TOPICAL | Qty: 1

## 2020-01-12 MED FILL — LORATADINE 10 MG TABLET: 10 10 mg | ORAL | Qty: 1

## 2020-01-12 MED FILL — METFORMIN ER 500 MG TABLET,EXTENDED RELEASE 24 HR: 500 500 MG | ORAL | Qty: 1

## 2020-01-12 MED FILL — MILK OF MAGNESIA CONCENTRATED 2,400 MG/10 ML ORAL SUSPENSION: 2400 2,400 mg/10 mL | ORAL | Qty: 10

## 2020-01-12 MED FILL — AMLODIPINE 10 MG TABLET: 10 10 MG | ORAL | Qty: 1

## 2020-01-12 MED FILL — RISPERIDONE 2 MG DISINTEGRATING TABLET: 2 2 MG | ORAL | Qty: 1

## 2020-01-12 MED FILL — TERAZOSIN 5 MG CAPSULE: 5 5 MG | ORAL | Qty: 2

## 2020-01-12 MED FILL — HYDROCHLOROTHIAZIDE 25 MG TABLET: 25 25 MG | ORAL | Qty: 1

## 2020-01-12 MED FILL — LORAZEPAM 1 MG TABLET: 1 1 MG | ORAL | Qty: 1

## 2020-01-12 MED FILL — HALOPERIDOL LACTATE 5 MG/ML INJECTION SOLUTION: 5 5 mg/mL | INTRAMUSCULAR | Qty: 1

## 2020-01-12 MED FILL — DEPAKOTE 500 MG TABLET,DELAYED RELEASE: 500 500 mg | ORAL | Qty: 1

## 2020-01-12 MED FILL — CALCIUM ANTACID 200 MG CALCIUM (500 MG) CHEWABLE TABLET: 200 200 mg calcium (500 mg) | ORAL | Qty: 1

## 2020-01-12 MED FILL — SEROQUEL 50 MG TABLET: 50 50 mg | ORAL | Qty: 1

## 2020-01-12 MED FILL — MELATONIN 3 MG TABLET: 3 3 mg | ORAL | Qty: 1

## 2020-01-12 NOTE — Unmapped (Addendum)
Patient came to RN requesting more Seroquel. He continues to talk very rapidly and frequently comes to staff with requests. He appears anxious and agitated and complains that he feels like he needs to sleep but has not been able to do so. He seems to have racing thoughts. ABS 8. He was given Risperdal and Ativan for anxiety/agitation and it was recommended that he try to lay down for a while. He returned to the nurse's station just a few minutes later to request to use his phone for banking needs. He was redirected again but started making phone calls. Continues to exhibit various manic behaviors. Verbalized some sexually inappropriate comments about using the seclusion room for conjugal visits. Intrusive into other patients care. He has repeatedly come to the desk today to tell RN about needs and behaviors of other patients. No other issues to note at this time. Will monitor.

## 2020-01-12 NOTE — Unmapped (Signed)
Occupational Therapy/Therapeutic Recreation Group Note      Group: Task  Attendance: Attended  35/45 minutes  Level of Participation: Active, Interacted with staff and Followed Directions    Pt readily attended group greeting writer by name after looking at her badge. He chose to create a necklace. While working he was slightly demanding asking for scissors and a sharp tool. Staff limited him to one sharp tool and he was receptive. He worked with good focus and was able to complete his task. He gave the necklace to staff to put in his locker. Thanked staff before leaving. Remained calm and cooperative. No bx concerns and appropriate affect.

## 2020-01-12 NOTE — Unmapped (Signed)
[id number]  Assumed care of pt at 2330. Pt complains of indigestion, PRN tums given. Pt slept approximately 1.5 hrs, up since then. Visible on unit at 0600, social with peers, helpful tries to clean unit. Makes phone calls. Denies SI/HI/AVH. Safety checks in place per protocol.

## 2020-01-12 NOTE — Unmapped (Signed)
Problem: Major Thought Disorder  Goal: DC Criteria-Thought D/O won't neg. affect daily functioning  Outcome: Progressing  Note: Nursing Note ([id number])    Nathaniel Maxwell, LOS: 6  Bipolar affective disorder, manic, severe (CMS Dx)    Assessment:   10:08 AM Met with patient for 1:1 shift assessment. Mood is restless but cooperative. Patient is medication compliant but got up and started to walk off in the middle of assessment. Speech is rapid and pressured. He verbalizes delusions of needing to be more alert on Sundays. The conversation about why he felt this was was disorganized and paranoid. Denies suicidal/homicidal ideations and auditory/visual hallucinations. He came to RN requesting something for sleep but appeared agitated, restless. He says he did not sleep last night and knows he needs to. He was given Ativan for his agitation. Awaiting effectiveness at this time. No unsafe behavior. No somatic complaints. Will continue to assess behavior, monitor for safety and offer support.    Demerius Podolak, RN  01/12/2020        Problem: Major Mood Disorder  Goal: DC Criteria-Mania/depression won't neg affect daily function  Outcome: Progressing     Problem: Potential for Harm to Self or Others  Goal: DC Criteria- Patient will not present active danger to self  Description: patient will not present an active danger to themselves due to a psych  illness  Outcome: Progressing  Goal: DC Criteria-Pt will not present an active danger to others  Outcome: Progressing     Problem: Other medical condition  Goal: DC Criteria-Pt will stay medically stable for F/u in OP  Outcome: Progressing     Problem: Unable to care for basic needs  Goal: DC Criteria-Pt will utilize gestures to indicate wants/needs  Outcome: Progressing

## 2020-01-12 NOTE — Unmapped (Signed)
Assumed care at 1930. Pt out in the milieu talking and interacting with peers and staff.Pt denies suicidal,and homicidal ideations as well as auditory and visual hallucinations. Pt was in a very happy mood. He request all his medications. He would like [name] instead of Haldol, or Haldol in pill form if possible. Pt appears to be doing better. MC no issues safety maintained.
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
## 2020-01-13 MED ORDER — haloperidol lactate (HALDOL) injection 5 mg
5 | Freq: Every evening | INTRAMUSCULAR | Status: AC
Start: 2020-01-13 — End: 2020-01-14

## 2020-01-13 MED ORDER — QUEtiapine (SEROQUEL) tablet 300 mg
300 | Freq: Every evening | ORAL | Status: AC
Start: 2020-01-13 — End: 2020-01-14
  Administered 2020-01-14: 02:00:00 300 mg via ORAL

## 2020-01-13 MED FILL — CALCIUM ANTACID 200 MG CALCIUM (500 MG) CHEWABLE TABLET: 200 200 mg calcium (500 mg) | ORAL | Qty: 1

## 2020-01-13 MED FILL — TRIPLE ANTIBIOTIC 3.5 MG-400 UNIT-5,000 UNIT TOPICAL OINTMENT PACKET: 3.5-400-5000 3.5-400-5,000 mg-unit-unit | TOPICAL | Qty: 1

## 2020-01-13 MED FILL — DEPAKOTE 500 MG TABLET,DELAYED RELEASE: 500 500 mg | ORAL | Qty: 1

## 2020-01-13 MED FILL — ATORVASTATIN 20 MG TABLET: 20 20 MG | ORAL | Qty: 1

## 2020-01-13 MED FILL — SEROQUEL 50 MG TABLET: 50 50 mg | ORAL | Qty: 1

## 2020-01-13 MED FILL — LORAZEPAM 1 MG TABLET: 1 1 MG | ORAL | Qty: 1

## 2020-01-13 MED FILL — SEROQUEL 200 MG TABLET: 200 200 mg | ORAL | Qty: 1

## 2020-01-13 MED FILL — BENZTROPINE 1 MG TABLET: 1 1 MG | ORAL | Qty: 1

## 2020-01-13 MED FILL — HYDROCHLOROTHIAZIDE 25 MG TABLET: 25 25 MG | ORAL | Qty: 1

## 2020-01-13 MED FILL — METFORMIN ER 500 MG TABLET,EXTENDED RELEASE 24 HR: 500 500 MG | ORAL | Qty: 1

## 2020-01-13 MED FILL — TERAZOSIN 5 MG CAPSULE: 5 5 MG | ORAL | Qty: 2

## 2020-01-13 MED FILL — AMLODIPINE 10 MG TABLET: 10 10 MG | ORAL | Qty: 1

## 2020-01-13 MED FILL — LORATADINE 10 MG TABLET: 10 10 mg | ORAL | Qty: 1

## 2020-01-13 MED FILL — HALOPERIDOL LACTATE 5 MG/ML INJECTION SOLUTION: 5 5 mg/mL | INTRAMUSCULAR | Qty: 1

## 2020-01-13 NOTE — Unmapped (Addendum)
[facility] Psychiatry  Subsequent Day Note    Nathaniel Maxwell  [id number]    Interval History:  One day    Hospital Day: 7    Chief Complaint: Still very angry and difficult    Updates to Psych/Family/Substance/Medical/Social History:  Patient continues to have difficulty sleeping.  He resists meds sometimes and at other times, he is demanding to have more meds.  He is agitated and he has required prn meds.  He is hyperverbal and pressured.  He is partially cooperative in groups. He is able to focus only 50% of the time.      This is a psychosis unspecified of severe nature. Context is non adherence and duration is 1 week.            Medical Review Of Systems:  Respiratory: negative  Cardiovascular: negative  Gastrointestinal: negative  Musculoskeletal:negative    Objective:  Vitals:  Vital signs in last 24 hours:  Heart Rate:  [109] 109  Resp:  [17] 17  BP: (162)/(99) 162/99      Labs:  No results found for this or any previous visit (from the past 48 hour(s)).    Scheduled Meds:   ??? amLODIPine  10 mg Oral Daily 0900   ??? atorvastatin  20 mg Oral Nightly (2100)   ??? benztropine  1 mg Oral Nightly (2100)   ??? divalproex  500 mg Oral Daily 0900   ??? QUEtiapine  150 mg Oral QAM    Or   ??? haloperidol lactate  5 mg Intramuscular QAM   ??? QUEtiapine  300 mg Oral Nightly (2100)    Or   ??? haloperidol lactate  5 mg Intramuscular Nightly (2100)   ??? hydroCHLOROthiazide  25 mg Oral Daily 0900   ??? loratadine  10 mg Oral Daily 0900   ??? metFORMIN  500 mg Oral Daily with breakfast   ??? neomycin-bacitracnZn-polymyxnB  1 packet Topical BIDN   ??? terazosin  10 mg Oral Nightly (2100)           Mental Status Evaluation:  Appearance:  disheveled   Behavior:  He is still agitated during the day and at night complains that he cannot sleep.     Speech:  hyperverbal. pressured.     Mood:  angry   Affect:  increased in intensity   Thought Process:  circumstantial   Thought Content:  Multiple plans about building houses.  Still very grandiose.   Still reference conjugal visits indicating that he is at least somewhat sexually preoccupied.     Sensorium:  person, place and time/date   Memory: intact   Cognition:  grossly intact   Insight:  limited   Judgment:  limited     Assessment and Plan:                 AXIS I: psychosis unspecified.     AXIS II: Deferred     AXIS III: HTN.  I have lisinopril. BP is very high.      Assessment: Patient hyperverbal, needy and demanding.  Hypersexual also. Still dictating his treatment.  All of this is still true. We have taken over at least with meds with forced meds.     Plan: Even though we have taken over with forced meds, we are still negotiating with him about his preferences.  I have increased the Seroquel at night because he is complaining that he is not sleeping.      Benetta Spar  01/13/2020

## 2020-01-13 NOTE — Unmapped (Signed)
Occupational Therapy/Therapeutic Recreation Group Note      Group: Health Management  Attendance: Attended  30/30 minutes  Level of Participation: Active, Interacted with peer(s), Interacted with staff, Initiated Comment(s) and Followed Directions    Patient readily accepted the session upon invitation. 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 He offered fair efforts in returning demonstration gentle stretches and chair exercises he was shown. Patient displayed decreased activity tolerance for these physical activities and reported becoming SOB as a result. He took off his mask in response. Patient donned it again once OT guided him to take a few deep breaths and to slow his motions. He was adherent with this and remained so the remainder of the group. Patient assisted Clinical research associate in structuring and scoring a physical/cognitive game at the last portion of the session. He was polite and directible throughout.

## 2020-01-13 NOTE — Unmapped (Signed)
Occupational Therapy/Therapeutic Recreation Group Note      Group: OT  Attendance: Attended  30/40 minutes  Level of Participation: Active    Pt readily attended group upon invitation. Pt had appropriate affect and ADLs. Pt engaged in making a meditation/prayer beaded bracelet during group this date. Pt was able to follow instructions to task. Pt focused on task 75% of the time and task was organized. Pt stayed mostly to himself during group. Pt demonstrated pleasant and appropriate behaviors throughout group this date.

## 2020-01-13 NOTE — Unmapped (Signed)
Occupational Therapy/Therapeutic Recreation Group Note      Group: Customer service manager  Attendance: Attended  45/45 minutes  Level of Participation: Moderate, Interacted with peer(s), Interacted with staff and Followed Directions    Pt readily attended group this PM, appropriate ADLs and affect. Pt engaged in leisure pictionairy with staff and peers. Pt able to focus ~50% throughout group but completing activity without issue. Pt cooperative with peers throughout group, drew and IDed leisure activities appropriately throughout. No management issues.

## 2020-01-13 NOTE — Unmapped (Signed)
Patient visible on unit for first half of shift, socializing with peers while watching football and talking on the phone. Calm and cooperative. Medication compliant. Requested/given PO Ativan 1 mg for anxiety over lack of sleep and melatonin 3 mg for sleep, effective. Patient denied SI/HI/AVH. Safety checks done throughout shift per unit protocol. No safety concerns noted.  No s/s of cough, fever, or shortness of breath.

## 2020-01-13 NOTE — Unmapped (Signed)
Problem: Major Mood Disorder  Goal: DC Criteria-Mania/depression won't neg affect daily function  Description: Social work will assess and follow patient during this admission.  Outcome: Progressing  Note:     Social worker called patient's sister Kalep Full [phone] to get collateral and left a voicemail for her to call social worker back. Social work will continue to follow.

## 2020-01-13 NOTE — Unmapped (Signed)
Occupational Therapy/Therapeutic Recreation Group Note      Group: Task  Attendance: Attended  45/60 minutes  Level of Participation: Active, Interacted with peer(s), Interacted with staff, Initiated Comment(s) and Followed Directions    Pt readily joined am group session. A bit disorganized upon arrival asking for beaded project that he wanted to look and check it out and add some clasps. Pt reminded that project had been tied off and was not able to have any changes made. Pt agreeable to working on coloring task. Pt left room x2 and returned. Listened to music briefly. Able to sit at table and play several rounds of Trash card game. Pt had a different way of playing than this Clinical research associate and was able to teach therapist. Healthy competition displayed. Provided with new gown at end of session. A bit unsettled initially but able to display better focus during game. Min cueing needed at start of game when turning over wrong card numbers, then 75% focus displayed with no errors. Pleasant and social.

## 2020-01-13 NOTE — Unmapped (Signed)
Day Shift Nursing Note [phone])    Nathaniel Maxwell DOB [birth year]  LOS 7  Bipolar affective disorder, manic, severe (CMS Dx)    Patient out on unit to meet needs. Social with staff and peers. Polite, calm, and cooperative often offering to help staff and peers. Denies suicidal/homicidal ideation, and auditory/visual hallucinations. Consumed 75-100% of meals and snacks. Attended and participated in group this shift per OT / TR note. Independent with activities of daily living, and practicing adequate hygiene routine. Compliant with scheduled medication. PRN medication administered this shift; Tums for c/o indigestion, effective. No unsafe/inappropriate behavior observed. No complaints voiced. Pt amendable to moving to room 5168 - B to accommodate unit needs. Safety maintained per unit protocol.     Scheduled Medications  ??? amLODIPine  10 mg Oral Daily 0900   ??? atorvastatin  20 mg Oral Nightly (2100)   ??? benztropine  1 mg Oral Nightly (2100)   ??? divalproex  500 mg Oral Daily 0900   ??? QUEtiapine  150 mg Oral QAM    Or   ??? haloperidol lactate  5 mg Intramuscular QAM   ??? QUEtiapine  300 mg Oral Nightly (2100)    Or   ??? haloperidol lactate  5 mg Intramuscular Nightly (2100)   ??? hydroCHLOROthiazide  25 mg Oral Daily 0900   ??? loratadine  10 mg Oral Daily 0900   ??? metFORMIN  500 mg Oral Daily with breakfast   ??? neomycin-bacitracnZn-polymyxnB  1 packet Topical BIDN   ??? terazosin  10 mg Oral Nightly (2100)       Vitals:    01/11/20 2000 01/12/20 0809 01/12/20 0821 01/13/20 0850   BP: 134/80 (!) 162/99 (!) 162/99 159/83   BP Location: Right arm  Left arm Left arm   Patient Position: Sitting  Sitting Sitting   Pulse: 100  109 113   Resp: 16  17 18    Temp: 97.8 ??F (36.6 ??C)   98 ??F (36.7 ??C)   TempSrc: Oral   Oral   SpO2: 100%  100% 99%

## 2020-01-13 NOTE — Unmapped (Signed)
[id number]    Patient rested in bed with even and unlabored respirations.   No scheduled or PRN medications this shift.    Patient slept for approximately 6.25 hours.    No unsafe or inappropriate behaviors observed.   Safety maintained per unit protocol.

## 2020-01-14 MED ORDER — QUEtiapine (SEROQUEL) tablet 400 mg
400 | Freq: Every evening | ORAL | Status: AC
Start: 2020-01-14 — End: 2020-01-17
  Administered 2020-01-15 – 2020-01-17 (×3): 400 mg via ORAL

## 2020-01-14 MED ORDER — haloperidol lactate (HALDOL) injection 5 mg
5 | Freq: Every evening | INTRAMUSCULAR | Status: AC
Start: 2020-01-14 — End: 2020-01-17

## 2020-01-14 MED FILL — TERAZOSIN 5 MG CAPSULE: 5 5 MG | ORAL | Qty: 2

## 2020-01-14 MED FILL — LORATADINE 10 MG TABLET: 10 10 mg | ORAL | Qty: 1

## 2020-01-14 MED FILL — TRIPLE ANTIBIOTIC 3.5 MG-400 UNIT-5,000 UNIT TOPICAL OINTMENT PACKET: 3.5-400-5000 3.5-400-5,000 mg-unit-unit | TOPICAL | Qty: 1

## 2020-01-14 MED FILL — CALCIUM ANTACID 200 MG CALCIUM (500 MG) CHEWABLE TABLET: 200 200 mg calcium (500 mg) | ORAL | Qty: 1

## 2020-01-14 MED FILL — AMLODIPINE 10 MG TABLET: 10 10 MG | ORAL | Qty: 1

## 2020-01-14 MED FILL — QUETIAPINE 300 MG TABLET: 300 300 MG | ORAL | Qty: 1

## 2020-01-14 MED FILL — MELATONIN 3 MG TABLET: 3 3 mg | ORAL | Qty: 1

## 2020-01-14 MED FILL — METFORMIN ER 500 MG TABLET,EXTENDED RELEASE 24 HR: 500 500 MG | ORAL | Qty: 1

## 2020-01-14 MED FILL — BENZTROPINE 1 MG TABLET: 1 1 MG | ORAL | Qty: 1

## 2020-01-14 MED FILL — SEROQUEL 50 MG TABLET: 50 50 mg | ORAL | Qty: 1

## 2020-01-14 MED FILL — HYDROCHLOROTHIAZIDE 25 MG TABLET: 25 25 MG | ORAL | Qty: 1

## 2020-01-14 NOTE — Unmapped (Signed)
Daily Progress Note  [facility] Psychiatric Services at [facility], [place]       Chief Complaint:     Nathaniel Maxwell is a 45 y.o. Black or African American male who was admitted to the psychiatric inpatient unit on 01/05/2020 at10:59 AM for Manic Behavior. He is currently on Hospital Day #8 and is in room [phone]     Per chart patient has been refusing depakote.    Location: inpatient  Context: Rx-noncompliance  Aggravating Factors: Rx non-compliance. Divorced 1 year ago - ex wife helped support patient, keeping him comlinant      Subjective     I reviewed charting, discussed case with staff, and met with Nathaniel Maxwell on the ward. Over the last 24 hours, Nathaniel Maxwell has not required PRN agitation meds. Per staff, he has been socializing with peers and staff and has been attending scheduled groups.     Nathaniel Maxwell reports that sleeping well with the seroquil.    He denies suicidal ideation as well as homicidal ideation.    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 flight of ideas, activity increased, sleep decreased, talkative  Psychosis: auditory hallucinations, visual hallucinations, paranoia  Anxiety: None Reported    Review of Systems     Denies headache, nausea, vomiting, blurry vision, chest pain, SOB, abdominal pain, diarrhea, constipation, dysuria, dizziness, new rash.     Current Medications Ordered:     ??? amLODIPine  10 mg Oral Daily 0900   ??? atorvastatin  20 mg Oral Nightly (2100)   ??? benztropine  1 mg Oral Nightly (2100)   ??? divalproex  500 mg Oral Daily 0900   ??? QUEtiapine  150 mg Oral QAM    Or   ??? haloperidol lactate  5 mg Intramuscular QAM   ??? QUEtiapine  400 mg Oral Nightly (2100)    Or   ??? haloperidol lactate  5 mg Intramuscular Nightly (2100)   ??? hydroCHLOROthiazide  25 mg Oral Daily 0900   ??? loratadine  10 mg Oral Daily 0900   ??? metFORMIN  500 mg Oral Daily with breakfast   ???  neomycin-bacitracnZn-polymyxnB  1 packet Topical BIDN   ??? terazosin  10 mg Oral Nightly (2100)       In addition to the scheduled medications listed above, the following medications are ordered PRN: acetaminophen **OR** acetaminophen, calcium carbonate, LORazepam, LORazepam, magnesium hydroxide, melatonin, psyllium husk (aspartame), risperiDONE M-TAB **OR** risperiDONE M-TAB, ziprasidone **AND** sterile water.      Vitals:     Vitals:    01/12/20 0809 01/12/20 0821 01/13/20 0850 01/13/20 2111   BP: (!) 162/99 (!) 162/99 159/83 (!) 153/101   BP Location:  Left arm Left arm Left arm   Patient Position:  Sitting Sitting Sitting   Pulse:  109 113 100   Resp:  17 18 18    Temp:   98 ??F (36.7 ??C)    TempSrc:   Oral    SpO2:  100% 99% 100%         Physical Exam     Constitutional: He is oriented to person, place, and time. He is well-developed, well-nourished, and in no distress.    Head: Normocephalic and atraumatic.   Eyes: EOM are normal.   Neck: Normal range of motion.   Cardiovascular: Normal rate.    Pulmonary/Chest: Effort normal. He is not intubated. No respiratory distress.  Abdominal: Normal appearance, he exhibits no distension.   Musculoskeletal: Normal range of motion.   Neurological: He is alert and oriented to person, place, and time.   Skin: Skin is dry and intact. No rash noted.     Mental Status Exam:     Appearance: appears stated age, well developed, well nourished, appropriate attire and appropriate grooming, good hygiene,normal eye contact, cooperative with examiner.  Behavior/Movement: No PMR/PMA  Speech: regular rate, rhythm, tone and prosody.  Naming and repeating intact.  No aphasia. Production within normal limit. Fluent and nonpressured.  Affect: Elevated  Thought Process: organized and linear without any flight of ideas or loosening of associations.  Thought Content: Denies suicidal ideation, denies homicidal ideation. No ruminations or obsessions, no erotomanic, grandiose, persecutory, or  bizarre delusions were noted.   Perceptions: Denies auditory hallucinations, denies visual hallucinations. Not seen responding to internal stimuli.    Cognition:   -Sensorium: alert   -Orientation: oriented to person, place, situation and year   -Memory: recent and remote intact as evidenced by historical recall of verifiable facts from patient's personal history.    -Fund Of Knowledge: appears consistent with average intellect based on vocabulary, level of function, education.   -Insight: good   -Judgement: fair      Labs and Imaging:     I reviewed Nathaniel Maxwell's labs, diagnostic tests, and imaging. Please refer to the EMR for a record of these.      Assessment and Plan:     Nathaniel Maxwell is a 45 y.o. Black or African American male admitted on 01/05/2020 for acute mania. Patient is on hospital day 8.  Overall, patient's psychiatric symptoms are Minimally improved, still exhibiting signs of mania.    Psychiatric Diagnoses: Bipolar type 1    Psychiatric:  1. Safety: Continue to require inpatient psychiatry as the least restrictive means to ensure safety and stabilization.    Monitor mood, behavior, sleep, appetite.  Encourage participation in groups and other therapeutic activities on the ward as appropriate.  Continue to provide a safe and structured environment.  Substance Abuse/Dependence/Withdrawal: no active issues    2. Continue to negotiate with patient about taking meds. Team will get in touch with patient's NP today to gain more insight into patient's condition.    3. Continue to increase seroquil    Social: SW consulted, appreciate their assistance with current needs, collateral contact and discharge planning.      Patient seen and plan discussed and agreed upon by attending physician    Gardiner Coins, MS4
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
## 2020-01-14 NOTE — Unmapped (Signed)
Occupational Therapy/Therapeutic Recreation Group Note      Group: Task  Attendance: Attended  60/60 minutes  Level of Participation: Active, Interacted with peer(s), Interacted with staff, Initiated Comment(s) and Followed Directions    Pt readily attended group and was pleasant in interactions. He requested to paint a canvas and told Clinical research associate he was going to let it flow and paint something abstract. While painting pt was focused and listening to music. He painted until the last 10 minutes of group when he completed a painting of a flower and bee. He then played one round of trash the card game and taught his peer the directions. Demonstrated positive socialization in group and had no bx concerns. ADLs and affect appropriate.

## 2020-01-14 NOTE — Unmapped (Signed)
Patient slept most of the night with no issues. Very social with his roommate and kept asking him questions so he could follow his lead on things, such as how much of which hygiene items he should ask for. Safety checks done throughout shift per unit protocol. No safety concerns noted.  No s/s of cough, fever, or shortness of breath.

## 2020-01-14 NOTE — Unmapped (Addendum)
[id number]    Patient highly visible on unit. Social with staff, and peers. Pleasant and cooperative. Denies suicidal/homicidal ideation, and auditory/visual hallucinations. Did not participate in groups this shift. No unsafe/inappropriate behavior observed. Independent with activities of daily living, and practicing adequate hygiene routine. Compliant with all scheduled medication. PRN Ativan for anxiety, effective. PRN Melatonin for insomnia, effective. PRN TUMS for indigestion, effective. Safety maintained per unit protocol.

## 2020-01-14 NOTE — Unmapped (Addendum)
Problem: Major Thought Disorder  Goal: DC Criteria-Thought D/O won't neg. affect daily functioning  Outcome: Progressing     Problem: Major Mood Disorder  Goal: DC Criteria-Mania/depression won't neg affect daily function  Outcome: Progressing     Problem: Potential for Harm to Self or Others  Goal: DC Criteria-Pt will not present an active danger to others  Description: patient will not present an active danger to themselves due to a psych  illness  Outcome: Progressing     Pt's care plan reviewed and updated on this date. Pt has attended 15 group sessions and been excused from 1 group during this review period. Pt has attended health mgt, task, life skills and OT groups to date. Pt has had hyper verbal speech in group sessions and shown improved organization over this admit. Still needs some A making choices to start but once set up is showing increased focus. Pt is not as religiously preoccupied as he was at initial eval. Remains grandiose. Pt displaying appropriate behavior on the unit and is social with staff and peers. Pt met social skills goal. Cognitive goal updated and Role still appropriate. Next review to be completed on 01/21/20. Continue per plan of care.

## 2020-01-14 NOTE — Unmapped (Signed)
Problem: Major Mood Disorder  Goal: DC Criteria-Mania/depression won't neg affect daily function  Description: Social work will assess and follow patient during this admission.  Outcome: Progressing  Note:     Child psychotherapist met with patient in his room and discussed why he was in [place] and what his future plans are. He stated that he through the hospital would get him an apartment. Social worker explained that they doesn't do that and that he isn't a resident of [facility], so he cannot connect to services or get emergency Quick Access group housing. He said he will stay with his sister Dady for a couple days and head back to [place] where his fiancee lives. He wants to buy several houses in [place]. One being in [place]. Patient stated he would like to leave soon. Patient continues to be grandiose.     Social worker called patient's sister Dr. Margaretha Sheffield [phone], with patient's permission to get collateral and left a voicemail for her to call social worker back or give a time to have a phone conversation.     Social worker called patient's sister Justinn Welter [phone], with patient's permission. Voicemail box full.

## 2020-01-14 NOTE — Unmapped (Signed)
Day Shift Nursing Note [phone])    Maxum Bevans DOB 1975-11-05  LOS 8  Bipolar affective disorder, manic, severe (CMS Dx)    Patient out on unit to meet needs. Social with staff and peers. Calm and cooperative. Denies suicidal/homicidal ideation, and auditory/visual hallucinations. Compliant with scheduled medication. No PRN medication administered this shift. No unsafe/inappropriate behavior observed. No complaints voiced.     Consumed 75-100% of meals and snacks. Attended and participated in groups this shift per OT / TR notes. Independent with activities of daily living, and practicing adequate hygiene routine.     Safety maintained per unit protocol.     Scheduled Medications  ??? amLODIPine  10 mg Oral Daily 0900   ??? atorvastatin  20 mg Oral Nightly (2100)   ??? benztropine  1 mg Oral Nightly (2100)   ??? divalproex  500 mg Oral Daily 0900   ??? QUEtiapine  150 mg Oral QAM    Or   ??? haloperidol lactate  5 mg Intramuscular QAM   ??? QUEtiapine  400 mg Oral Nightly (2100)    Or   ??? haloperidol lactate  5 mg Intramuscular Nightly (2100)   ??? hydroCHLOROthiazide  25 mg Oral Daily 0900   ??? loratadine  10 mg Oral Daily 0900   ??? metFORMIN  500 mg Oral Daily with breakfast   ??? neomycin-bacitracnZn-polymyxnB  1 packet Topical BIDN   ??? terazosin  10 mg Oral Nightly (2100)       Vitals:    01/12/20 0821 01/13/20 0850 01/13/20 2111 01/14/20 0809   BP: (!) 162/99 159/83 (!) 153/101 (!) 181/96   BP Location: Left arm Left arm Left arm Right arm   Patient Position: Sitting Sitting Sitting Sitting   Pulse: 109 113 100 102   Resp: 17 18 18 18    Temp:  98 ??F (36.7 ??C)  98 ??F (36.7 ??C)   TempSrc:  Oral  Oral   SpO2: 100% 99% 100% 100%

## 2020-01-14 NOTE — Unmapped (Signed)
Occupational Therapy/Therapeutic Recreation Group Note      Group: TR  Attendance: Attended  30/30 minutes  Level of Participation: Active, Interacted with peer(s), Interacted with staff, Initiated Comment(s) and Followed Directions    Pt readily joined pm group session. Pt engaged in game of [employer] game x2. Pt played with therapist for one round and therapist and peer for one round. Pt able to maintain focus 75% and display mod strategy in game. Pt was social and calm. Stated he loved groups and wanted to know about group schedule for tomorrow.
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
## 2020-01-14 NOTE — Unmapped (Signed)
Occupational Therapy/Therapeutic Recreation Group Note      Group: Customer service manager  Attendance: Attended  45/45 minutes  Level of Participation: Moderate, Interacted with peer(s), Interacted with staff and Followed Directions    Pt readily attended group this PM, appropriate ADLs, constricted affect. Pt engaged in All About Me collage and focus ~75% able to search magazines for appropriate items in each category. Pt IDed appropriate ways to relax and things that make him smile such as watching sports. Pt less pressured than previous interactions, calm and cooperative throughout. No management issues noted.

## 2020-01-15 MED ORDER — ARIPiprazole (ABILIFY MAINTENA) 400 mg SSRR
400 | INTRAMUSCULAR | 0 refills | Status: AC
Start: 2020-01-15 — End: 2020-01-17

## 2020-01-15 MED ORDER — lamoTRIgine (LAMICTAL) tablet 25 mg
25 | Freq: Every day | ORAL | Status: AC
Start: 2020-01-15 — End: 2020-01-17
  Administered 2020-01-15 – 2020-01-17 (×3): 25 mg via ORAL

## 2020-01-15 MED ORDER — QUEtiapine (SEROQUEL) tablet 250 mg
200 | Freq: Every morning | ORAL | Status: AC
Start: 2020-01-15 — End: 2020-01-17
  Administered 2020-01-16 – 2020-01-17 (×2): 250 mg via ORAL

## 2020-01-15 MED ORDER — haloperidol lactate (HALDOL) injection 5 mg
5 | Freq: Every morning | INTRAMUSCULAR | Status: AC
Start: 2020-01-15 — End: 2020-01-17

## 2020-01-15 MED FILL — TRIPLE ANTIBIOTIC 3.5 MG-400 UNIT-5,000 UNIT TOPICAL OINTMENT PACKET: 3.5-400-5000 3.5-400-5,000 mg-unit-unit | TOPICAL | Qty: 1

## 2020-01-15 MED FILL — LORAZEPAM 1 MG TABLET: 1 1 MG | ORAL | Qty: 1

## 2020-01-15 MED FILL — AMLODIPINE 10 MG TABLET: 10 10 MG | ORAL | Qty: 1

## 2020-01-15 MED FILL — CALCIUM ANTACID 200 MG CALCIUM (500 MG) CHEWABLE TABLET: 200 200 mg calcium (500 mg) | ORAL | Qty: 1

## 2020-01-15 MED FILL — BENZTROPINE 1 MG TABLET: 1 1 MG | ORAL | Qty: 1

## 2020-01-15 MED FILL — SEROQUEL 50 MG TABLET: 50 50 mg | ORAL | Qty: 1

## 2020-01-15 MED FILL — MELATONIN 3 MG TABLET: 3 3 mg | ORAL | Qty: 1

## 2020-01-15 MED FILL — LAMOTRIGINE 25 MG TABLET: 25 25 MG | ORAL | Qty: 1

## 2020-01-15 MED FILL — SEROQUEL 400 MG TABLET: 400 400 mg | ORAL | Qty: 1

## 2020-01-15 MED FILL — HYDROCHLOROTHIAZIDE 25 MG TABLET: 25 25 MG | ORAL | Qty: 1

## 2020-01-15 MED FILL — DEPAKOTE 500 MG TABLET,DELAYED RELEASE: 500 500 mg | ORAL | Qty: 1

## 2020-01-15 MED FILL — TERAZOSIN 5 MG CAPSULE: 5 5 MG | ORAL | Qty: 2

## 2020-01-15 MED FILL — METFORMIN ER 500 MG TABLET,EXTENDED RELEASE 24 HR: 500 500 MG | ORAL | Qty: 1

## 2020-01-15 MED FILL — LORATADINE 10 MG TABLET: 10 10 mg | ORAL | Qty: 1

## 2020-01-15 MED FILL — ATORVASTATIN 20 MG TABLET: 20 20 MG | ORAL | Qty: 1

## 2020-01-15 NOTE — Unmapped (Signed)
Daily Progress Note  [facility] Psychiatric Services at [facility]       Chief Complaint:     Amiri Riechers is a 45 y.o. Black or African American male who was admitted to the psychiatric inpatient unit on 01/05/2020 at10:59 AM for Manic Behavior. He is currently on Hospital Day #9 and is in room 5168/[id number]. Patient appears to still be exhibiting signs of mania with increased prosody of speech, and grandiose ideation of success. Patient is however pleasant and participating in group.    Location: mood / psychosis / anxiety  Duration: acute (< 14 days)  Severity: moderate  Timing: constant, improving  Context: stress / intoxication / Rx-noncompliance  Aggravating Factors: homelessness / unemployment / substance / Rx non-compliance      Subjective     I reviewed charting, discussed case with staff, and met with Mr. Kluver on the ward. Over the last 24 hours, Mr. Carchi has not not required PRN agitation meds. Per staff, he has been socializing with peers and staff and has been attending scheduled groups, however RN reported one aggressive episode concerning the name board in the common area.    Per chart, patient has been refusing Depakote. Pt says he prefers Lamictal and has been stable on this in the past. [facility] indicates last fill in 10/21 of 1 month supply, so pt likely hasn't been on for months.    Mr. Blount reports that he has been eating and sleeping well. He reports that his sleep was helped by the melatonin, Seroquel, and ativan. Today, speech less pressured though rate remains elevated. 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 Does not have a back-up plan if he were to run out of money, saying God will provide. Claims he could crash at his sister's place in [name] if need-be, but  he'd rather sleep in his car. Also plans to get back together with his ex-wife in [place] and help raise her/their kids, yet also speaks of traveling to [place]. Regarding meds, still feels like he did well on LAI [name], though uncertain of his last injection and doesn't have a clear plan as to where he would get follow-up injections. When asked about this, he replied, How do you think President Biden and VP Eldridge Dace get their medications and injections when they're traveling all over the world? He's agreeable to increase in Seroquel for now, though begrudgingly. He denies suicidal ideation as well as homicidal ideation. He also denies auditory and visual hallucinations.    Left message with outpt psych provider NP Toy Cookey in [place], [place] to further discuss outpatient med regimen. Awaiting call back.    Psychiatric Review of Symptoms:     Depression: None Reported   Mania: distractible, impulse, grandiose. flight of ideas, activity increased, sleep decreased, talkative  Psychosis: auditory hallucinations, visual hallucinations, paranoia  Anxiety: None Reported    Review of Systems     Denies headache, nausea, vomiting, blurry vision, chest pain, SOB, abdominal pain, diarrhea, constipation, dysuria, dizziness, new rash.     Endorsing stiff neck.    Current Medications Ordered:     ??? amLODIPine  10 mg Oral Daily 0900   ??? atorvastatin  20 mg Oral Nightly (2100)   ??? benztropine  1 mg Oral Nightly (2100)   ???  divalproex  500 mg Oral Daily 0900   ??? QUEtiapine  150 mg Oral QAM    Or   ??? haloperidol lactate  5 mg Intramuscular QAM   ??? QUEtiapine  400 mg Oral Nightly (2100)    Or   ??? haloperidol lactate  5 mg Intramuscular Nightly (2100)   ??? hydroCHLOROthiazide  25 mg Oral Daily 0900   ??? loratadine  10 mg Oral Daily 0900   ??? metFORMIN  500 mg Oral Daily with breakfast   ??? neomycin-bacitracnZn-polymyxnB  1 packet Topical BIDN   ??? terazosin  10 mg Oral Nightly (2100)       In addition to the scheduled  medications listed above, the following medications are ordered PRN: acetaminophen **OR** acetaminophen, calcium carbonate, LORazepam, LORazepam, magnesium hydroxide, melatonin, psyllium husk (aspartame), risperiDONE M-TAB **OR** risperiDONE M-TAB, ziprasidone **AND** sterile water.      Vitals:     Vitals:    01/13/20 2111 01/14/20 0809 01/14/20 2047 01/15/20 0802   BP: (!) 153/101 (!) 181/96 (!) 141/98 (!) 167/102   BP Location: Left arm Right arm  Right arm   Patient Position: Sitting Sitting  Sitting   Pulse: 100 102  96   Resp: 18 18  18    Temp:  98 ??F (36.7 ??C)  97.4 ??F (36.3 ??C)   TempSrc:  Oral  Oral   SpO2: 100% 100%  100%         Physical Exam     Constitutional:He is well-developed, well-nourished, and in no acute distress.    Head: Normocephalic and atraumatic.   Eyes: EOM are normal.   Neck: Normal range of motion, however reports stiffness  Pulmonary/Chest: Effort normal. No respiratory distress.   Musculoskeletal: Normal range of motion, no stiffness in extremities.     Mental Status Exam:     Appearance: appears stated age, well developed, well nourished, approprite attire and appropriate grooming, adequte hygiene, normal eye contact, cooperative with examiner.  Behavior/Movement: No PMR/PMA  Speech: increased rate, regular rhythm, tone and prosody.  Naming and repeating intact.  No aphasia. Production increased. Fluent and mildly pressured, but improvintg.  Mood: OK  Affect: euphoric, mood congruent, content appropriate   Thought Process: Organized, linear without flight of ideas or loosening of associations.  Thought Content: Grandiosity,   Perceptions: Not seen responding to internal stimuli.    Cognition:   -Sensorium: alert   -Memory: recent and remote intact as evidenced by historical recall of verifiable facts from patient's personal history.    -Fund Of Knowledge: appears consistent with average intellect based on vocabulary, level of function, education.   -Abstraction: Attention and  concentration intact   -Insight: partial - illness and need for treatment but minimizing impact illness has on functioning   -Judgement: fair      Labs and Imaging:     I reviewed Mr. Dreier's labs, diagnostic tests, and imaging. Please refer to the EMR for a record of these.      Assessment and Plan:     Mr. Darnell is a 45 y.o. Black or African American male admitted on 01/05/2020 for acute mania with psychotic features. Patient is on hospital day 9.  Overall, patient's psychiatric symptoms are Minimally improved demonstrated by patients relatively slower prosody of speech and ability to sleep, however he still exhibits grandiose ideation.    Psychiatric Diagnoses: Bipolar 1, acute decompensated mania    Psychiatric:  1. Safety: Continue to require inpatient psychiatry as the least restrictive means to  ensure safety and stabilization.      2.  Psychopharmacology   - Team will continue to optimize patients med regimen. Patient has indicated desire to continue [name] Maintena, looking into options to continue this medication   -Pt refused Depakote and prefers lamictal. Will d/c depakote and re-start lamictal   - Given unclear compliance over past several months, re-starting at Lamictal 25mg  Daily with plan to uptitrate as tolerated with increase every 2 weeks until reaching prior dose of 200mg  daily   - Increasing AM dose of Seroquel to 250mg  QAM. Cont 400mg  QHS.   - continue benzotropine 1mg  QHS for side effect reduction   - Monitor mood, behavior, sleep, appetite.   - Encourage participation in groups and other therapeutic activities on the ward as appropriate.   - Continue to provide a safe and structured environment.    Medical:  HTN: BP's elevated while on unit. Pt asx, though refused recent HCTZ  - continue home amlodipine 10mg  daily  - continue home HCTZ 25mg  daily  - continue home terazosin 10mg  QHS  - CTM and adjust meds PRN      Social: SW consulted, appreciate their assistance with current needs, collateral  contact and discharge planning.      Discharge:   -Collaborate with SW and [employer] to gather collateral and determine appropriate community support and disposition.     Patient seen and plan discussed and agreed upon by attending physician    Note partially dictated by:  Gardiner Coins, MS3    Reviewed/edited by  Jimmy Footman, MD, PhD  PGY-1, Psychiatry

## 2020-01-15 NOTE — Unmapped (Signed)
[id number]    Patient sleeping. Breathing regular, easy, unlabored. No Scheduled medication. No PRNs administered. Safety maintained per unit protocol.

## 2020-01-15 NOTE — Unmapped (Signed)
Occupational Therapy/Therapeutic Recreation Progress Note      Nathaniel Maxwell  [id number]      Group: Task  Attendance: Attended  60/60 minutes  Level of Participation: Active, Interacted with staff, Remained on Task/Topic/Number of Minutes: 60 and Followed Directions    Pt attended group with invite.  Therapist asked how his day was going, pt recognized earlier events and stated not good and stated about an argument with the staff.  Pt reports he will move on  Pt asked to paint a wooden task.  Talkative with pressured speech while working.  His work was organized but he worked quickly.  He did state a plan to let the project dry then add another coat.  Pt then asked staff to play cards with him.   He learned a new game quickly, played as directed. No bx concerns in group.      Candise Crabtree, CTRS  01/15/2020 1:12 PM

## 2020-01-15 NOTE — Unmapped (Signed)
Occupational Therapy/Therapeutic Recreation Group Note      Group: Health Management  Attendance: Attended  20/20 minutes  Level of Participation: Active, Interacted with peer(s), Interacted with staff, Initiated Comment(s) and Followed Directions    Patient presented to group with appropriate affect and ADLs. Patient completed hand hygiene and followed directions well in group. Patient engaged in group chair stretches with good effort and focus throughout. Patient attempted assisting staff with radio and was kid throughout. Patient remained calm, pleasant, and cooperative throughout task.

## 2020-01-15 NOTE — Unmapped (Signed)
Spoke with outpt psych provider NP Toy Cookey in [place], [place]:    Hasn't seen [facility] since October. Seroquel decreased because he was doing well on [name] LAI. Last visit was 12/13/19 only for 400mg  [name] Maintenna. Should have been taking PO Seroquel 100mg , lamictal 200mg , + congentin. Has been on LAI [name] for a while and stable per chart. Often talkative with elevated mood but not overtly manic. Possible he was getting cost of LAI covered through a special program in their county, but she's not sure.    She is agreeable with our current med management and discharging on only PO medications if necessary. She would like D/C summary faxed to their office when ready. Fax: [phone]    Jimmy Footman, MD, PhD  PGY-1, Pscychiatry

## 2020-01-15 NOTE — Unmapped (Signed)
Problem: Major Thought Disorder  Goal: DC Criteria-Thought D/O won't neg. affect daily functioning  Outcome: Progressing  Note: Patient is highly visible on unit. Calm, pleasant and cooperative when he can get his way. Argumentative and hostile when redirected or limits are set. Became loud and threatening after being asked to move away from white board in hallway. Argued with staff that he could stand wherever he wants. Security involved. Patient was asked to return to his room to calm down. Argued for awhile but eventually complied. Later returned to dining room calm and apologized to Clinical research associate for his behavior. Denies suicidal/homicidal ideation, denies visual/auditory hallucinations. Speech remains rapid. Patient is grandiose stating that he is the king of this place and these b*tches will have to respect me. This is my home. Patient asked to keep language appropriate on unit. No further behaviors noted. Q15 minutes safety checks in place per unit protocol.    Vitals:    01/15/20 0802   BP: (!) 167/102   Pulse: 96   Resp: 18   Temp: 97.4 ??F (36.3 ??C)   SpO2: 100%         Problem: Major Mood Disorder  Goal: DC Criteria-Mania/depression won't neg affect daily function  Outcome: Progressing     Problem: Potential for Harm to Self or Others  Goal: DC Criteria- Patient will not present active danger to self  Description: patient will not present an active danger to themselves due to a psych  illness  Outcome: Progressing     Problem: Potential for Harm to Self or Others  Goal: DC Criteria-Pt will not present an active danger to others  Outcome: Progressing     Problem: Other medical condition  Goal: DC Criteria-Pt will stay medically stable for F/u in OP  Outcome: Progressing     Problem: Unable to care for basic needs  Goal: DC Criteria-Pt will utilize gestures to indicate wants/needs  Outcome: Progressing

## 2020-01-15 NOTE — Unmapped (Signed)
Occupational Therapy/Therapeutic Recreation Group Note      Group: Network engineer: Attended  50/50 minutes  Level of Participation: Active, Interacted with peer(s), Interacted with staff and Followed Directions    Pt readily attended group this PM, appropriate ADLs and affect. Pt participated in group juggle, human pool and gratitude activity. Pt with pressured and rapid speech, was easily redirectable when talking over others. Pt stated 'did you hear what happened earlier?' and continued to speak of frustrations on the milieu. Pt left group and stated 'time to get an ativan so I can sleep' and when encouraged to only seek PRNs for when he truly benefits from them he stated 'okay I feel anxious then.' No management issues noted.

## 2020-01-16 MED ORDER — hydroCHLOROthiazide (HYDRODIURIL) tablet 50 mg
25 | Freq: Every day | ORAL | Status: AC
Start: 2020-01-16 — End: 2020-01-17
  Administered 2020-01-17: 15:00:00 50 mg via ORAL

## 2020-01-16 MED ORDER — LORazepam (ATIVAN) tablet 0.5 mg
0.5 | Freq: Three times a day (TID) | ORAL | Status: AC | PRN
Start: 2020-01-16 — End: 2020-01-17
  Administered 2020-01-17 (×3): 0.5 mg via ORAL

## 2020-01-16 MED ORDER — pantoprazole (PROTONIX) EC tablet 40 mg
40 | Freq: Every day | ORAL | Status: AC
Start: 2020-01-16 — End: 2020-01-17
  Administered 2020-01-16 – 2020-01-17 (×2): 40 mg via ORAL

## 2020-01-16 MED FILL — PANTOPRAZOLE 40 MG TABLET,DELAYED RELEASE: 40 40 MG | ORAL | Qty: 1

## 2020-01-16 MED FILL — SEROQUEL 400 MG TABLET: 400 400 mg | ORAL | Qty: 1

## 2020-01-16 MED FILL — AMLODIPINE 10 MG TABLET: 10 10 MG | ORAL | Qty: 1

## 2020-01-16 MED FILL — TRIPLE ANTIBIOTIC 3.5 MG-400 UNIT-5,000 UNIT TOPICAL OINTMENT PACKET: 3.5-400-5000 3.5-400-5,000 mg-unit-unit | TOPICAL | Qty: 1

## 2020-01-16 MED FILL — ATORVASTATIN 20 MG TABLET: 20 20 MG | ORAL | Qty: 1

## 2020-01-16 MED FILL — LAMOTRIGINE 25 MG TABLET: 25 25 MG | ORAL | Qty: 1

## 2020-01-16 MED FILL — BENZTROPINE 1 MG TABLET: 1 1 MG | ORAL | Qty: 1

## 2020-01-16 MED FILL — LORATADINE 10 MG TABLET: 10 10 mg | ORAL | Qty: 1

## 2020-01-16 MED FILL — CALCIUM ANTACID 200 MG CALCIUM (500 MG) CHEWABLE TABLET: 200 200 mg calcium (500 mg) | ORAL | Qty: 1

## 2020-01-16 MED FILL — SEROQUEL 50 MG TABLET: 50 50 mg | ORAL | Qty: 1

## 2020-01-16 MED FILL — METFORMIN ER 500 MG TABLET,EXTENDED RELEASE 24 HR: 500 500 MG | ORAL | Qty: 1

## 2020-01-16 MED FILL — TERAZOSIN 5 MG CAPSULE: 5 5 MG | ORAL | Qty: 2

## 2020-01-16 MED FILL — MELATONIN 3 MG TABLET: 3 3 mg | ORAL | Qty: 1

## 2020-01-16 MED FILL — HYDROCHLOROTHIAZIDE 25 MG TABLET: 25 25 MG | ORAL | Qty: 1

## 2020-01-16 NOTE — Unmapped (Signed)
Patient visible on the unit at intervals. Patient is medication compliant with evening medications. PRN Ativan 0.5 mg given for c/o anxiety and Melatonin 3 mg for sleeplessness with good effects. Patient ate dinner and had snacks.  No complaints of pain and no outbursts this shift. Safety measures in place per unit protocol.

## 2020-01-16 NOTE — Unmapped (Addendum)
Assumed care at 1930. Patient out on the unit, social with peers and staff, cracking appropriate jokes. He is pleasant during interaction, calm and cooperative. No behavioral concerns. Compliant with scheduled medications.  BP checked prior to administering Hytrin, 163/103 P 94. Denies symptoms. Rechecked a little over an hour later, 158/94 P 98, continues to deny symptoms. He did c/o anxiety, indigestion, and trouble sleeping. PRN Ativan 1 mg, Tums 500 mg, Melatonin 3 mg administered at 2111. States indigestion is not new. Followed up an hour later, he reports min., effectiveness. He denies suicidal/homicidal ideations and auditory/visual hallucinations. Po intake good. Continent of bowel and bladder with no complaints. Encouraged to notify staff of any needs/concerns. Safety checks in place. Safety maintained.

## 2020-01-16 NOTE — Unmapped (Signed)
Occupational Therapy/Therapeutic Recreation Group Note      Group: Network engineer: Attended  45/45 minutes  Level of Participation: Active, Interacted with peer(s), Interacted with staff and Followed Directions    Pt readily attended group this PM, appropriate ADLs and affect. Pt spoke of feeling tired and observed closing his eyes at times. Pt able to complete organized watercolor task, focus ~75% but hyper-verbal. Pt was polite with other peers, asked peers appropriate questions throughout. No management issues noted.

## 2020-01-16 NOTE — Unmapped (Signed)
Occupational Therapy/Therapeutic Recreation Progress Note      Nathaniel Maxwell  [id number]      Group: Task   Attendance: Attended  60/60 minutes  Level of Participation: Active, Interacted with staff and Followed Directions    Pt was eager to attended group, mood bright. Pt remembered previous task of painting and asking to add another coat.  Pt did so quickly then asked to play cards/Rummy as he did yesterday.  With playing cards pt was distracted by what others were playing vs focusing more on what he was playing.  Pt also was hyperverbal.     Tashawn Greff, CTRS  01/16/2020 3:57 PM

## 2020-01-16 NOTE — Unmapped (Signed)
Patient slept most of the night with no issues. He was visible on the unit, talking to staff. He c/o roommate being irritating, stating roommate asked him to get hygiene supplies. He states that he told roommate to get his own shit. He states that he was getting ready to take a shower and roommate stated that he was going to shower. Patient states that this made him mad and that he felt like dragging him out of the shower and beating him up. He asked about staffing on the unit and that he wants to Designer, multimedia. Encouraged not to do this, told him that if he has a problem with anyone to notify staff. No medications scheduled. No PRN medications administered. Safety maintained.

## 2020-01-16 NOTE — Unmapped (Signed)
Provided patient with Catholic prayers.  No further action needed at this time.  Rev. Ronald Slayton Lubitz, D.Min

## 2020-01-16 NOTE — Unmapped (Signed)
Problem: Major Mood Disorder  Goal: DC Criteria-Mania/depression won't neg affect daily function  Description: Social work will assess and follow patient during this admission.  Outcome: Progressing  Note:     Social worker called patient's sister, Nathaniel Maxwell [phone] and her husband Nathaniel Maxwell [phone], with patient's permission to get collateral information. He was visiting Impulsivity brought him back to [name]. He was in the hospital in [place] in the last year. He has been off and on his medication. He has hypomanic and not sleeping when in [place]. He had started to become worse. On [name], he text them that he was coming up to visit. He was appropriate with family, but they could tell he was having a manic episode by his mannerisms. He played board games with their kids right before he decided to go into the hospital. He is near baseline. He is able to mask his symptoms around the kids. He went to a hotel after the visit and didn't sleep. He appeared pressured. He even went to their [name] and people could tell he was not stable. The fianc?? is a new. They met her during Thanksgiving. They may have met in the hospital in [name], but they are not sure. He was staying at his mother's house in [name]. She also has a home in [name], where she lives with her husband. He was living in the [name] in his mother's house by himself. 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 They have actually asked him back this week.    Nathaniel Maxwell visited with patient this weekend and he feels he is okay, but still a little manic. He goes through phases. 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 They worry about this sleep. He is still getting up at night. He is not really connected to services in [place], but says he has a team. He was on the [name] in [place] previously.     In 2020, when  he was discharged from [name] inpatient, he wanted to stay in [name] and build a life for himself. He stayed in [name] for a little while and then just become impulsive and drove down to [place] with his mother.     Nathaniel Maxwell visited with him tonight. She thinks he has calm down some, but shows some symptoms of mania. She would like the team to contact patient's team in [place] to explore medications and the long acting Abiify injection.     Of note, patient doesn't like authority and can react negatively to it. No matter at home or in the community. He responds better to male officers and staff unless they are ordering him to do something he doesn't want to do.

## 2020-01-16 NOTE — Unmapped (Signed)
Daily Progress Note  [facility] Psychiatric Services at [facility]       Chief Complaint:     Nathaniel Maxwell is a 45 y.o. Black or African American male who was admitted to the psychiatric inpatient unit on 01/05/2020 at10:59 AM for Manic Behavior. He is currently on Hospital Day #10 and is in room 5168/[id number].    Location: mood / psychosis / anxiety  Duration: acute (< 14 days)  Severity: moderate  Timing: constant  Context: stress / intoxication / Rx-noncompliance  Aggravating Factors: homelessness / unemployment / substance / Rx non-compliance      Subjective     I reviewed charting, discussed case with staff, and met with Mr. Wagar on the ward. Over the last 24 hours, Mr. Sperling has not required PRN agitation meds. Per staff, he has been socializing with peers and staff and has been attending scheduled groups.     Today patient was pleasant with medical team, however RN note indicated that patient states that... he felt like dragging him [roommate] out of the shower and beating him up. 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 Patient has previously spoken highly of his [name] Maintena injections (which may have been funded through a program by his county of residence in [place]), and states that the $800 co-pay if he were to receive them here is not a problem - the team is unsure about the reality of this claim. Patient also indicates intentions to travel back and forth from [facility] to [place] by plane, but mostly live in [place] and follow-up with his outpt provider there.    Patient's rate of speech today seems to be slightly increased, yet less pressured than yesterday. He has insight into his illness, indicating a desire to be stable.    Mr. Holzhauer reports that his mood as good. He has been eating and sleeping well.     Psychiatric Review of Systems:     Depression: None Reported   Mania: Not  distractible, impulsive. Some grandiosity. No flight of ideas, activity increased, sleep decreased. Remains talkative though improving  Psychosis: No auditory hallucinations, visual hallucinations, or paranoia  Anxiety: mild anxiety reported per nursing    Review of Systems     Denies headache, nausea, vomiting, blurry vision, chest pain, SOB, abdominal pain, diarrhea, constipation, dysuria, dizziness, new rash.     Endorses continued L-sided posterior neck pain of MSK nature    Current Medications Ordered:     ??? amLODIPine  10 mg Oral Daily 0900   ??? atorvastatin  20 mg Oral Nightly (2100)   ??? benztropine  1 mg Oral Nightly (2100)   ??? QUEtiapine  400 mg Oral Nightly (2100)    Or   ??? haloperidol lactate  5 mg Intramuscular Nightly (2100)   ??? QUEtiapine  250 mg Oral QAM    Or   ??? haloperidol lactate  5 mg Intramuscular QAM   ??? hydroCHLOROthiazide  25 mg Oral Daily 0900   ??? lamoTRIgine  25 mg Oral Daily 0900   ??? loratadine  10 mg Oral Daily 0900   ??? metFORMIN  500 mg Oral Daily with breakfast   ??? neomycin-bacitracnZn-polymyxnB  1 packet Topical BIDN   ??? pantoprazole  40 mg Oral DAILY 0600   ??? terazosin  10 mg Oral Nightly (2100)       In addition to the scheduled medications listed above,  the following medications are ordered PRN: acetaminophen **OR** acetaminophen, calcium carbonate, LORazepam, LORazepam, magnesium hydroxide, melatonin, psyllium husk (aspartame), risperiDONE M-TAB **OR** risperiDONE M-TAB, ziprasidone **AND** sterile water.      Vitals:     Vitals:    01/15/20 0802 01/15/20 1659 01/15/20 2123 01/15/20 2220   BP: (!) 167/102 (!) 154/118 (!) 163/103 (!) 158/94   BP Location: Right arm Right arm Left arm Left arm   Patient Position: Sitting Sitting Sitting Sitting   Pulse: 96 110 94 98   Resp: 18 16     Temp: 97.4 ??F (36.3 ??C) 98.1 ??F (36.7 ??C)     TempSrc: Oral Oral     SpO2: 100% 100%           Physical Exam     Constitutional:He is well-developed, well-nourished, and in no acute distress.    Head:  Normocephalic and atraumatic.   Eyes: EOM are normal.   Neck: unilateral stiffness of left side of neck reported, though full ROM   Pulmonary/Chest: Effort normal. He is not intubated. No respiratory distress.   Musculoskeletal: Normal range of motion, no stiffness/cogwheeling noted in limbs.  Neurological: He is alert and oriented to person, place, and time.   Skin: Skin is dry and intact. No rash noted.     Mental Status Exam:     Appearance: appears stated age, well developed, well nourished, appropriate attire and grooming, good hygiene,normal eye contact, cooperative with examiner.  Behavior/Movement: No PMR/PMA  Speech: regular rate, rhythm, tone and prosody.  Naming and repeating intact.  No aphasia. Production slightly increased. Fluent and nonpressured.  Mood: good  Affect: mildly euphoric, full range, mood congruent, content appropriate   Thought Process: organized and linear without any flight of ideas or loosening of associations.  Thought Content: Grandiose ideation noted (see HPI)  Perceptions: Not seen responding to internal stimuli.    Cognition:   -Sensorium: alert   -Memory: recent and remote intact as evidenced by historical recall of verifiable facts from patient's personal history.    -Fund Of Knowledge: appears consistent with average intellect based on vocabulary, level of function, education.   -Abstraction: Attention and concentration intact   -Insight: partial - illness and need for treatment but minimizing impact illness has on functioning   -Judgement: fair      Labs and Imaging:     I reviewed Mr. Mundorf's labs, diagnostic tests, and imaging. Please refer to the EMR for a record of these.    Assessment and Plan:     Mr. Mounce is a 45 y.o. Black or African American male admitted on 01/05/2020 for acute mania with psychotic features. Patient is on hospital day 10.  Overall, patient's psychiatric symptoms are Moderately improved demonstrated by patients relatively slower prosody of speech and  ability to sleep, however he still exhibits grandiose ideation.    Psychiatric Diagnoses: Bipolar 1, acute decompensated mania  Acute Medical Issues: n/a    Psychiatric:  1. Safety: Continue to require inpatient psychiatry as the least restrictive means to ensure safety and stabilization.      2. Psychopharmacology   - Team will continue to optimize patient's meds. Patient has indicated desire to continue [name] Maintena, looking into options to continue this medication, however may be cost prohibitive.    - Continue Lamictal 25mg  daily with plan to uptitrate to 200mg , increasing dose every 2 weeks. Depakote d/c'd and pt did not prefer.   - Cont Seroquel 250mg  QAM and 400mg  QHS   -  Continue Benztropine 1mg  QHS for side effect reduction   - decrease PRN ativan to 0.5mg  Q8H for anxiety to reduce risk of developing dependency   - Monitor mood, behavior, sleep, appetite   - Encourage participation in groups and other therapeutic activities on the ward as appropriate.   - Continue to provide a safe and structured environment.    4. Substance Abuse/Dependence/Withdrawal: no active issues    Medical:   HTN: BP has been elevated on unit. Pt asx, but today agreed to increased dose of HCTZ  - Continue home amlodipine 10mg  daily  - increase HCTZ to 50mg  daily  - discontinue home terazosin 10mg  QHS  - CTM and adjust meds PRN    Social: SW consulted, appreciate their assistance with current needs, collateral contact and discharge planning.      Discharge:   -Collaborate with SW and [employer] to gather collateral and determine appropriate community support and disposition.    Patient seen and plan discussed and agreed upon by attending physician    Note partially dictated by:  Gardiner Coins, MS3  ??  Reviewed/edited by  Jimmy Footman, MD, PhD  PGY-1, Psychiatry

## 2020-01-16 NOTE — Unmapped (Signed)
Problem: Major Thought Disorder  Goal: DC Criteria-Thought D/O won't neg. affect daily functioning  Outcome: Progressing  Patient visible on the unit pacing at times. Denies suicidal/homicidal ideations and auditory/visual hallucinations.Patient continues to make multiple requests for different things. Compliant with his medications. Appetite is good. Appearance is unkempt but patient stated that he took a shower this date. Encouraged to attend groups. No unsafe behaviors noted. Safety measures in place per unit protocol.     Vitals:    01/15/20 2123 01/15/20 2220   BP: (!) 163/103 (!) 158/94   Pulse: 94 98   Resp:     Temp:     SpO2:       ??? amLODIPine  10 mg Oral Daily 0900   ??? atorvastatin  20 mg Oral Nightly (2100)   ??? benztropine  1 mg Oral Nightly (2100)   ??? QUEtiapine  400 mg Oral Nightly (2100)    Or   ??? haloperidol lactate  5 mg Intramuscular Nightly (2100)   ??? QUEtiapine  250 mg Oral QAM    Or   ??? haloperidol lactate  5 mg Intramuscular QAM   ??? [START ON 01/17/2020] hydroCHLOROthiazide  50 mg Oral Daily 0900   ??? lamoTRIgine  25 mg Oral Daily 0900   ??? loratadine  10 mg Oral Daily 0900   ??? metFORMIN  500 mg Oral Daily with breakfast   ??? neomycin-bacitracnZn-polymyxnB  1 packet Topical BIDN   ??? pantoprazole  40 mg Oral DAILY 0600

## 2020-01-16 NOTE — Unmapped (Signed)
Occupational Therapy/Therapeutic Recreation Group Note        Group: Health Management  Attendance: Attended  30/30 minutes  Level of Participation: Active, Interacted with peer(s), Interacted with staff, Initiated Comment(s) and Followed Directions    Patient presented to group with appropriate affect and ADLs. Patient introduced self and favorite thing to start his morning is taking a shower. Patient engaged in progressive muscle relaxation (PMR) exercises with good focus and effort throughout. Patient engaged in group A-Z ball toss with good focus and responses provided throughout. Patient's mood and affect brighter throughout as activity progressed AEB smiling and laughing throughout. Following stretches patient stated, those stretches made my neck feel way better. I'm about to sleep because they are so relaxing. Patient remained appropriate throughout group with no management issues this date. Provided patient with PMR stretches printout - patient appreciative for handouts. Patient appropriately sociable with staff and peers. Calm and cooperative throughout group.

## 2020-01-17 MED ORDER — QUEtiapine (SEROQUEL) 400 MG tablet
400 | ORAL_TABLET | Freq: Every evening | ORAL | 1 refills | Status: AC
Start: 2020-01-17 — End: ?

## 2020-01-17 MED ORDER — lamoTRIgine (LAMICTAL) 25 MG tablet
25 | ORAL_TABLET | Freq: Every day | ORAL | 0 refills | Status: AC
Start: 2020-01-17 — End: 2020-01-17

## 2020-01-17 MED ORDER — QUEtiapine (SEROQUEL) 400 MG tablet
400 | ORAL_TABLET | Freq: Every evening | ORAL | 1 refills | Status: AC
Start: 2020-01-17 — End: 2020-01-17

## 2020-01-17 MED ORDER — QUEtiapine (SEROQUEL) 200 MG tablet
200 | ORAL_TABLET | Freq: Every morning | ORAL | 1 refills | Status: AC
Start: 2020-01-17 — End: 2020-01-17

## 2020-01-17 MED ORDER — QUEtiapine (SEROQUEL) 200 MG tablet
200 | ORAL_TABLET | Freq: Every morning | ORAL | 1 refills | Status: AC
Start: 2020-01-17 — End: ?

## 2020-01-17 MED ORDER — lamoTRIgine (LAMICTAL) 25 MG tablet
25 | ORAL_TABLET | Freq: Every day | ORAL | 0 refills | Status: AC
Start: 2020-01-17 — End: ?

## 2020-01-17 MED FILL — LORAZEPAM 0.5 MG TABLET: 0.5 0.5 MG | ORAL | Qty: 1

## 2020-01-17 MED FILL — AMLODIPINE 10 MG TABLET: 10 10 MG | ORAL | Qty: 1

## 2020-01-17 MED FILL — SEROQUEL 50 MG TABLET: 50 50 mg | ORAL | Qty: 1

## 2020-01-17 MED FILL — METFORMIN ER 500 MG TABLET,EXTENDED RELEASE 24 HR: 500 500 MG | ORAL | Qty: 1

## 2020-01-17 MED FILL — ATORVASTATIN 20 MG TABLET: 20 20 MG | ORAL | Qty: 1

## 2020-01-17 MED FILL — MELATONIN 3 MG TABLET: 3 3 mg | ORAL | Qty: 1

## 2020-01-17 MED FILL — TRIPLE ANTIBIOTIC 3.5 MG-400 UNIT-5,000 UNIT TOPICAL OINTMENT PACKET: 3.5-400-5000 3.5-400-5,000 mg-unit-unit | TOPICAL | Qty: 1

## 2020-01-17 MED FILL — LORATADINE 10 MG TABLET: 10 10 mg | ORAL | Qty: 1

## 2020-01-17 MED FILL — HYDROCHLOROTHIAZIDE 25 MG TABLET: 25 25 MG | ORAL | Qty: 2

## 2020-01-17 MED FILL — LAMOTRIGINE 25 MG TABLET: 25 25 MG | ORAL | Qty: 1

## 2020-01-17 MED FILL — PANTOPRAZOLE 40 MG TABLET,DELAYED RELEASE: 40 40 MG | ORAL | Qty: 1

## 2020-01-17 NOTE — Unmapped (Signed)
Dear Nathaniel Maxwell,    Thank you for being part of our team!    Please take your medication daily as prescribed, including your long acting injection if your provider supports this.     You will be receiving a call from the [facility] within 1-2 days of discharge. They will be ensuring your needs are being met in the community & assist with any additional resources that may be needed. If you do not hear from anyone, contact their office at [phone].    If you ever feel that you are in a mental health crisis, you can call Crisis Hotline open 24/7 at 513-281-CARE(2273), or the National Suicide Prevention Hotline:  1-800-273-TALK(8255), or contact the [facility] [phone]. If unavailable, please contact Psychiatric Emergency Services at [phone].    Be well and take care,    -[facility] 5th floor treatment team

## 2020-01-17 NOTE — Unmapped (Signed)
Occupational Therapy/Therapeutic Recreation Group Note        Group: Other - Rec Group  Attendance: Attended  40/60 minutes  Level of Participation: Active, Interacted with peer(s), Interacted with staff, Initiated Comment(s) and Followed Directions  ??  Patient presented to group in street clothing and appropriate ADLs secondary to d/c soon following group. Patient requested to engage in a game of UNO. Patient engaged in two rounds of UNO with good focus 75% of the time and good strategy throughout. Patient appropriately joking with friendly banter with staff and peers. Patient left group a couple of times however, was quick upon return. Patient remained appropriate throughout group with no management issues this date. Patient assisted peers with game rules. Patient slightly more restricted and flat in affect however, mood congruent to activity and was noted to laugh and converse appropriately with staff/ peers throughout. Patient calm and cooperative throughout group. Patient thanked staff for group this date.

## 2020-01-17 NOTE — Unmapped (Signed)
Pt has been out on the unit throughout the shift. He has been calm and cooperative upon approach. He was social with peers and staff. Denied all psych symptoms. He family in to speak with the pt. He requested and received Ativan 0.5 mg po prn with good results.     Patient given belongings and discharge instructions, patient verbalized understanding of aftercare plan. Patient denies suicidal and homicidal thoughts. Ambulated off the unit with Clinical research associate and family.
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
## 2020-01-17 NOTE — Unmapped (Signed)
Problem: Major Thought Disorder  Goal: DC Criteria-Thought D/O won't neg. affect daily functioning  Outcome: Adequate for Discharge

## 2020-01-17 NOTE — Unmapped (Signed)
Titrating meds, making slow progress     amLODIPine  10 mg Oral Daily 0900    atorvastatin  20 mg Oral Nightly (2100)    benztropine  1 mg Oral Nightly (2100)    QUEtiapine  400 mg Oral Nightly (2100)    Or    haloperidol lactate  5 mg Intramuscular Nightly (2100)    QUEtiapine  250 mg Oral QAM    Or    haloperidol lactate  5 mg Intramuscular QAM    hydroCHLOROthiazide  50 mg Oral Daily 0900    lamoTRIgine  25 mg Oral Daily 0900    loratadine  10 mg Oral Daily 0900    metFORMIN  500 mg Oral Daily with breakfast    neomycin-bacitracnZn-polymyxnB  1 packet Topical BIDN    pantoprazole  40 mg Oral DAILY 0600

## 2020-01-17 NOTE — Unmapped (Signed)
[id number]    Patient rested in bed with even and unlabored respirations. PRN medications this shift.    Patient slept for approximately 4.25 hours.    No unsafe or inappropriate behaviors observed.   Safety maintained per unit protocol.

## 2020-01-17 NOTE — Unmapped (Signed)
[facility]  Psychiatric Social Work   Discharge Summary Note    Social worker met with patient to discuss discharge planning.  Patient was actively involved the in the discharge plan. Patient denied S/I and H/I.  Patient is in agreement with discharge plan.    Family/Significant Other Communication:  Family/Support Person: Called (Sister is aware of the discharge.)     Social worker called patient's sister, Nathaniel Maxwell [phone] and her husband Nathaniel Maxwell [phone], with patient's permission to get collateral information. Patient's 41 year old son died and sister and brother-in-law came up to see him and inform him of this and then take him home so he can head down to [place].     Discharge Information:  Recommended Treatment: Individual  DC Plan discussed w/ pt/pt representative?: Yes  Discharge Disposition: Relative's home  Discharge Legal Status: N/A      Transition of Care:  Discharge Summary Faxed? ) YES  Date: 01/17/20 Time: 1510 Fax #: [phone] (Fax)  Agency/Provider Name: Shanna Cisco, NP  Contact#: [phone] (Work)    Barriers to Transition of Care: None      Follow-up Appointments:  Shanna Cisco, NP??   [address]   [place], [place] [address]??   [phone] (Work)??   [phone] (Fax)??   On 01/23/2020  @ 8:30 AM.      Discharge Instructions:  ??  Dear Nathaniel Maxwell,  ??  Thank you for being part of our team!  ??  Please take your medication daily as prescribed, including your long acting injection if your provider supports this.   ??  You will be receiving a call from the [facility] within 1-2 days of discharge. They will be ensuring your needs are being met in the community & assist with any additional resources that may be needed. If you do not hear from anyone, contact their office at [phone].  ??  If you ever feel that you are in a mental health crisis, you can call Crisis Hotline open 24/7 at 513-281-CARE(2273), or the National Suicide Prevention Hotline:   1-800-273-TALK(8255), or contact the [employer] (907)146-1721. If unavailable, please contact Psychiatric Emergency Services at [phone].  ??  Be well and take care,  ??  -[facility] 5th floor treatment team   Nolon Bussing [facility], LISW-S  01/17/2020

## 2020-01-17 NOTE — Unmapped (Signed)
Problem: Major Mood Disorder  Goal: DC Criteria-Mania/depression won't neg affect daily function  Description: Social work will assess and follow patient during this admission.  01/17/2020 1446 by Loanne Drilling, LISW-S  Outcome: Completed  Note:   [place]  Psychiatric Social Work   Discharge Summary Note    Social worker met with patient to discuss discharge planning.  Patient was actively involved the in the discharge plan. Patient denied S/I and H/I.  Patient is in agreement with discharge plan.    Family/Significant Other Communication:  Family/Support Person: Called (Sister is aware of the discharge.)     Social worker called patient's sister, Nathaniel Maxwell [phone] and her husband Nathaniel Maxwell [phone], with patient's permission to get collateral information. Patient's 84 year old son died and sister and brother-in-law came up to see him and inform him of this and then take him home so he can head down to [place].     Discharge Information:  Recommended Treatment: Individual  DC Plan discussed w/ pt/pt representative?: Yes  Discharge Disposition: Relative's home  Discharge Legal Status: N/A      Transition of Care:  Discharge Summary Faxed? ) YES  Date: 01/17/20 Time: 1510 Fax #: [phone] (Fax)  Agency/Provider Name: Shanna Cisco, NP  Contact#: [phone] (Work)    Barriers to Transition of Care: None      Follow-up Appointments:  Shanna Cisco, NP    [address]    [place], [place] [address]    [phone] (Work)    [phone] (Fax)    On 01/23/2020  @ 8:30 AM.      Discharge Instructions:     Dear Nathaniel Maxwell,     Thank you for being part of our team!     Please take your medication daily as prescribed, including your long acting injection if your provider supports this.      You will be receiving a call from the [facility] within 1-2 days of discharge. They will be ensuring your needs are being met in the community & assist with any additional resources that may be  needed. If you do not hear from anyone, contact their office at [phone].     If you ever feel that you are in a mental health crisis, you can call Crisis Hotline open 24/7 at 513-281-CARE(2273), or the National Suicide Prevention Hotline:  1-800-273-TALK(8255), or contact the [employer] 7252731357. If unavailable, please contact Psychiatric Emergency Services at [phone].     Be well and take care,     -[facility] 5th floor treatment team   Nolon Bussing [facility], LISW-S  01/17/2020  01/17/2020 1121 by Loanne Drilling, LISW-S  Outcome: Progressing  Note:   One of patient's children, his oldest son (25 year), passed away and they think he should be discharged to get back to [facility]. They don't know how he died. He was positive for COVID, but they are going to do an autopsy. They are

## 2020-01-17 NOTE — Unmapped (Signed)
Occupational Therapy/Therapeutic Recreation Progress Note      Saeed Toren  [id number]      Group: Task  Attendance: Attended  60/60 minutes  Level of Participation: Active, Interacted with staff, Remained on Task/Topic/Number of Minutes: 60 and Followed Directions    Pt attended group with appearing more down in mood today, he reported feeling tired and no right  Pt was agreeable to a new game but as it started he stated it was too boring and slow. He did agree to finish game with a shorten version and pt's focus improved as game played.   When finished pt showed therapist a card game he had mentioned two days ago playing.  With a familiar task pt was more elevated and somewhat unclear with instructions.  As group was finished pt did say he wanted to take an afternoon nap.      Angla Delahunt, CTRS  01/17/2020 3:25 PM

## 2020-01-17 NOTE — Unmapped (Signed)
Pt came up to nurses station stating he is unable to sleep. 0.5 Ativan PO given at 0153 per POD C. Pemberton. Will monitor.

## 2020-01-17 NOTE — Unmapped (Signed)
Occupational Therapy/Therapeutic Recreation Group Note      Group: Health Management  Attendance: Attended  30/30 minutes  Level of Participation: Active, Interacted with staff, Initiated Comment(s) and Followed Directions    Pt readily attended group upon invitation. Pt was only pt in attendance and therefore became a 1:1. Pt completed exercises and cognitive task. Pt reporting he has been having neck and knee pain. Educated on multiple stretches. Verbalized understanding. Pt was pleasant and cooperative throughout.

## 2020-01-22 ENCOUNTER — Ambulatory Visit: Payer: Medicare (Managed Care) | Attending: Clinical

## 2020-01-22 ENCOUNTER — Telehealth (HOSPITAL_COMMUNITY): Payer: Self-pay | Admitting: *Deleted

## 2020-01-22 NOTE — Telephone Encounter (Signed)
VM from patient stating he is back in [place] from [place], just out of a behavioral health hospital and has an appt tomorrow for his shot. We received his discharge paperwork and he is no longer on an injection. Reached out to Del[place] NP his provider as she is out of the office this week, working virtually. She endorsed him staying on is current regimen from the hospital if he is stable and will re eval him next week. Will cx his appt tomorrow and have him walk in next week to see NP and Clinical research associate.

## 2020-01-22 NOTE — Telephone Encounter (Signed)
Called him after speaking with NP re his medicine. He sounds subdued, so much so I asked if I had woke him from a nap. He said he is fine. Currently not taking any medicines, hoping to speak with Dr Doyne Keel to get back on his routine. He reports his 47 yo step son died while he was in the hospital which is why he was discharged when he was and came home. Spoke with Dr Doyne Keel and shared information. She said to give him his shot tomorrow and will review the chart for additional rxs.

## 2020-01-22 NOTE — Unmapped (Addendum)
Therapist spoke with patient via phone. He shared that he is doing okay. He came to hospital to try and receive a refill on his medication but was admitted. He does not live in [place] and is back in [place]. He is calling his doctor now to try to continue with his refill. He will return phone call if there is anything that we can do to assist.     Achille Rich. Senaida Ores, LISW-S

## 2020-01-23 ENCOUNTER — Telehealth (INDEPENDENT_AMBULATORY_CARE_PROVIDER_SITE_OTHER): Payer: Medicare (Managed Care) | Admitting: Psychiatry

## 2020-01-23 ENCOUNTER — Encounter (HOSPITAL_COMMUNITY): Payer: Self-pay | Admitting: Psychiatry

## 2020-01-23 ENCOUNTER — Other Ambulatory Visit: Payer: Self-pay

## 2020-01-23 ENCOUNTER — Ambulatory Visit (INDEPENDENT_AMBULATORY_CARE_PROVIDER_SITE_OTHER): Payer: Medicare (Managed Care) | Admitting: *Deleted

## 2020-01-23 VITALS — BP 138/99 | HR 87 | Ht 67.0 in | Wt 232.5 lb

## 2020-01-23 DIAGNOSIS — F3130 Bipolar disorder, current episode depressed, mild or moderate severity, unspecified: Secondary | ICD-10-CM | POA: Diagnosis not present

## 2020-01-23 MED ORDER — ARIPIPRAZOLE ER 400 MG IM SRER
400.0000 mg | INTRAMUSCULAR | 11 refills | Status: DC
Start: 2020-01-23 — End: 2020-02-12

## 2020-01-23 MED ORDER — LAMOTRIGINE 25 MG PO TABS
25.0000 mg | ORAL_TABLET | Freq: Every day | ORAL | 1 refills | Status: DC
Start: 2020-01-23 — End: 2020-02-12

## 2020-01-23 MED ORDER — MIRTAZAPINE 15 MG PO TABS: 15.0000 mg | ORAL_TABLET | Freq: Every day | ORAL | 2 refills | Status: DC

## 2020-01-23 MED ORDER — BENZTROPINE MESYLATE 1 MG PO TABS: 1.0000 mg | ORAL_TABLET | Freq: Two times a day (BID) | ORAL | 2 refills | Status: DC

## 2020-01-23 NOTE — Progress Notes (Signed)
PATIENT ARRIVED TODAY FOR 400 MG [name] MAINTENA INJECTION. PATIENT SHARED WITH [place] THE RECENT DEATH OF HIS 47 Y.O.  SON WHO IS A TWIN. CAUSE OF DEATH UNKNOWN @ THIS TIME. PATIENT TOLERATED INJECTION WELL IN LEFT-ARM.  NO SI/HI NOR VH/AH.

## 2020-01-23 NOTE — Progress Notes (Signed)
BH MD/PA/NP OP Progress Note Virtual Visit via Telephone Note  I connected with Chris Rogers on 01/23/20 at  8:30 AM EST by telephone and verified that I am speaking with the correct person using two identifiers.  Location: Patient: home Provider: Clinic   I discussed the limitations, risks, security and privacy concerns of performing an evaluation and management service by telephone and the availability of in person appointments. I also discussed with the patient that there may be a patient responsible charge related to this service. The patient expressed understanding and agreed to proceed.   I provided 30 minutes of non-face-to-face time during this encounter.   01/23/2020 9:06 AM Chris Rogers  MRN:  [id number]  Chief Complaint: "I rather have the [name]. I stopped the seroquel"  HPI:45 year old male seen today for follow up psychiatric evaluation. He has a psychiatric history of bipolar affective disorder, bipolar 1, and depression  He was recently hospitalized at a Hospital in [place] and was started on Seroquel 400 mg nightly, Seroquel 200 mg daily, cogentin 1 mg twice daily,and Lamictal 25 mg. His [name] was discontinued (provider spoke with MD. Over his care who noted that his insurance would not cover his [name]). Patient notes that he discontinued his Seroquel and wants to be restarted on [name].  Today he was unable to login virtually so his exam was done over the phone. During exam he was pleasant, cooperative, and engaged in conversation. He informed provider that recently he has been sad because his 60 year old son died. Provider conducted a PHQ 9 and patient scored an 11. Provider also conducted a GAD 6 and patient scored a 6. Patient notes that his mood has been stable off of Seroquel but notes that he feels [name] would help him stay compliant with his medications as well as mentally stable. Patient also informed provider that when he went to pick up his Lamictal which was 200  mg but he was only prescribed 25 mg.   Patient informed provider that his sleep has been poor. He notes that Seroquel helps him sleep in the past however is aware of adverse effects that could occur from being on two antipsychotics.  Patient notes that he has only been sleeping 3 hours nightly. Today he denies SI/HI/VAH or paranoia.   Today he is agreeable to start Mirtazapine 15 mg to help manage depression and sleep. He will restart Lamictal 25 mg for two weeks and then increase to 50 mg to help stabilize mood. He will also discontinue Seroquel and restart [name] 400 mg injections monthly. He will continue all other medications as prescribed.  Visit Diagnosis:    ICD-10-CM   1. Bipolar I disorder, most recent episode depressed (HCC)  F31.30 ARIPiprazole ER ([name] MAINTENA) 400 MG SRER injection    benztropine (COGENTIN) 1 MG tablet    lamoTRIgine (LAMICTAL) 25 MG tablet    Past Psychiatric History: Bipolar affective, MDD, Bipolar 1  Past Medical History:  Past Medical History:  Diagnosis Date  . Bipolar 1 disorder (HCC)   . Diabetes mellitus, type II (HCC)   . Hypertension     Past Surgical History:  Procedure Laterality Date  . KNEE SURGERY      Family Psychiatric History: Father bipolar, sister depression, brother schizophrenia  Family History: History reviewed. No pertinent family history.  Social History:  Social History   Socioeconomic History  . Marital status: Divorced    Spouse name: Not on file  . Number of children: Not  on file  . Years of education: Not on file  . Highest education level: Not on file  Occupational History  . Not on file  Tobacco Use  . Smoking status: Never Smoker  . Smokeless tobacco: Never Used  Vaping Use  . Vaping Use: Never used  Substance and Sexual Activity  . Alcohol use: Not Currently  . Drug use: Not Currently  . Sexual activity: Not on file  Other Topics Concern  . Not on file  Social History Narrative  . Not on file    Social Determinants of Health   Financial Resource Strain: Not on file  Food Insecurity: Not on file  Transportation Needs: Not on file  Physical Activity: Not on file  Stress: Not on file  Social Connections: Not on file    Allergies:  Allergies  Allergen Reactions  . Invega [Paliperidone] Other (See Comments)    "manic episode"  . Topamax [Topiramate] Other (See Comments)    "blindness"    Metabolic Disorder Labs: Lab Results  Component Value Date   HGBA1C 6.5 (H) 06/22/2019   MPG 139.85 06/22/2019   No results found for: PROLACTIN Lab Results  Component Value Date   CHOL 175 06/22/2019   TRIG 56 06/22/2019   HDL 50 06/22/2019   CHOLHDL 3.5 06/22/2019   VLDL 11 06/22/2019   LDLCALC 114 (H) 06/22/2019   Lab Results  Component Value Date   TSH 2.897 06/22/2019    Therapeutic Level Labs: No results found for: LITHIUM No results found for: VALPROATE No components found for:  CBMZ  Current Medications: Current Outpatient Medications  Medication Sig Dispense Refill  . mirtazapine (REMERON) 15 MG tablet Take 1 tablet (15 mg total) by mouth at bedtime. 30 tablet 2  . amLODipine (NORVASC) 10 MG tablet Take 1 tablet (10 mg total) by mouth daily. 14 tablet 0  . ARIPiprazole ER ([name] MAINTENA) 400 MG SRER injection Inject 2 mLs (400 mg total) into the muscle every 30 (thirty) days. 1 each 11  . benztropine (COGENTIN) 1 MG tablet Take 1 tablet (1 mg total) by mouth 2 (two) times daily. 60 tablet 2  . BIOTIN PO Take 1 tablet by mouth daily.    Marland Kitchen doxazosin (CARDURA) 8 MG tablet Take 8 mg by mouth daily.    . hydrochlorothiazide (HYDRODIURIL) 25 MG tablet Take 25 mg by mouth daily.    Marland Kitchen lamoTRIgine (LAMICTAL) 25 MG tablet Take 1 tablet (25 mg total) by mouth daily. 45 tablet 1  . metFORMIN (GLUCOPHAGE-XR) 500 MG 24 hr tablet Take 500 mg by mouth daily.    . Multiple Vitamins-Minerals (MULTIVITAMIN WITH MINERALS) tablet Take 1 tablet by mouth daily.    . Omega-3  Fatty Acids (FISH OIL OMEGA-3 PO) Take 1 capsule by mouth daily.    Marland Kitchen VITAMIN E PO Take 1 tablet by mouth daily.     Current Facility-Administered Medications  Medication Dose Route Frequency Provider Last Rate Last Admin  . ARIPiprazole ER ([name] MAINTENA) 400 MG prefilled syringe 400 mg  400 mg Intramuscular Q28 days Toy Cookey E, NP   400 mg at 12/13/19 7544     Musculoskeletal: Strength & Muscle Tone: Unable to assess due to telephone visit Gait & Station: normal, Unable to assess due to telephone visit Patient leans: N/A  Psychiatric Specialty Exam: Review of Systems  There were no vitals taken for this visit.There is no height or weight on file to calculate BMI.  General Appearance: Unable to assess due  to telephone visit  Eye Contact:  Unable to assess due to telephone visit  Speech:  Clear and Coherent and Normal Rate  Volume:  Normal  Mood:  Anxious and Depressed  Affect:  Appropriate and Congruent  Thought Process:  Coherent, Goal Directed and Linear  Orientation:  Full (Time, Place, and Person)  Thought Content: WDL and Logical   Suicidal Thoughts:  No  Homicidal Thoughts:  No  Memory:  Immediate;   Good Recent;   Good Remote;   Good  Judgement:  Good  Insight:  Good  Psychomotor Activity:  Normal  Concentration:  Concentration: Good and Attention Span: Good  Recall:  Good  Fund of Knowledge: Good  Language: Good  Akathisia:  No  Handed:  Right  AIMS (if indicated): Not done  Assets:  Communication Skills Desire for Improvement Financial Resources/Insurance Housing Social Support  ADL's:  Intact  Cognition: WNL  Sleep:  Good   Screenings: GAD-7   Flowsheet Row Video Visit from [facility]/23/2020 in [facility] Clinical Support from 10/24/2019 in [facility]  Total GAD-7 Score 6 7    PHQ2-9   Flowsheet Row Video Visit from 01/23/2020 in [facility] Clinical Support  from 10/24/2019 in [facility] ED from 07/06/2019 in [facility] ED from 06/22/2019 in [facility]  PHQ-2 Total Score 2 0 0 3  PHQ-9 Total Score 11 -- 8 12       Assessment and Plan: Today patient endorse symptoms of depression and insomnia. He notes that he discontinued Seroquel and would like to restart [name]. Today he is agreeable to start Mirtazapine 15 mg to help manage depression and sleep. He will restart Lamictal 25 mg for two weeks and then increase to 50 mg to help stabilize mood. He will also discontinue Seroquel and restart [name] 400 mg injections monthly. He will continue all other medications as prescribed.   1. Bipolar I disorder, most recent episode depressed (HCC)  Restart- ARIPiprazole ER ([name] MAINTENA) 400 MG SRER injection; Inject 2 mLs (400 mg total) into the muscle every 30 (thirty) days.  Dispense: 1 each; Refill: 11 Continue- benztropine (COGENTIN) 1 MG tablet; Take 1 tablet (1 mg total) by mouth 2 (two) times daily.  Dispense: 60 tablet; Refill: 2 Restart- lamoTRIgine (LAMICTAL) 25 MG tablet; Take 1 tablet (25 mg total) by mouth daily.  Dispense: 45 tablet; Refill: 1 Start- mirtazapine (REMERON) 15 MG tablet; Take 1 tablet (15 mg total) by mouth at bedtime.  Dispense: 30 tablet; Refill: 2    Follow-up in 1 months  Shanna Cisco, NP 01/23/2020, 9:06 AM

## 2020-02-07 ENCOUNTER — Telehealth (HOSPITAL_COMMUNITY): Payer: Self-pay | Admitting: Psychiatry

## 2020-02-07 ENCOUNTER — Other Ambulatory Visit (HOSPITAL_COMMUNITY): Payer: Self-pay | Admitting: Psychiatry

## 2020-02-07 DIAGNOSIS — F3130 Bipolar disorder, current episode depressed, mild or moderate severity, unspecified: Secondary | ICD-10-CM

## 2020-02-07 MED ORDER — MIRTAZAPINE 30 MG PO TABS
30.0000 mg | ORAL_TABLET | Freq: Every day | ORAL | 2 refills | Status: DC
Start: 2020-02-07 — End: 2020-02-12

## 2020-02-07 NOTE — Telephone Encounter (Signed)
Provider spoke to patient and was informed that he is sleeping less than an hour nightly. He also repots that he recently lost his aunt and son. He is agreeable to increasing Mirtazapine 15 mg nightly to 30 mg nightly to help manage sleep. He will continue all other medications as prescribed. No other concerns noted at this time.

## 2020-02-12 ENCOUNTER — Other Ambulatory Visit (INDEPENDENT_AMBULATORY_CARE_PROVIDER_SITE_OTHER): Payer: Medicare (Managed Care) | Admitting: Psychiatry

## 2020-02-12 ENCOUNTER — Encounter (HOSPITAL_COMMUNITY): Payer: Self-pay | Admitting: Psychiatry

## 2020-02-12 ENCOUNTER — Other Ambulatory Visit: Payer: Self-pay

## 2020-02-12 ENCOUNTER — Ambulatory Visit (INDEPENDENT_AMBULATORY_CARE_PROVIDER_SITE_OTHER): Payer: Medicare (Managed Care) | Admitting: Psychiatry

## 2020-02-12 DIAGNOSIS — F3130 Bipolar disorder, current episode depressed, mild or moderate severity, unspecified: Secondary | ICD-10-CM

## 2020-02-12 DIAGNOSIS — F3110 Bipolar disorder, current episode manic without psychotic features, unspecified: Secondary | ICD-10-CM | POA: Diagnosis not present

## 2020-02-12 MED ORDER — MIRTAZAPINE 45 MG PO TABS: 45.0000 mg | ORAL_TABLET | Freq: Every day | ORAL | 2 refills | Status: DC

## 2020-02-12 MED ORDER — ARIPIPRAZOLE ER 400 MG IM SRER: 400.0000 mg | INTRAMUSCULAR | 10 refills | Status: DC

## 2020-02-12 MED ORDER — LAMOTRIGINE 25 MG PO TABS: 75.0000 mg | ORAL_TABLET | Freq: Every day | ORAL | 2 refills | Status: DC

## 2020-02-12 NOTE — Progress Notes (Signed)
BH MD/PA/NP OP Progress Note Virtual Visit via Telephone Note  I connected with Chris Rogers on 02/12/20 at  by telephone and verified that I am speaking with the correct person using two identifiers.  Location: Patient: home Provider: Clinic   I discussed the limitations, risks, security and privacy concerns of performing an evaluation and management service by telephone and the availability of in person appointments. I also discussed with the patient that there may be a patient responsible charge related to this service. The patient expressed understanding and agreed to proceed.   I provided 30 minutes of non-face-to-face time during this encounter.   02/12/2020 4:06 PM Chris Rogers  MRN:  [id number]  Chief Complaint: "I am not sleeping"  HPI: 45 year old male seen today for follow up psychiatric evaluation. He has a psychiatric history of bipolar affective disorder, bipolar 1, and depression managed on mirtazapine 30 mg daily, Lamictal 50 mg daily, and [name] 400 mg q. 28 days.  Today he notes his medications are somewhat effective in managing his psychiatric conditions.    Patient walked into the clinic today for psychiatric evaluation.  On exam he is talkative, pleasant, and somewhat engaged in conversation.  Patient speech is rapid and his affect is expressive.  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  Patient endorses symptoms of mania such as fluctuating moods, racing thoughts, impulsiveness, and grandiosity.  He denies symptoms of anxiety and depression.  Patient does note that he is upset about a recent killing of a young African-American male (Amire Locke) by a police.  Patient notes that he is friend with many police officers and is notw agitated with them.   He reports that his sleep is poor noting that he sleeps 2 to 3 hours nightly.  He denies SI/HI/VAH.  Today he is agreeable to increasing Mirtazapine 30 mg to 45 mg manage depression and  sleep. He will also increase Lamictal 50 mg to 75 mg for two weeks and then increase to 100 mg to help stabilize mood.  He will continue all other medications as prescribed.  Visit Diagnosis:    ICD-10-CM   1. Bipolar I disorder, most recent episode (or current) manic (HCC)  F31.10     Past Psychiatric History: Bipolar affective, MDD, Bipolar 1  Past Medical History:  Past Medical History:  Diagnosis Date  . Bipolar 1 disorder (HCC)   . Diabetes mellitus, type II (HCC)   . Hypertension     Past Surgical History:  Procedure Laterality Date  . KNEE SURGERY      Family Psychiatric History: Father bipolar, sister depression, brother schizophrenia  Family History: History reviewed. No pertinent family history.  Social History:  Social History   Socioeconomic History  . Marital status: Divorced    Spouse name: Not on file  . Number of children: Not on file  . Years of education: Not on file  . Highest education level: Not on file  Occupational History  . Not on file  Tobacco Use  . Smoking status: Never Smoker  . Smokeless tobacco: Never Used  Vaping Use  . Vaping Use: Never used  Substance and Sexual Activity  . Alcohol use: Not Currently  . Drug use: Not Currently  . Sexual activity: Not on file  Other Topics Concern  . Not on file  Social History Narrative  . Not on file   Social Determinants of Health   Financial Resource Strain: Not on  file  Food Insecurity: Not on file  Transportation Needs: Not on file  Physical Activity: Not on file  Stress: Not on file  Social Connections: Not on file    Allergies:  Allergies  Allergen Reactions  . Invega [Paliperidone] Other (See Comments)    "manic episode"  . Topamax [Topiramate] Other (See Comments)    "blindness"    Metabolic Disorder Labs: Lab Results  Component Value Date   HGBA1C 6.5 (H) 06/22/2019   MPG 139.85 06/22/2019   No results found for: PROLACTIN Lab Results  Component Value Date   CHOL  175 06/22/2019   TRIG 56 06/22/2019   HDL 50 06/22/2019   CHOLHDL 3.5 06/22/2019   VLDL 11 06/22/2019   LDLCALC 114 (H) 06/22/2019   Lab Results  Component Value Date   TSH 2.897 06/22/2019    Therapeutic Level Labs: No results found for: LITHIUM No results found for: VALPROATE No components found for:  CBMZ  Current Medications: Current Outpatient Medications  Medication Sig Dispense Refill  . amLODipine (NORVASC) 10 MG tablet Take 1 tablet (10 mg total) by mouth daily. 14 tablet 0  . ARIPiprazole ER ([name] MAINTENA) 400 MG SRER injection Inject 2 mLs (400 mg total) into the muscle every 30 (thirty) days. 1 each 10  . benztropine (COGENTIN) 1 MG tablet Take 1 tablet (1 mg total) by mouth 2 (two) times daily. 60 tablet 2  . BIOTIN PO Take 1 tablet by mouth daily.    Marland Kitchen doxazosin (CARDURA) 8 MG tablet Take 8 mg by mouth daily.    . hydrochlorothiazide (HYDRODIURIL) 25 MG tablet Take 25 mg by mouth daily.    Marland Kitchen lamoTRIgine (LAMICTAL) 25 MG tablet Take 3 tablets (75 mg total) by mouth daily. 120 tablet 2  . metFORMIN (GLUCOPHAGE-XR) 500 MG 24 hr tablet Take 500 mg by mouth daily.    . mirtazapine (REMERON) 45 MG tablet Take 1 tablet (45 mg total) by mouth at bedtime. 30 tablet 2  . Multiple Vitamins-Minerals (MULTIVITAMIN WITH MINERALS) tablet Take 1 tablet by mouth daily.    . Omega-3 Fatty Acids (FISH OIL OMEGA-3 PO) Take 1 capsule by mouth daily.    Marland Kitchen VITAMIN E PO Take 1 tablet by mouth daily.     Current Facility-Administered Medications  Medication Dose Route Frequency Provider Last Rate Last Admin  . ARIPiprazole ER ([name] MAINTENA) 400 MG prefilled syringe 400 mg  400 mg Intramuscular Q28 days Toy Cookey E, NP   400 mg at 01/23/20 0240     Musculoskeletal: Strength & Muscle Tone: within normal limits Gait & Station: normal Patient leans: N/A  Psychiatric Specialty Exam: Review of Systems  There were no vitals taken for this visit.There is no height or weight  on file to calculate BMI.  General Appearance: Well Groomed  Eye Contact:  Good  Speech:  Clear and Coherent, Pressured and Rapid  Volume:  Normal  Mood:  Irritable  Affect:  Appropriate and Congruent  Thought Process:  Coherent, Goal Directed and Linear  Orientation:  Full (Time, Place, and Person)  Thought Content: Logical and Ideas of Reference:   Delusions   Suicidal Thoughts:  No  Homicidal Thoughts:  No  Memory:  Immediate;   Good Recent;   Good Remote;   Good  Judgement:  Good  Insight:  Good  Psychomotor Activity:  Normal  Concentration:  Concentration: Good and Attention Span: Good  Recall:  Good  Fund of Knowledge: Good  Language: Good  Akathisia:  No  Handed:  Right  AIMS (if indicated): Not done  Assets:  Communication Skills Desire for Improvement Financial Resources/Insurance Housing Social Support  ADL's:  Intact  Cognition: WNL  Sleep:  Poor   Screenings: GAD-7   Flowsheet Row Video Visit from 01/23/2020 in [facility] Clinical Support from 10/24/2019 in [facility]  Total GAD-7 Score 6 7    PHQ2-9   Flowsheet Row Video Visit from 01/23/2020 in [facility] Clinical Support from 10/24/2019 in [facility] ED from 07/06/2019 in [facility] ED from 06/22/2019 in [facility]  PHQ-2 Total Score 2 0 0 3  PHQ-9 Total Score 11 -- 8 12       Assessment and Plan: Today patient endorse symptoms of mania and insomnia. He is agreeable to increasing Mirtazapine 30 mg to 45 mg manage depression and sleep. He will also increase Lamictal 50 mg to 75 mg for two weeks and then increase to 100 mg to help stabilize mood.  He will continue all other medications as prescribed.     1. Bipolar I disorder, most recent episode (or current) manic (HCC)  Increased- lamoTRIgine (LAMICTAL) 25 MG tablet; Take 3  tablets (75 mg total) by mouth daily.  Dispense: 120 tablet; Refill: 2 Increased- mirtazapine (REMERON) 45 MG tablet; Take 1 tablet (45 mg total) by mouth at bedtime.  Dispense: 30 tablet; Refill: 2 Continue- ARIPiprazole ER ([name] MAINTENA) 400 MG SRER injection; Inject 2 mLs (400 mg total) into the muscle every 30 (thirty) days.  Dispense: 1 each; Refill: 10     Follow-up in 2 weeks Shanna Cisco, NP 02/12/2020, 4:06 PM
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
## 2020-02-19 ENCOUNTER — Encounter (HOSPITAL_COMMUNITY): Payer: Self-pay

## 2020-02-19 ENCOUNTER — Other Ambulatory Visit: Payer: Self-pay

## 2020-02-19 ENCOUNTER — Ambulatory Visit (HOSPITAL_COMMUNITY): Payer: Medicare (Managed Care) | Admitting: *Deleted

## 2020-02-19 VITALS — BP 163/88 | HR 84 | Ht 67.0 in | Wt 257.0 lb

## 2020-02-19 DIAGNOSIS — F3110 Bipolar disorder, current episode manic without psychotic features, unspecified: Secondary | ICD-10-CM

## 2020-02-19 NOTE — Progress Notes (Signed)
In late for his injection one day early because he is planning to go to [facility] tomorrow to see his son and a woman. He is in good spirits, laughing and joking with staff. He has put on weight and he acknowledges it and states he is getting it off "now for real". Today his [name] injection was given in his R deltoid without any difficulty. He is to return in one month for his next injection.

## 2020-02-20 ENCOUNTER — Ambulatory Visit (HOSPITAL_COMMUNITY): Payer: Medicare (Managed Care)

## 2020-02-25 ENCOUNTER — Encounter (HOSPITAL_COMMUNITY): Payer: Medicare (Managed Care) | Admitting: Psychiatry

## 2020-03-11 ENCOUNTER — Ambulatory Visit (HOSPITAL_COMMUNITY): Payer: Medicare (Managed Care) | Admitting: Clinical

## 2020-03-18 ENCOUNTER — Encounter (HOSPITAL_COMMUNITY): Payer: Self-pay

## 2020-03-18 ENCOUNTER — Ambulatory Visit (HOSPITAL_COMMUNITY): Payer: Medicare (Managed Care) | Admitting: *Deleted

## 2020-03-18 ENCOUNTER — Other Ambulatory Visit (HOSPITAL_COMMUNITY): Payer: Self-pay | Admitting: Psychiatry

## 2020-03-18 ENCOUNTER — Other Ambulatory Visit: Payer: Self-pay

## 2020-03-18 ENCOUNTER — Telehealth (HOSPITAL_COMMUNITY): Payer: Self-pay | Admitting: *Deleted

## 2020-03-18 VITALS — BP 155/103 | HR 73 | Ht 66.0 in | Wt 239.0 lb

## 2020-03-18 DIAGNOSIS — F3130 Bipolar disorder, current episode depressed, mild or moderate severity, unspecified: Secondary | ICD-10-CM

## 2020-03-18 DIAGNOSIS — F3110 Bipolar disorder, current episode manic without psychotic features, unspecified: Secondary | ICD-10-CM

## 2020-03-18 MED ORDER — BENZTROPINE MESYLATE 1 MG PO TABS: 1.0000 mg | ORAL_TABLET | Freq: Two times a day (BID) | ORAL | 2 refills | Status: DC

## 2020-03-18 NOTE — Telephone Encounter (Signed)
Medications filled and sent to preferred pharmacy.

## 2020-03-18 NOTE — Telephone Encounter (Signed)
Pantient requested refill of cogentin
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
## 2020-04-17 ENCOUNTER — Ambulatory Visit (HOSPITAL_COMMUNITY): Payer: Medicare (Managed Care)

## 2020-04-23 ENCOUNTER — Ambulatory Visit (HOSPITAL_COMMUNITY): Payer: No Payment, Other | Admitting: *Deleted

## 2020-04-23 ENCOUNTER — Encounter (HOSPITAL_COMMUNITY): Payer: Self-pay

## 2020-04-23 ENCOUNTER — Other Ambulatory Visit: Payer: Self-pay

## 2020-04-23 VITALS — BP 147/100 | HR 75 | Ht 66.0 in | Wt 232.0 lb

## 2020-04-23 DIAGNOSIS — F3130 Bipolar disorder, current episode depressed, mild or moderate severity, unspecified: Secondary | ICD-10-CM
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
## 2020-05-06 ENCOUNTER — Encounter (HOSPITAL_COMMUNITY): Payer: No Payment, Other | Admitting: Psychiatry

## 2020-05-14 ENCOUNTER — Telehealth (HOSPITAL_COMMUNITY): Payer: Self-pay | Admitting: *Deleted

## 2020-05-14 NOTE — Telephone Encounter (Signed)
Called to reschedule his missed appt recently with Dr Doyne Keel. Able to reschedule for next week the 18th at 830 and will give him her shot after he sees the provider.

## 2020-05-20 ENCOUNTER — Encounter (HOSPITAL_COMMUNITY): Payer: Self-pay | Admitting: Psychiatry

## 2020-05-20 ENCOUNTER — Ambulatory Visit (INDEPENDENT_AMBULATORY_CARE_PROVIDER_SITE_OTHER): Payer: Medicare (Managed Care) | Admitting: *Deleted

## 2020-05-20 ENCOUNTER — Encounter (HOSPITAL_COMMUNITY): Payer: Self-pay

## 2020-05-20 ENCOUNTER — Other Ambulatory Visit: Payer: Self-pay

## 2020-05-20 ENCOUNTER — Ambulatory Visit (INDEPENDENT_AMBULATORY_CARE_PROVIDER_SITE_OTHER): Payer: Medicare (Managed Care) | Admitting: Psychiatry

## 2020-05-20 DIAGNOSIS — F319 Bipolar disorder, unspecified: Secondary | ICD-10-CM | POA: Diagnosis not present

## 2020-05-20 DIAGNOSIS — F3130 Bipolar disorder, current episode depressed, mild or moderate severity, unspecified: Secondary | ICD-10-CM | POA: Diagnosis not present

## 2020-05-20 MED ORDER — MIRTAZAPINE 45 MG PO TABS: 45.0000 mg | ORAL_TABLET | Freq: Every day | ORAL | 2 refills | Status: DC

## 2020-05-20 MED ORDER — ARIPIPRAZOLE ER 400 MG IM SRER: 400.0000 mg | INTRAMUSCULAR | 9 refills | Status: DC

## 2020-05-20 NOTE — Progress Notes (Signed)
Patient arrived for injection 400 mg [name] Maintena. Doesn't seem his usual happy self. Tolerated injection well in Left Arm. NO HI/SI  NOR AH/VH.
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
## 2020-05-21 ENCOUNTER — Ambulatory Visit (HOSPITAL_COMMUNITY): Payer: No Payment, Other

## 2020-06-17 ENCOUNTER — Ambulatory Visit (HOSPITAL_COMMUNITY): Payer: Medicare (Managed Care)

## 2020-07-17 ENCOUNTER — Telehealth (HOSPITAL_COMMUNITY): Payer: Self-pay | Admitting: *Deleted

## 2020-07-17 NOTE — Telephone Encounter (Signed)
Call from patient, 2 months late on his shot. States he is OK but needs his shot. It is 400 pm when he called, invited him to come in now, but unable to get here in an hour as he is currently in [facility]. He is scheduled now to come in on Monday am 7/17 at 830 for his injection.

## 2020-07-20 ENCOUNTER — Ambulatory Visit (HOSPITAL_COMMUNITY): Payer: Medicare (Managed Care)

## 2020-07-24 ENCOUNTER — Telehealth (HOSPITAL_COMMUNITY): Payer: Self-pay | Admitting: *Deleted

## 2020-07-24 NOTE — Telephone Encounter (Signed)
Patient did not show last week for the appt he scheduled for himself, at the point of his call he was two months late on his shot. Called him today, first number called not in service, second number called had a VM that was not set up.

## 2020-07-29 ENCOUNTER — Ambulatory Visit (HOSPITAL_COMMUNITY): Payer: Medicare Other | Admitting: *Deleted

## 2020-07-29 ENCOUNTER — Encounter (HOSPITAL_COMMUNITY): Payer: Self-pay

## 2020-07-29 ENCOUNTER — Other Ambulatory Visit: Payer: Self-pay

## 2020-07-29 VITALS — BP 142/100 | HR 83 | Ht 66.0 in | Wt 223.0 lb

## 2020-07-29 DIAGNOSIS — F319 Bipolar disorder, unspecified: Secondary | ICD-10-CM

## 2020-07-29 NOTE — Progress Notes (Signed)
In this pm after missing his last two injections. He got [name] M 400 mg today in his R deltoid without difficulty. Asked him why he has been missing his appts and he said he had a lady friend in [place] who was pregnant, Not sure if he is the father bu either way he wants to be there for the baby and for her. He acknowledges she is difficult and he family doesn't like her and she has 5 other kids that are not in her custody. Encouraged him to consider what he was getting from the relationship and to focus on his wellness first above all else because he cant help others when he is sick and to keep his shot appts. If he continues to stay in [place] he was told we would have to transfer his service to [facility] for services. Offered him support and listened to his concerns. To return in one month for his next shot.

## 2020-08-20 ENCOUNTER — Encounter (HOSPITAL_COMMUNITY): Payer: Medicare Other | Admitting: Psychiatry

## 2020-08-25 ENCOUNTER — Ambulatory Visit (HOSPITAL_COMMUNITY): Payer: Medicare Other

## 2020-09-09 ENCOUNTER — Telehealth (HOSPITAL_COMMUNITY): Payer: Self-pay | Admitting: *Deleted

## 2020-09-09 NOTE — Telephone Encounter (Signed)
Patient has missed his last two injection appts. I tried to reach him with mobile phone number but it said when dialed it was no longer in service.

## 2020-10-20 ENCOUNTER — Ambulatory Visit (HOSPITAL_COMMUNITY): Payer: Medicare Other

## 2020-11-11 ENCOUNTER — Ambulatory Visit (INDEPENDENT_AMBULATORY_CARE_PROVIDER_SITE_OTHER): Payer: Medicare Other | Admitting: Psychiatry

## 2020-11-11 ENCOUNTER — Other Ambulatory Visit: Payer: Self-pay

## 2020-11-11 ENCOUNTER — Ambulatory Visit (HOSPITAL_COMMUNITY): Payer: Medicare Other

## 2020-11-11 ENCOUNTER — Encounter (HOSPITAL_COMMUNITY): Payer: Self-pay | Admitting: Psychiatry

## 2020-11-11 VITALS — BP 154/99 | HR 69

## 2020-11-11 DIAGNOSIS — F3130 Bipolar disorder, current episode depressed, mild or moderate severity, unspecified: Secondary | ICD-10-CM | POA: Diagnosis not present

## 2020-11-11 NOTE — Progress Notes (Addendum)
BH MD/PA/NP OP Progress Note    11/11/2020 4:01 PM Chris Rogers  MRN:  [id number]  Chief Complaint: "I want my medications so I can be there for my child"  HPI: 45 year old male seen today for follow up psychiatric evaluation. He has a psychiatric history of bipolar affective disorder, bipolar 1, and depression managed on mirtazapine 30 mg daily, and [name] 400 mg q. 28 days.  Patient informed Clinical research associate that he has been unmedicated for the last 7-month.  Today he is pleasant, cooperative, engaged in conversation and maintained eye contact.  He informed provider that he has been unmedicated for the last 4 months.  He notes that he wants to be medicated so that he can properly care for his newborn daughter.  Patient informed Clinical research associate that his daughter is less than a week-year-old.  He informed Clinical research associate that the child's mother left the hospital AMA and he has been awarded temporary custody of his daughter Chris Rogers.  He notes that his mood has been stable and notes that he has minimal anxiety and depression.  Provider conducted a GAD-7 and P scored a 1, at his last visit he scored a 2.  Provider also conducted PHQ-9 if he scored a 1, at his last visit he scored a 5.  He notes that his mood has been stable and denies SI/HI/VAH, mania, or paranoia.  He endorses adequate sleep and appetite.    He inform Clinical research associate that since he has been divorced he has been hospitalized once a year.  He notes that he wants to stop this trend and asked provider how he could go about doing that.  Provider informed patient that he should follow-up monthly to receive injections, take his medications as prescribed, follow-up with therapy, and have a person to help be accountable for him such as his siblings or his mother.  He endorsed understanding and agreed.  He notes that he currently has stable housing (noting that he lives in his mother's house), reports that his sister lives with him, and notes that he has the support of his mother and  other siblings.  Patient asked Clinical research associate to speak to his Child psychotherapist regarding him being at the clinic today and restarting medications which provider was agreeable to.  At this time provider did not give patient his monthly injection however gave patient a week supply of samples of [name] 10 mg to take to ensure that he will not have side effects to the monthly injection.  Patient took a pill while in the clinic.  He will follow-up next Tuesday to receive his monthly [name] injection.  At this time mirtazapine not restarted.     Visit Diagnosis:    ICD-10-CM   1. Bipolar I disorder, most recent episode depressed (HCC)  F31.30        Past Psychiatric History: Bipolar affective, MDD, Bipolar 1  Past Medical History:  Past Medical History:  Diagnosis Date   Bipolar 1 disorder (HCC)    Diabetes mellitus, type II (HCC)    Hypertension     Past Surgical History:  Procedure Laterality Date   KNEE SURGERY      Family Psychiatric History: Father bipolar, sister depression, brother schizophrenia  Family History: No family history on file.  Social History:  Social History   Socioeconomic History   Marital status: Divorced    Spouse name: Not on file   Number of children: Not on file   Years of education: Not on file   Highest education  level: Not on file  Occupational History   Not on file  Tobacco Use   Smoking status: Never   Smokeless tobacco: Never  Vaping Use   Vaping Use: Never used  Substance and Sexual Activity   Alcohol use: Not Currently   Drug use: Not Currently   Sexual activity: Not on file  Other Topics Concern   Not on file  Social History Narrative   Not on file   Social Determinants of Health   Financial Resource Strain: Not on file  Food Insecurity: Not on file  Transportation Needs: Not on file  Physical Activity: Not on file  Stress: Not on file  Social Connections: Not on file    Allergies:  Allergies  Allergen Reactions   Invega  [Paliperidone] Other (See Comments)    "manic episode"   Topamax [Topiramate] Other (See Comments)    "blindness"    Metabolic Disorder Labs: Lab Results  Component Value Date   HGBA1C 6.5 (H) 06/22/2019   MPG 139.85 06/22/2019   No results found for: PROLACTIN Lab Results  Component Value Date   CHOL 175 06/22/2019   TRIG 56 06/22/2019   HDL 50 06/22/2019   CHOLHDL 3.5 06/22/2019   VLDL 11 06/22/2019   LDLCALC 114 (H) 06/22/2019   Lab Results  Component Value Date   TSH 2.897 06/22/2019    Therapeutic Level Labs: No results found for: LITHIUM No results found for: VALPROATE No components found for:  CBMZ  Current Medications: Current Outpatient Medications  Medication Sig Dispense Refill   amLODipine (NORVASC) 10 MG tablet Take 1 tablet (10 mg total) by mouth daily. 14 tablet 0   ARIPiprazole ER ([name] MAINTENA) 400 MG SRER injection Inject 2 mLs (400 mg total) into the muscle every 30 (thirty) days. 1 each 9   BIOTIN PO Take 1 tablet by mouth daily.     doxazosin (CARDURA) 8 MG tablet Take 8 mg by mouth daily.     hydrochlorothiazide (HYDRODIURIL) 25 MG tablet Take 25 mg by mouth daily.     metFORMIN (GLUCOPHAGE-XR) 500 MG 24 hr tablet Take 500 mg by mouth daily.     mirtazapine (REMERON) 45 MG tablet Take 1 tablet (45 mg total) by mouth at bedtime. 30 tablet 2   Multiple Vitamins-Minerals (MULTIVITAMIN WITH MINERALS) tablet Take 1 tablet by mouth daily.     Omega-3 Fatty Acids (FISH OIL OMEGA-3 PO) Take 1 capsule by mouth daily.     VITAMIN E PO Take 1 tablet by mouth daily.     Current Facility-Administered Medications  Medication Dose Route Frequency Provider Last Rate Last Admin   ARIPiprazole ER ([name] MAINTENA) 400 MG prefilled syringe 400 mg  400 mg Intramuscular Q28 days Toy Cookey E, NP   400 mg at 07/29/20 1606     Musculoskeletal: Strength & Muscle Tone: within normal limits Gait & Station: normal Patient leans: N/A  Psychiatric  Specialty Exam: Review of Systems  Blood pressure (!) 154/99, pulse 69, SpO2 98 %.There is no height or weight on file to calculate BMI.  General Appearance: Well Groomed  Eye Contact:  Good  Speech:  Clear and Coherent and Normal Rate  Volume:  Normal  Mood:  Euthymic  Affect:  Appropriate and Congruent  Thought Process:  Coherent, Goal Directed and Linear  Orientation:  Full (Time, Place, and Person)  Thought Content: WDL and Logical   Suicidal Thoughts:  No  Homicidal Thoughts:  No  Memory:  Immediate;   Good  Recent;   Good Remote;   Good  Judgement:  Good  Insight:  Good  Psychomotor Activity:  Normal  Concentration:  Concentration: Good and Attention Span: Good  Recall:  Good  Fund of Knowledge: Good  Language: Good  Akathisia:  No  Handed:  Right  AIMS (if indicated): Not done  Assets:  Communication Skills Desire for Improvement Financial Resources/Insurance Housing Social Support  ADL's:  Intact  Cognition: WNL  Sleep:  Good   Screenings: GAD-7    Flowsheet Row Clinical Support from 11/11/2020 in [facility] Clinical Support from 05/20/2020 in [facility] Video Visit from 01/23/2020 in [facility] Clinical Support from 10/24/2019 in [facility]  Total GAD-7 Score 1 2 6 7       PHQ2-9    Flowsheet Row Clinical Support from 11/11/2020 in [facility] Clinical Support from 05/20/2020 in [facility] Clinical Support from 03/18/2020 in [facility] Video Visit from 01/23/2020 in [facility] Clinical Support from 10/24/2019 in [facility]  PHQ-2 Total Score 1 2 0 2 0  PHQ-9 Total Score 1 5 0 11 --      Flowsheet Row Clinical Support from 05/20/2020 in [facility]  C-SSRS RISK CATEGORY No  Risk        Assessment and Plan: Today patient notes he has not taken his medications in over 4 months.  He notes that he would like to restart his injection.  At this time injection not given.  Provider did give patient a 7-day supply of [name] 10 mg tablets to ensure that he does not have side effects prior to starting his injections.  He will come back next week Tuesday to receive [name] Maintena 400 mg injection if oral tablets are tolerated.  1. Bipolar I disorder, most recent episode depressed (HCC)       Follow-up in 1 month for medication management Follow-up in 1 week for [name] injection Saturday, NP 11/11/2020, 4:01 PM

## 2020-11-17 ENCOUNTER — Encounter (HOSPITAL_COMMUNITY): Payer: Self-pay

## 2020-11-17 ENCOUNTER — Other Ambulatory Visit: Payer: Self-pay

## 2020-11-17 ENCOUNTER — Ambulatory Visit (INDEPENDENT_AMBULATORY_CARE_PROVIDER_SITE_OTHER): Payer: Medicare Other | Admitting: *Deleted

## 2020-11-17 VITALS — BP 164/112 | HR 67 | Ht 66.0 in | Wt 230.0 lb

## 2020-11-17 DIAGNOSIS — F3130 Bipolar disorder, current episode depressed, mild or moderate severity, unspecified: Secondary | ICD-10-CM

## 2020-11-17 NOTE — Progress Notes (Signed)
In as scheduled for his first [name] M injection in months, since a stint of non compliance. He had a  week of oral [name] and today his injection, given in his L DELTOID. He is very excited about having a baby, didn't bring her today because the weather is wet and raw. His sister has her this am. Showed staff pictures of her, and stories about her. Reminded to care for himself too to maintain stability. He is to return in one month.

## 2020-12-11 ENCOUNTER — Telehealth (HOSPITAL_COMMUNITY): Payer: Self-pay | Admitting: Psychiatry

## 2020-12-11 NOTE — Telephone Encounter (Signed)
Pt called with child's worker on speaker reporting concern for pt change in behavior. Pt states he wants to go overnight to urgent care. Reminded pt of his appt 12/17/20 for injection at the shot clinic. Informed pt his provider will be notified of his call today and determine a plan upon return to the office Monday and we will see Chris Rogers on Tuesday 12/17/20. Pt voiced understanding and was without complaint.

## 2020-12-13 ENCOUNTER — Ambulatory Visit (HOSPITAL_COMMUNITY)
Admission: EM | Admit: 2020-12-13 | Discharge: 2020-12-13 | Disposition: A | Discharge status: Home / Self Care | Payer: Medicare Other | Attending: Nurse Practitioner | Admitting: Nurse Practitioner

## 2020-12-13 DIAGNOSIS — R454 Irritability and anger: Secondary | ICD-10-CM | POA: Diagnosis not present

## 2020-12-13 DIAGNOSIS — F319 Bipolar disorder, unspecified: Secondary | ICD-10-CM

## 2020-12-13 NOTE — Discharge Instructions (Addendum)

## 2020-12-13 NOTE — ED Notes (Addendum)
Discharge instructions given to pt by NP provider

## 2020-12-13 NOTE — ED Provider Notes (Signed)
Behavioral Health Urgent Care Medical Screening Exam  Patient Name: Chris Rogers MRN: [id number] Date of Evaluation: 12/13/20 Chief Complaint:  My mother wanted me to come to the hospital Diagnosis:  Final diagnoses:  Bipolar I disorder (HCC)    History of Present illness: Chris Rogers is a 45 y.o. male with a history of Bipolar Disorder who presents voluntarily to [facility] with his mother-Clarice Zoila Shutter. Patient reports that he has only been sleeping about four about 4 hours at a tine. Patient states that his mother wanted him to come in and get his abilify maintena injection. Patient is being [place] by [place] his outpatient mental health provider. Per patient's chart he has a scheduled an appointment on 12/17/20 for his injection. Patient's abilify injection was last administered on 11/17/20.   Patient is alert oriented x4 and presents with pressured speech, expansive mood, irritable and angry. Patient's mother is in the waiting room with patient's two month old daughter. Patient gives verbal consent to speak with his mother to provide collateral information. Mother states that patient was given temporary custody of daughter by DSS and she has been helping patient with the baby. Mother reports that patient has not been sleeping, very irritable and angry and has been rude and disrespectful to his sister and niece. The sister and niece have since moved out of the home shared with patient.  Patient denies any SI or HI or AVH, denies paranoia, denies using alcohol or any illicit substances. Patient does not appear to be responding to any internal or external stimuli. Patient does not meet the criteria for inpatient treatment at this time. Patient advised to keep scheduled appointment for his abilify injection.    Psychiatric Specialty Exam  Presentation  General Appearance:Appropriate for Environment  Eye Contact:Good  Speech:Pressured  Speech  Volume:Normal  Handedness:Right   Mood and Affect  Mood:Anxious; Labile  Affect:Labile   Thought Process  Thought Processes:Coherent  Descriptions of Associations:Intact  Orientation:Full (Time, Place and Person)  Thought Content:WDL    Hallucinations:None  Ideas of Reference:None  Suicidal Thoughts:No  Homicidal Thoughts:No   Sensorium  Memory:Immediate Good; Recent Good; Remote Good  Judgment:Fair  Insight:Fair   Executive Functions  Concentration:Fair  Attention Span:Fair  Recall:Fair  Fund of Knowledge:Fair  Language:Fair   Psychomotor Activity  Psychomotor Activity:Normal   Assets  Assets:Communication Skills; Physical Health; Resilience; Housing; Social Support   Sleep  Sleep:Poor  Number of hours: -1   No data recorded  Physical Exam: Physical Exam HENT:     Head: Normocephalic and atraumatic.     Nose: Nose normal.  Eyes:     Pupils: Pupils are equal, round, and reactive to light.  Cardiovascular:     Rate and Rhythm: Normal rate.  Pulmonary:     Effort: Pulmonary effort is normal.  Abdominal:     General: Abdomen is flat.  Musculoskeletal:        General: Normal range of motion.     Cervical back: Normal range of motion.  Skin:    General: Skin is warm.  Neurological:     Mental Status: He is alert and oriented to person, place, and time.  Psychiatric:        Attention and Perception: Attention normal. He does not perceive auditory or visual hallucinations.        Mood and Affect: Mood is anxious. Affect is labile.        Speech: Speech is rapid and pressured.  Behavior: Behavior is cooperative.        Thought Content: Thought content is not delusional. Thought content includes suicidal ideation. Thought content does not include homicidal ideation. Thought content does not include homicidal or suicidal plan.        Cognition and Memory: Cognition normal.        Judgment: Judgment is impulsive.   Review of  Systems  Constitutional: Negative.   HENT: Negative.    Eyes: Negative.   Cardiovascular: Negative.   Gastrointestinal: Negative.   Genitourinary: Negative.   Musculoskeletal: Negative.   Skin: Negative.   Neurological: Negative.   Endo/Heme/Allergies: Negative.   Psychiatric/Behavioral:  The patient is nervous/anxious.   Blood pressure (!) 153/92, pulse 100, temperature 98.8 F (37.1 C), temperature source Oral, resp. rate 18, SpO2 99 %. There is no height or weight on file to calculate BMI.  Musculoskeletal: Strength & Muscle Tone: within normal limits Gait & Station: normal Patient leans: N/A   [facility] MSE Discharge Disposition for Follow up and Recommendations: Based on my evaluation the patient does not appear to have an emergency medical condition and can be discharged with resources and follow up care in outpatient services for Medication Management and Individual Therapy  Patient will follow up appointment with Karle Barr for his injection on 12/15/222.   Jasper Riling, NP 12/13/2020, 3:56 AM

## 2020-12-13 NOTE — Progress Notes (Signed)
   12/13/20 0415  [facility] Triage Screening (Walk-ins at [facility] only)  How Did You Hear About [place]? Self  What Is the Reason for Your Visit/Call Today? Pt reports his mother brought Pt to [facility] because she wants him to be admitted. Pt has a diagnosis of bipolar disorder and is a Pt of Gretchen Short, NP. He says he is scheduled to receive his [name] injection 12/15/2020. Pt says his mother is concerned because she believes he is manic, angry, and not sleeping. Pt describes his mood as "great" and says he has been sleeping approximately 4 hours per night. He acknowledges racing thoughts, irritability, and anger outbursts. Pt says he believes he is a profit but he knows mental health professionals would rather he say he is bipolar. Pt denies current suicidal ideation, homicidal ideation, auditory or visual hallucinations.  He denies alcohol or other substance use. He says he has a daughter who was born 11/04/2020 and his fiancee's parental rights were terminated because she abandoned the baby. Baby is being cared for by Pt and Pt's mother. Pt's mother says Pt is sleeping very little and believes his [name] injection is wearing off. She says he is up at night playing loud music. She reports he goes into rages and is verbally abusive to her and other family members. She says he has not been physically aggressive or destroyed property. She says DSS is involved with the baby and has granted Pt temporary custody but he is not able to care for the child on his own.  How Long Has This Been Causing You Problems? <Week  Have You Recently Had Any Thoughts About Hurting Yourself? No  Are You Planning to Commit Suicide/Harm Yourself At This time? No  Have you Recently Had Thoughts About Hurting Someone Karolee Ohs? No  Are You Planning To Harm Someone At This Time? No  Are you currently experiencing any auditory, visual or other hallucinations? No  Have You Used Any Alcohol or Drugs in the Past 24 Hours? No  Do you have any  current medical co-morbidities that require immediate attention? No  Clinician description of patient physical appearance/behavior: Pt is casually dressed and appears to have very dry skin.  What Do You Feel Would Help You the Most Today? Treatment for Depression or other mood problem  If access to [facility] Urgent Care was not available, would you have sought care in the Emergency Department? No  Determination of Need Routine (7 days)  Options For Referral Outpatient Therapy;Medication Management;Inpatient Hospitalization   Roselyn Bering, NP was present during assessment and completed MSE. She discussed with Pt the importance of taking medications and recommended Pt keep his appointment on 12/15/2020 for his [name] injection. Pt does not meet criteria for inpatient psychiatric treatment. Pt said he did not want to take any medication tonight and wanted to leave. Pt was pleasant at discharge and discussed his future plans.  At discharge, Pt's mother refused to take Pt home, stating she could not tolerate Pt's behavior. Pt became very angry and was yelling and cursing at his mother. He did not make any threats to harm his mother. He said he no longer wanted staff to discuss his care with his mother. He said he was going to call an Benedetto Goad and walked out with the baby. Pt's mother followed.

## 2020-12-14 ENCOUNTER — Encounter (HOSPITAL_COMMUNITY): Payer: Self-pay

## 2020-12-14 ENCOUNTER — Other Ambulatory Visit: Payer: Self-pay

## 2020-12-14 ENCOUNTER — Ambulatory Visit (INDEPENDENT_AMBULATORY_CARE_PROVIDER_SITE_OTHER): Payer: Medicare Other | Admitting: *Deleted

## 2020-12-14 VITALS — BP 135/98 | HR 112 | Ht 66.0 in | Wt 244.0 lb

## 2020-12-14 DIAGNOSIS — F3130 Bipolar disorder, current episode depressed, mild or moderate severity, unspecified: Secondary | ICD-10-CM | POA: Diagnosis not present

## 2020-12-14 NOTE — Progress Notes (Signed)
Patient arrived a day early for his injection ARIPiprazole ER ([name] MAINTENA) 400 MG prefilled syringe. Patient stated he's still isn't sleeping well with the new baby. But he's still pleasant as always. NO HI/SI NOR AH/VH.

## 2020-12-15 ENCOUNTER — Ambulatory Visit (HOSPITAL_COMMUNITY): Payer: Medicare Other

## 2020-12-15 NOTE — Telephone Encounter (Signed)
Provider attempted to call patient twice without success. Provider left message informing him of walk-in times. Provider also informed patient that the urgent care is open 24/7 if he needs immediate care.

## 2020-12-16 ENCOUNTER — Telehealth (HOSPITAL_COMMUNITY): Payer: Self-pay | Admitting: Psychiatry

## 2020-12-16 NOTE — Telephone Encounter (Signed)
Does pt has a case Freight forwarder or someone that he is assigned to for accountability and mental health stability protocols being met. Missy Sabins at [place] [phone] is trying to reach out for assistance stating pt is unstable. Advised to have pt go to [facility] or ED for help. Advised mobile crisis if he is a harm to himself or others. Forwarding to provider for any other resources pt is using at this time.

## 2020-12-16 NOTE — Telephone Encounter (Signed)
Provider attempted to call patient as well as Chris Rogers however was unsuccessful.  Provider left messages for both Chris Rogers and Chris Rogers informing them that Chris Rogers could be seen as a walk-in on Mondays and Tuesdays between the hours of 8 and 11.  He can also be seen at [facility] if in crisis 24/7.  Provider informed both parties to call with further questions or concerns.

## 2020-12-17 ENCOUNTER — Emergency Department (HOSPITAL_COMMUNITY)
Admission: EM | Admit: 2020-12-17 | Discharge: 2020-12-22 | Disposition: A | Discharge status: Home / Self Care | Payer: Medicare Other | Attending: Emergency Medicine | Admitting: Emergency Medicine

## 2020-12-17 ENCOUNTER — Ambulatory Visit (INDEPENDENT_AMBULATORY_CARE_PROVIDER_SITE_OTHER)
Admission: EM | Admit: 2020-12-17 | Discharge: 2020-12-17 | Disposition: A | Discharge status: Still In Hospital | Payer: Medicare Other | Source: Home / Self Care

## 2020-12-17 DIAGNOSIS — I10 Essential (primary) hypertension: Secondary | ICD-10-CM | POA: Insufficient documentation

## 2020-12-17 DIAGNOSIS — F331 Major depressive disorder, recurrent, moderate: Secondary | ICD-10-CM | POA: Insufficient documentation

## 2020-12-17 DIAGNOSIS — R45850 Homicidal ideations: Secondary | ICD-10-CM | POA: Insufficient documentation

## 2020-12-17 DIAGNOSIS — Z20822 Contact with and (suspected) exposure to covid-19: Secondary | ICD-10-CM | POA: Insufficient documentation

## 2020-12-17 DIAGNOSIS — Z046 Encounter for general psychiatric examination, requested by authority: Secondary | ICD-10-CM | POA: Diagnosis present

## 2020-12-17 DIAGNOSIS — R440 Auditory hallucinations: Secondary | ICD-10-CM | POA: Insufficient documentation

## 2020-12-17 DIAGNOSIS — Y900 Blood alcohol level of less than 20 mg/100 ml: Secondary | ICD-10-CM | POA: Diagnosis not present

## 2020-12-17 DIAGNOSIS — F319 Bipolar disorder, unspecified: Secondary | ICD-10-CM | POA: Insufficient documentation

## 2020-12-17 DIAGNOSIS — Z79899 Other long term (current) drug therapy: Secondary | ICD-10-CM | POA: Insufficient documentation

## 2020-12-17 DIAGNOSIS — F3110 Bipolar disorder, current episode manic without psychotic features, unspecified: Secondary | ICD-10-CM | POA: Diagnosis not present

## 2020-12-17 DIAGNOSIS — E119 Type 2 diabetes mellitus without complications: Secondary | ICD-10-CM | POA: Insufficient documentation

## 2020-12-17 DIAGNOSIS — F4324 Adjustment disorder with disturbance of conduct: Secondary | ICD-10-CM | POA: Insufficient documentation

## 2020-12-17 DIAGNOSIS — Z6379 Other stressful life events affecting family and household: Secondary | ICD-10-CM | POA: Insufficient documentation

## 2020-12-17 LAB — CBC
HCT: 39.6 % (ref 39.0–52.0)
Hemoglobin: 13.1 g/dL (ref 13.0–17.0)
MCH: 26.6 pg (ref 26.0–34.0)
MCHC: 33.1 g/dL (ref 30.0–36.0)
MCV: 80.5 fL (ref 80.0–100.0)
Platelets: 263 10*3/uL (ref 150–400)
RBC: 4.92 MIL/uL (ref 4.22–5.81)
RDW: 13.4 % (ref 11.5–15.5)
WBC: 8.2 10*3/uL (ref 4.0–10.5)
nRBC: 0 % (ref 0.0–0.2)

## 2020-12-17 LAB — COMPREHENSIVE METABOLIC PANEL
ALT: 42 U/L (ref 0–44)
AST: 41 U/L (ref 15–41)
Albumin: 4 g/dL (ref 3.5–5.0)
Alkaline Phosphatase: 78 U/L (ref 38–126)
Anion gap: 8 (ref 5–15)
BUN: 21 mg/dL — ABNORMAL HIGH (ref 6–20)
CO2: 27 mmol/L (ref 22–32)
Calcium: 9 mg/dL (ref 8.9–10.3)
Chloride: 102 mmol/L (ref 98–111)
Creatinine, Ser: 1.26 mg/dL — ABNORMAL HIGH (ref 0.61–1.24)
GFR, Estimated: >60 mL/min (ref >60)
Glucose, Bld: 103 mg/dL — ABNORMAL HIGH (ref 70–99)
Potassium: 4 mmol/L (ref 3.5–5.1)
Sodium: 137 mmol/L (ref 135–145)
Total Bilirubin: 0.5 mg/dL (ref 0.3–1.2)
Total Protein: 7 g/dL (ref 6.5–8.1)

## 2020-12-17 LAB — RAPID URINE DRUG SCREEN, HOSP PERFORMED
Amphetamines: NOT DETECTED
Barbiturates: NOT DETECTED
Benzodiazepines: NOT DETECTED
Cocaine: NOT DETECTED
Opiates: NOT DETECTED
Tetrahydrocannabinol: NOT DETECTED

## 2020-12-17 LAB — SALICYLATE LEVEL: Salicylate Lvl: <7 mg/dL — ABNORMAL LOW (ref 7.0–30.0)

## 2020-12-17 LAB — ACETAMINOPHEN LEVEL: Acetaminophen (Tylenol), Serum: <10 ug/mL — ABNORMAL LOW (ref 10–30)

## 2020-12-17 LAB — ETHANOL: Alcohol, Ethyl (B): <10 mg/dL (ref <10)

## 2020-12-17 MED ORDER — LORAZEPAM 2 MG/ML IJ SOLN
INTRAMUSCULAR | Status: AC
  Filled 2020-12-17: qty 1

## 2020-12-17 MED ORDER — DIPHENHYDRAMINE HCL 50 MG/ML IJ SOLN
INTRAMUSCULAR | Status: AC
  Administered 2020-12-17: 50 mg via INTRAMUSCULAR
  Filled 2020-12-17: qty 1

## 2020-12-17 MED ORDER — HALOPERIDOL LACTATE 5 MG/ML IJ SOLN
5.0000 mg | Freq: Once | INTRAMUSCULAR | Status: AC
  Administered 2020-12-17: 5 mg via INTRAMUSCULAR
  Filled 2020-12-17: qty 1

## 2020-12-17 MED ORDER — DIPHENHYDRAMINE HCL 50 MG/ML IJ SOLN: 50.0000 mg | Freq: Once | INTRAMUSCULAR | Status: AC

## 2020-12-17 MED ORDER — ZIPRASIDONE MESYLATE 20 MG IM SOLR
INTRAMUSCULAR | Status: AC
  Administered 2020-12-17: 20 mg via INTRAMUSCULAR
  Filled 2020-12-17: qty 20

## 2020-12-17 MED ORDER — LORAZEPAM 2 MG/ML IJ SOLN
2.0000 mg | Freq: Once | INTRAMUSCULAR | Status: AC
  Administered 2020-12-17: 2 mg via INTRAMUSCULAR

## 2020-12-17 MED ORDER — ZIPRASIDONE MESYLATE 20 MG IM SOLR: 20.0000 mg | Freq: Once | INTRAMUSCULAR | Status: AC

## 2020-12-17 MED ORDER — ZIPRASIDONE MESYLATE 20 MG IM SOLR
20.0000 mg | Freq: Once | INTRAMUSCULAR | Status: AC
  Administered 2020-12-17: 20 mg via INTRAMUSCULAR
  Filled 2020-12-17: qty 20

## 2020-12-17 MED ORDER — LORAZEPAM 1 MG PO TABS
2.0000 mg | ORAL_TABLET | Freq: Once | ORAL | Status: AC
  Administered 2020-12-17: 2 mg via ORAL
  Filled 2020-12-17: qty 2

## 2020-12-17 MED ORDER — STERILE WATER FOR INJECTION IJ SOLN
INTRAMUSCULAR | Status: AC
  Filled 2020-12-17: qty 10

## 2020-12-17 NOTE — ED Triage Notes (Signed)
Gpd stated, he was brought from [facility] for homicidal , 2 he threatened 2 [place] social workers. They took his sone away yesterday.

## 2020-12-17 NOTE — ED Notes (Signed)
Upon admission pt was extremely restless displaying manic type behavior, pressured speech, pacing, loud speech, episodes of elation and laughing. Pt will lay in bed for about 10 minutes snoring, and then be right up and pacing around the nurses station. He has made repeated phone calls. His speech is difficult to understand. He is demanding to leave AMA, security has been notified.

## 2020-12-17 NOTE — ED Notes (Signed)
Patient belongings are in locker #8. Belongings were not placed by this EMT.

## 2020-12-17 NOTE — Progress Notes (Signed)
TRIAGE: EMERGENT  Of note, Duty to Warn has NOT been called in at this time.   12/17/20 0645  [facility] Triage Screening (Walk-ins at [facility] only)  How Did You Hear About [place]? Legal System  What Is the Reason for Your Visit/Call Today? Pt was brought to the [facility] by [employer]. Pt states, "I was mad because my daughter was taken (by CPS)." Pt states the baby's mother is in [place], "figuring her things out." Pt states his daughter was taken by [facility] CPS worker Elvera Maria and her supervisor, Berneice Heinrich, and that he wants to kill them for taking his daughter. Clinician verified that pt truly wants to kill these workers and pt verified that, yes, he is going to attempt to kill them. Pt denies current SI, though he acknowledges he was suicidal last year when he was told he "was a shitty dad," in regards to his 1-or-70 year old son, whom he states is a prophet. Pt states he has attempted to kill himself in the past but he is unable to share when this occurred. He also states he was last hospitalized for mental health concerns in January 2022 in [place], though he was unable to identify why. Pt denies he has a plan to kill himself. He denies AVH. During the assessment, pt is pacing around the room and eventually sits on the floor and does sit-ups and push-ups. His laungage is disorganized at times and he stops and re-starts what he's attempting to say.  How Long Has This Been Causing You Problems? 1 wk - 1 month  Have You Recently Had Any Thoughts About Hurting Yourself? No  Are You Planning to Commit Suicide/Harm Yourself At This time? No  Have you Recently Had Thoughts About Hurting Someone Karolee Ohs? Yes  How long ago did you have thoughts of harming others? Currently  Are You Planning To Harm Someone At This Time? Yes  Explanation: Pt states his daughter was taken by CPS worker Elvera Maria and her supervisor, Berneice Heinrich, and that he wants to kill them for taking his daughter. Clinician verified that pt  truly wants to kill these workers and pt verified that, yes, he is going to attempt to kill them.  Are you currently experiencing any auditory, visual or other hallucinations? No  Have You Used Any Alcohol or Drugs in the Past 24 Hours?  (Not assessed)  Do you have any current medical co-morbidities that require immediate attention? No  Clinician description of patient physical appearance/behavior: Pt is casually dressed in an age-appropriate manner.  What Do You Feel Would Help You the Most Today? Medication(s);Treatment for Depression or other mood problem  If access to [facility] Urgent Care was not available, would you have sought care in the Emergency Department? Yes  Determination of Need Emergent (2 hours)  Options For Referral Medication Management;Inpatient Hospitalization;Outpatient Therapy
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
## 2020-12-17 NOTE — ED Provider Notes (Signed)
Emergency Medicine Provider Triage Evaluation Note  Jayde Daffin , a 45 y.o. male  was evaluated in triage.  Pt complains of homicidal ideations.  Patient reports he wants to kill his Child psychotherapist and has intent to go through with this plan.    Denies any illicit drug or alcohol use.  States he has been taking his prescribed medications.  Review of Systems  Positive: HI Negative: SI, AVH  Physical Exam  There were no vitals taken for this visit. Gen:   Awake, no distress   Resp:  Normal effort  MSK:   Moves extremities without difficulty  Other:  Patient is emotional labile   Medical Decision Making  Medically screening exam initiated at 11:10 AM.  Appropriate orders placed.  Jansel Menon was informed that the remainder of the evaluation will be completed by another provider, this initial triage assessment does not replace that evaluation, and the importance of remaining in the ED until their evaluation is complete.  Med clearance labs ordered    Berneice Heinrich 12/17/20 1112    Tegeler, Canary Brim, MD 12/17/20 1120

## 2020-12-17 NOTE — ED Notes (Signed)
Vital was meant for another pt, not this pt correct vitals. Accidentally click on another pt.

## 2020-12-17 NOTE — ED Provider Notes (Addendum)
°  Physical Exam  BP (!) 148/79 (BP Location: Right Arm)    Pulse 79    Temp 98.7 F (37.1 C) (Oral)    Resp 16    Ht 5\' 6"  (1.676 m)    Wt 110.7 kg    SpO2 100%    BMI 39.39 kg/m   Physical Exam  ED Course/Procedures     Procedures  MDM   Patient began yelling screaming growling and flipped over the bed.  Will give Haldol and Ativan for sedation  Patient was able to chew his way out of restraints.  More Haldol given      , MD 12/17/20 12/19/20, MD 12/17/20 [phone]

## 2020-12-17 NOTE — ED Provider Notes (Addendum)
[facility] EMERGENCY DEPARTMENT Provider Note   CSN: [id number] Arrival date & time: 12/17/20  3710     History Chief Complaint  Patient presents with   Homicidal    Chris Rogers is a 45 y.o. male with a past medical history of bipolar 1 disorder on monthly [name] injections presenting today from behavioral health after threatening to kill to social workers.  He tells me that they took his child from him yesterday because he "seemed very hyper."  States he has been having difficulty sleeping.  No thoughts of hurting himself however is having thoughts of killing the social worker and states "I will do it."  When asked about AVH he stated "I am going to write a book about it later."  Denies drug use or alcohol use.    Past Medical History:  Diagnosis Date   Bipolar 1 disorder (HCC)    Diabetes mellitus, type II (HCC)    Hypertension     Patient Active Problem List   Diagnosis Date Noted   Bipolar I disorder, most recent episode (or current) manic (HCC) 02/12/2020   Major depressive disorder, recurrent episode, moderate (HCC)    Bipolar I disorder, most recent episode depressed (HCC)    Affective psychosis, bipolar (HCC)     Past Surgical History:  Procedure Laterality Date   KNEE SURGERY         No family history on file.  Social History   Tobacco Use   Smoking status: Never   Smokeless tobacco: Never  Vaping Use   Vaping Use: Never used  Substance Use Topics   Alcohol use: Not Currently   Drug use: Not Currently    Home Medications Prior to Admission medications   Medication Sig Start Date End Date Taking? Authorizing Provider  amLODipine (NORVASC) 10 MG tablet Take 1 tablet (10 mg total) by mouth daily. 07/06/19   Jackelyn Poling, NP  ARIPiprazole ER ([name] MAINTENA) 400 MG SRER injection Inject 2 mLs (400 mg total) into the muscle every 30 (thirty) days. 05/20/20   Toy Cookey E, NP  BIOTIN PO Take 1 tablet by mouth daily.     [provider]  doxazosin (CARDURA) 8 MG tablet Take 8 mg by mouth daily. 10/29/18   [provider]  losartan-hydrochlorothiazide (HYZAAR) 100-25 MG tablet Take 1 tablet by mouth daily. 11/24/20   [provider]  Multiple Vitamins-Minerals (MULTIVITAMIN WITH MINERALS) tablet Take 1 tablet by mouth daily.    [provider]  Omega-3 Fatty Acids (FISH OIL OMEGA-3 PO) Take 1 capsule by mouth daily.    [provider]  VITAMIN E PO Take 1 tablet by mouth daily.    [provider]    Allergies    Invega [paliperidone], Olanzapine, and Topamax [topiramate]  Review of Systems   Review of Systems  Psychiatric/Behavioral:  Positive for behavioral problems (Homicidal), hallucinations and sleep disturbance. The patient is hyperactive.    Physical Exam Updated Vital Signs BP (!) 149/94 (BP Location: Right Arm)    Pulse 80    Temp 98.7 F (37.1 C) (Oral)    Resp 16    SpO2 100%   Physical Exam Vitals and nursing note reviewed.  Constitutional:      Appearance: Normal appearance.  HENT:     Head: Normocephalic and atraumatic.  Eyes:     General: No scleral icterus.    Conjunctiva/sclera: Conjunctivae normal.  Pulmonary:     Effort: Pulmonary effort is normal. No  respiratory distress.  Skin:    Findings: No rash.  Neurological:     Mental Status: He is alert.  Psychiatric:     Comments: With tangential speaking.  Nodding off throughout examination.  Manic appearing.    ED Results / Procedures / Treatments   Labs (all labs ordered are listed, but only abnormal results are displayed) Labs Reviewed  COMPREHENSIVE METABOLIC PANEL - Abnormal; Notable for the following components:      Result Value   Glucose, Bld 103 (*)    BUN 21 (*)    Creatinine, Ser 1.26 (*)    All other components within normal limits  SALICYLATE LEVEL - Abnormal; Notable for the following components:   Salicylate Lvl [id number] (*)    All other components within  normal limits  ACETAMINOPHEN LEVEL - Abnormal; Notable for the following components:   Acetaminophen (Tylenol), Serum <10 (*)    All other components within normal limits  ETHANOL  CBC  RAPID URINE DRUG SCREEN, HOSP PERFORMED    EKG None  Radiology No results found.  Procedures Procedures   Medications Ordered in ED Medications  ziprasidone (GEODON) injection 20 mg (has no administration in time range)    ED Course  I have reviewed the triage vital signs and the nursing notes.  Pertinent labs & imaging results that were available during my care of the patient were reviewed by me and considered in my medical decision making (see chart for details).    MDM Rules/Calculators/A&P Patient fully evaluated by me.  Appears to be manic, nodding off throughout the interview, likely due to sleep deprivation.  Stated that social work took his daughter due to manic behavior.  Has not missed any [name] shots and is medically clear in the department today.  UDS negative, ethanol negative.  Patient IVCd at this time by myself and MD [facility]. Reports "I am likely going to leave AMA."  When I told the patient he would be IVC if he attempted to leave he said "we will see."  Pacing around nursing station.  Continues to state "I will not be her [place] pig."  Was given Geodon and Benadryl previously and refusing to do so again and being "a [place] pig."  3:15: I was notified by the secretary that the patient is already under IVC.  They will use original paperwork.  RN reports patient is becoming aggressive and threatening to harm the nursing staff at the nursing station.  Geodon ordered at this time.  Final Clinical Impression(s) / ED Diagnoses Final diagnoses:  Homicidal ideation    Rx / DC Orders IVCD, awaiting placement    Romy Mcgue, [place], PA-C 12/17/20 1419    Ramere Downs A, PA-C 12/17/20 1519    Fredia Sorrow, MD 12/23/20 1311

## 2020-12-17 NOTE — ED Notes (Signed)
Pt had dinner. Pt is out of restraints at this time. Security at bedside. Pt is talking inappropriately to sitter. Pt made aware that it will not be tolerated.

## 2020-12-17 NOTE — ED Notes (Signed)
Pt got himself out of restraints with his teeth. He ran to the bathroom and sat on the floor. MD was notified. This RN administered 5mg  of Haldol IM and pt was redirected back to his bed and is eating dinner. Pt continues to display manic behavior

## 2020-12-17 NOTE — ED Notes (Signed)
Charice Osowski, pt's sister, pt would like his sister to be contacted with updates or in reference to his care

## 2020-12-17 NOTE — ED Provider Notes (Signed)
Behavioral Health Urgent Care Medical Screening Exam  Patient Name: Chris Rogers MRN: [id number] Date of Evaluation: 12/17/20 Chief Complaint:  HI Diagnosis:  Final diagnoses:  Bipolar I disorder, most recent episode (or current) manic (HCC)  Homicidal ideations    History of Present illness: Chris Rogers is a 45 y.o. male patient who presents to the [facility] voluntarily as a walk-in accompanied by [employer] with a chief complaint of daughter was taken by [facility] CPS worker Elvera Maria and her supervisor, Berneice Heinrich, and that he wants to kill them for taking his daughter   Patient seen and evaluated face-to-face by this provider with security present, chart reviewed and case discussed with Dr. Bronwen Betters. On evaluation, patient is verbally and physically aggressive. Patient is noted to be punching the walls/doors and screaming excessively.  Patient is alert and oriented x4. His thought process is logical and speech is pressured and loud. He has minimum eye contact. He appears disheveled. Mood is labile. He is noted to have high energy. Throughout the interview he is noted to be pacing and exercising. He endorses homicidal ideations with a plan to "choke out" the CPS worker Elvera Maria and her supervisor, Berneice Heinrich. He states that they removed his daughter who is less than 2 months old from his home. He states that as soon as he gets out he is going to kill them. He states, "I am going to kill her today, she is going to die." He states that he does not know why his daughter was removed from the home. He states, the baby's mother does not have parental rights due to abandonment.   He denies suicidal ideations. He endorses auditory hallucinations, "I hear a lot of things." He does not appear to be responding to internal or external stimuli. He reports poor sleep. He reports a fair appetite.   Patient has a hx of bipolar 1 dx. Patient is followed by Dr. Kathlene November,  NP., at [facility] outpatient. Patient is prescribed [name] ER 400 mg IM (last given on 12/14/20) and Remeron 45 mg QHS.   Patient was administered Geodon 20 mg IM once and Benadryl 50 mg IM once at 0732 am for aggressive behaviors. Patient received medications without restraints. Patient administered Ativan 2 mg po 0830 am for ongoing aggressive behaviors (screaming and punching doors). Patient stating, "let's go bitch." Patient is a danger to himself and others.    Psychiatric Specialty Exam  Presentation  General Appearance:Disheveled  Eye Contact:Minimal  Speech:Pressured  Speech Volume:Increased  Handedness:Left   Mood and Affect  Mood:Labile  Affect:Labile   Thought Process  Thought Processes:Coherent  Descriptions of Associations:Intact  Orientation:Full (Time, Place and Person)  Thought Content:WDL    Hallucinations:Auditory  Ideas of Reference:None  Suicidal Thoughts:No  Homicidal Thoughts:Yes, Active   Sensorium  Memory:Immediate Fair; Recent Fair; Remote Fair  Judgment:Poor  Insight:Poor   Executive Functions  Concentration:Poor  Attention Span:Poor  Recall:Fair  Fund of Knowledge:Fair  Language:Fair   Psychomotor Activity  Psychomotor Activity:Restlessness   Assets  Assets:Communication Skills; Desire for Improvement; Housing; Physical Health; Leisure Time; Social Support   Sleep  Sleep:Poor   Physical Exam: Physical Exam HENT:     Head: Normocephalic.     Nose: Nose normal.  Eyes:     Conjunctiva/sclera: Conjunctivae normal.  Cardiovascular:     Rate and Rhythm: Normal rate.  Pulmonary:     Effort: Pulmonary effort is normal.  Musculoskeletal:        General:  Normal range of motion.  Skin:    General: Skin is dry.  Neurological:     Mental Status: He is alert and oriented to person, place, and time.   Review of Systems  Constitutional: Negative.   HENT: Negative.    Eyes: Negative.   Respiratory: Negative.     Cardiovascular: Negative.   Skin: Negative.   Neurological: Negative.   Endo/Heme/Allergies: Negative.   Blood pressure (!) 129/92, pulse 99, temperature 97.8 F (36.6 C), temperature source Oral, resp. rate 20, SpO2 99 %. There is no height or weight on file to calculate BMI.  Musculoskeletal: Strength & Muscle Tone: within normal limits Gait & Station: normal Patient leans: N/A   [facility] MSE Discharge Disposition for Follow up and Recommendations: Based on my evaluation I certify that psychiatric inpatient services furnished can reasonably be expected to improve the patient's condition which I recommend transfer to an appropriate accepting facility.   Patient transferred to [facility] due to aggressive behaviors. Report called to Dr. Posey Rea at [facility]. Patient placed under IVC. Patient is recommended for inpatient psychiatric treatment.    Layla Barter, NP 12/17/2020, 7:46 AM

## 2020-12-17 NOTE — ED Notes (Signed)
Pt is asleep at this time. Off duty [employer] at bedside. Restraints are still off.

## 2020-12-17 NOTE — ED Notes (Signed)
Pt is asleep at this time.

## 2020-12-17 NOTE — Progress Notes (Signed)
No available/appropriate beds available at [facility] per [facility]   Patient meets inpatient criteria per Liborio Nixon, NP. Patient referred to the following facilities:  Destination Service Provider Address Phone Fax  [facility]  [address] [place] [address] [phone]  [facility]  [address] [place] [address] [phone]  [facility]  [address] [place] [address] [phone]  [facility]  [address], [place] [address] [phone]  [facility]  [address] [place] [address] [phone]  [facility]  [address], [place] [address] [phone]  [facility]  [address] [place] [address] [phone]  [facility]  [address] [place], [place] [address] [phone]  [facility]  [address] [place] [address] [phone]    CSW will continue to monitor disposition.   Signed:  Corky Crafts, MSW, [place], LCASA 12/17/2020 12:43 PM

## 2020-12-17 NOTE — ED Notes (Signed)
Pt has been placed in burgundy scrubs, 1 valuables envelope w/ cell phone, license a 2 [employer] business cards has been given to security. 2 belongings bags has been placed in locker 8

## 2020-12-17 NOTE — ED Notes (Signed)
Pt was on the phone with his significant other when he became extremely upset. He began walking off the unit, then rolled around on the floor, got up and flipped the bed over and went running down hall. Pt was able to be redirected to his bed and then began banging his head against the door. Pt was put on the stretcher, IM haldol and ativan was administered and soft restraints were placed. Pt is continuing to try to get out of restraints. MD aware

## 2020-12-17 NOTE — ED Notes (Signed)
Pt transferred over. Sleeping at this time. Restraints off

## 2020-12-18 LAB — RESP PANEL BY RT-PCR (FLU A&B, COVID) ARPGX2
Influenza A by PCR: NEGATIVE
Influenza B by PCR: NEGATIVE
SARS Coronavirus 2 by RT PCR: NEGATIVE

## 2020-12-18 MED ORDER — HALOPERIDOL LACTATE 5 MG/ML IJ SOLN
5.0000 mg | Freq: Once | INTRAMUSCULAR | Status: AC
  Administered 2020-12-18: 5 mg via INTRAMUSCULAR
  Filled 2020-12-18: qty 1

## 2020-12-18 MED ORDER — LORAZEPAM 2 MG/ML IJ SOLN: 2.0000 mg | Freq: Once | INTRAMUSCULAR | Status: AC

## 2020-12-18 MED ORDER — ZIPRASIDONE MESYLATE 20 MG IM SOLR
INTRAMUSCULAR | Status: AC
  Administered 2020-12-18: 20 mg via INTRAMUSCULAR
  Filled 2020-12-18: qty 20

## 2020-12-18 MED ORDER — ACETAMINOPHEN 325 MG PO TABS
650.0000 mg | ORAL_TABLET | Freq: Once | ORAL | Status: AC
  Administered 2020-12-18: 650 mg via ORAL
  Filled 2020-12-18: qty 2

## 2020-12-18 MED ORDER — ZIPRASIDONE MESYLATE 20 MG IM SOLR: 20.0000 mg | Freq: Once | INTRAMUSCULAR | Status: AC

## 2020-12-18 MED ORDER — LORAZEPAM 2 MG/ML IJ SOLN
2.0000 mg | Freq: Once | INTRAMUSCULAR | Status: AC
  Administered 2020-12-18: 2 mg via INTRAMUSCULAR
  Filled 2020-12-18: qty 1

## 2020-12-18 MED ORDER — LORAZEPAM 2 MG/ML IJ SOLN
1.0000 mg | Freq: Three times a day (TID) | INTRAMUSCULAR | Status: DC | PRN
  Administered 2020-12-18 – 2020-12-21 (×5): 1 mg via INTRAMUSCULAR
  Filled 2020-12-18 (×6): qty 1

## 2020-12-18 MED ORDER — STERILE WATER FOR INJECTION IJ SOLN
INTRAMUSCULAR | Status: AC
  Filled 2020-12-18: qty 10

## 2020-12-18 MED ORDER — HALOPERIDOL LACTATE 5 MG/ML IJ SOLN
10.0000 mg | Freq: Three times a day (TID) | INTRAMUSCULAR | Status: DC | PRN
  Administered 2020-12-18 – 2020-12-21 (×5): 10 mg via INTRAMUSCULAR
  Filled 2020-12-18 (×6): qty 2

## 2020-12-18 MED ORDER — DIPHENHYDRAMINE HCL 50 MG/ML IJ SOLN
50.0000 mg | Freq: Three times a day (TID) | INTRAMUSCULAR | Status: DC | PRN
  Administered 2020-12-18 – 2020-12-21 (×5): 50 mg via INTRAMUSCULAR
  Filled 2020-12-18 (×5): qty 1

## 2020-12-18 MED ORDER — LORAZEPAM 2 MG/ML IJ SOLN
INTRAMUSCULAR | Status: AC
  Administered 2020-12-18: 2 mg via INTRAMUSCULAR
  Filled 2020-12-18: qty 1

## 2020-12-18 MED ORDER — GABAPENTIN 100 MG PO CAPS
200.0000 mg | ORAL_CAPSULE | Freq: Two times a day (BID) | ORAL | Status: DC
  Administered 2020-12-19: 200 mg via ORAL
  Filled 2020-12-18 (×2): qty 2

## 2020-12-18 NOTE — ED Notes (Signed)
Patient making his 2nd phone call at this time.

## 2020-12-18 NOTE — ED Provider Notes (Signed)
Patient became very combative, tried to attack another patient and staff member, security alarm went off and it took multiple staff members to de-escalate and restrain the patient.  He did not sustain any injuries during this process.  Patient was placed in violent restraints and given additional sedative medicine.  Security now at bedside.  Patient currently calm, vitals remain normal.   Rozelle Logan, DO 12/18/20 0913

## 2020-12-18 NOTE — ED Notes (Signed)
Pt became upset over wanting to speak with a supervisor over wanting another phone call for the day. It was explained to the pt that only 2 phone calls are allowed and that the supervisor was made aware of his request. Pt became increasingly angry and stated he is going to go find the supervisor instead of waiting. Pt was asked to return to his room multiple times and refused and proceeded to walk out into the hallway. Security was requested and pt was re-directed to room.

## 2020-12-18 NOTE — Progress Notes (Signed)
Pt came out of room being loud, inappropriate and using vulgar language. Threatened to leave and walked out of unit. Security call and responded. Pt returned to purple zone and continued to be loud and inappropriate. Several attempts to redirect

## 2020-12-18 NOTE — ED Notes (Signed)
Bilateral ankle restraints released at this time. RN told pt if he continues to be calm, bilateral wrist restraints will be removed in the next 30 minutes. Will continue to monitor. Safety precautions maintained.

## 2020-12-18 NOTE — ED Notes (Signed)
Pt awake and at nurses station. Advised pt would be handing out med and advised pt of what he had ordered. Pt refused ordered meds stated he was not taking anymore meds that he wanted dones for his back pain. Advised would speak with provider reference same. Provided a cup of water. Started talking to [employer]. Requested to take pt VS and he was cooperative and went back to his room for same. Pt is c/o back pain

## 2020-12-18 NOTE — ED Notes (Signed)
Pt made a phone call and started getting angry while on the phone. Pt was redirected by staff at this time. Walked angrily to his room and slammed his fists on the stretcher. MD notified. Safety precautions maintained. Will continue to monitor.

## 2020-12-18 NOTE — ED Notes (Signed)
Patient is resting comfortably and sleeping

## 2020-12-18 NOTE — ED Notes (Signed)
Received verbal report from Cortney M RN at this time 

## 2020-12-18 NOTE — ED Notes (Addendum)
Pt went to the bathroom and was wandering in the unit. Redirected back to his room by staff. Pt was offered his gabapentin scheduled med, pt became angry, started screaming and cussing staff. Pt had his fists clenched and was about to fight staff. Pt was threatening to hurt staff at this time stating, "I'm going to kill you when I get out of here." Security notified and pt placed back on 4 pt violent restraints. MD notified. Will continue to monitor. Safety precautions maintained.

## 2020-12-18 NOTE — ED Notes (Signed)
Pt refusing medication , standing off with security and law enforcement. Stating hes going to run and taze him

## 2020-12-18 NOTE — ED Notes (Signed)
Ativan and haldol given per pt request for something to help him sleep

## 2020-12-18 NOTE — ED Notes (Signed)
Pt keeps on getting off of restraints. 4 pt violent restraints reapplied multiple times by staff and secured multiple times. Security at bedside this time. Pt still has some explosive outbursts frequently. Will continue to monitor. Safety precautions maintained.

## 2020-12-18 NOTE — ED Notes (Signed)
Pt awake and OOB. Up to BR. Given snack. Calm and cooperative at this time. Sitter present

## 2020-12-18 NOTE — ED Notes (Signed)
EKG shown to Dr. Horton.  

## 2020-12-18 NOTE — Progress Notes (Addendum)
Inpatient Behavioral Health Placement  Pt meets inpatient criteria per Liborio Nixon, NP. There are no appropriate beds at [facility] per [facility] Rosey Bath, RN. Referral was sent to the following facilities;    Addendum: Referral also sent to [facility].  [facility]  [address] [place], [place] [address] [phone]  [facility]  [address] [place] [address] [phone]  [facility]  [address], [place] [address] [phone]  [facility]  [address] [place], [place] [address] [phone]  [facility]  [address] [place] [address] [phone]  [facility] [address]  [address], [place] [address] [phone]  [facility]  [address] [place], [place] [address] [phone]   [facility]  [address] [place], [place] [address] [phone]  [facility]  [address], [place] [address] [phone]  [facility]  [address], [place] [address] [phone]  [facility]  [address], [place] [address] [phone]   Situation ongoing,  CSW will follow up.   Maryjean Ka, MSW, LCSWA 12/18/2020  @ 3:30 PM

## 2020-12-18 NOTE — ED Notes (Addendum)
Patient made his 1st phone call. 

## 2020-12-18 NOTE — ED Notes (Signed)
Pt out of room pacing. Yelling and cursing at this nurse. Punching the counter. Walked to the door and hit his head on the wall. Advises for this nurse not to give him anymore medication tonight that someone else can give it.

## 2020-12-18 NOTE — ED Notes (Signed)
Pt up ambulating in walkway swinging arms. Next thing he did was ambulate out of area. Law enforcement followed security called

## 2020-12-18 NOTE — ED Notes (Signed)
Patient received evening sandwich and drink

## 2020-12-18 NOTE — ED Notes (Signed)
Pt made his 2nd phone call when he became frustrated on the phone. Pt started cussing and screaming on the phone. Another pt came out of his room and told pt to keep quiet. Pt started walking towards the other pts room and started balling his fists. Pt was screaming that he is going to kill the other pt. Pt is very agitated at this time. Security notified. Multiple security officers came. Pt is not redirectable at this time. Pt continued to walk towards another pt at this time who made a remark about him. Pt was going to attack the other pts. Pt was making his hand movements like he was going to shoot the other pts. This time, security was stopping pt. Pt had to be restrained by multiple staff and security on the floor. Assisted back to his room and 4 pt violent restraints initiated at 0845AM. Dr.Horton notified of events.

## 2020-12-18 NOTE — ED Notes (Signed)
Pt was trying to get out of restraints at this time. Redirected by staff but pt started cussing and threatening staff. Security at bedside. Will continue to monitor.

## 2020-12-18 NOTE — ED Notes (Signed)
Pt asked staff for toothbrush and toothpaste when pt became agitated for taking so long. Pt became angry walking back and forth in his room and slammed his fists on the bed. Pt then started walking out of the unit and slammed the door in purple zone. Security notified. Pt stopped in the green zone and started calming down. Pt was redirected and escorted back to his room by staff. Pt is now in the shower. Will continue to monitor. Safety precautions maintained.

## 2020-12-18 NOTE — ED Notes (Signed)
Pt out on floor. Getting loud and using aggressive language. Pt walked out of purple zone. Security notified. Pt walked back in after a minute. Being vulgar and inappropriate.

## 2020-12-18 NOTE — ED Notes (Signed)
Pt returned to area with [employer] and security. Pt ambulating back and forth in area continuing to swing arms. Commenting about running and being tazed. Pt ambulated to restroom at this time. Security and [employer] at area charge nurse in area. Will medicate pt at this time

## 2020-12-18 NOTE — ED Notes (Signed)
Pt has been calm and cooperative at this time. 4 pt restraints removed. Pt ambulated to the bathroom and back to his room. Pt agrees to be calm and contracts for safety at this time. 1:1 with sitter continued. Will continue to monitor.

## 2020-12-18 NOTE — ED Notes (Signed)
Security and [employer] remain in the area. Pt continues to ambulate in and out of his room. Occasional using an aggressive tone. Talking about running

## 2020-12-18 NOTE — ED Notes (Signed)
Pt sitting at nurse station but now gone back to bed to lie down. Watching tv

## 2020-12-18 NOTE — ED Provider Notes (Addendum)
Emergency Medicine Observation Re-evaluation Note  Chris Rogers is a 45 y.o. male, seen on rounds today.  Pt initially presented to the ED for complaints of Homicidal Currently, the patient is agitated, pacing around his room, grunting.  Physical Exam  BP (!) 148/79 (BP Location: Right Arm)    Pulse 79    Temp 98.7 F (37.1 C) (Oral)    Resp 16    Ht 5\' 6"  (1.676 m)    Wt 110.7 kg    SpO2 100%    BMI 39.39 kg/m  Physical Exam General: walking around the room, trying to leave his room Lungs: normal WOB Psych: agitated  ED Course / MDM  EKG:   I have reviewed the labs performed to date as well as medications administered while in observation.  Recent changes in the last 24 hours include none.  Plan  Current plan is for TTS evaluation and recommendations. Chris Rogers is under involuntary commitment.      , DO 12/18/20 0827    12/20/20, DO 12/18/20 0830

## 2020-12-19 ENCOUNTER — Other Ambulatory Visit: Payer: Self-pay

## 2020-12-19 DIAGNOSIS — F319 Bipolar disorder, unspecified: Secondary | ICD-10-CM

## 2020-12-19 DIAGNOSIS — F4324 Adjustment disorder with disturbance of conduct: Secondary | ICD-10-CM

## 2020-12-19 MED ORDER — ACETAMINOPHEN 325 MG PO TABS
650.0000 mg | ORAL_TABLET | Freq: Four times a day (QID) | ORAL | Status: DC | PRN
  Administered 2020-12-19 – 2020-12-22 (×2): 650 mg via ORAL
  Filled 2020-12-19 (×2): qty 2

## 2020-12-19 MED ORDER — GABAPENTIN 300 MG PO CAPS
300.0000 mg | ORAL_CAPSULE | Freq: Two times a day (BID) | ORAL | Status: DC
  Administered 2020-12-19 – 2020-12-20 (×2): 300 mg via ORAL
  Filled 2020-12-19 (×2): qty 1

## 2020-12-19 NOTE — ED Notes (Signed)
Pt threatening to hurt another pt. This RN gave PRN meds. Will continue to monitor.

## 2020-12-19 NOTE — ED Notes (Addendum)
While taking PT's  vital signs,  PT stated he was going to [place] the CPS worker  Elvera Maria that took his I month old baby from him. This NT informed PT that it would not help him see his baby again, PT stated he didn't care she has to die and that's that. PT would not speak any further on the subject.

## 2020-12-19 NOTE — ED Notes (Signed)
Patient asking about another phone call, informed he had already had his 2 phone calls for the day. Patient went into his room, punched the walls 3-4 times, then walked off the unit. Security and [employer] officer followed. Patient escorted back to unit

## 2020-12-19 NOTE — ED Notes (Signed)
Pt up to nurses station talking to [employer] at this time

## 2020-12-19 NOTE — ED Notes (Signed)
Pt continuously coming out and antagonizing the pt in room 51. Security is at bedside. Pt has been advised to stay in his room for now and do not come out. Pt provided with a urinal for bathroom purposes. This RN will continue to monitor.

## 2020-12-19 NOTE — ED Notes (Signed)
Patient quietly sitting and talking with sitter

## 2020-12-19 NOTE — Progress Notes (Signed)
Per Chris Rogers, patient meets criteria for inpatient treatment. There are no available or appropriate beds at [facility] today. CSW faxed referrals to the following facilities for review:   [facility]  Pending - Request Sent N/A [address] [place] [address] [phone] --  [facility]  Pending - Request Sent N/A [address] [place] [address] [phone] --  [facility]  Pending - Request Sent N/A [address], [place] [address] [phone] --  [facility]  Pending - Request Sent N/A [address] [place] [address] [phone] --  [facility]  Pending - Request Sent N/A [address] [place] [address] [phone] --  [facility]  Pending - Request Sent N/A [address], [place] [address] [phone] --  [facility]  Pending - Request Sent N/A [address] [place], [place] [address] [phone] --  [facility]  Pending - Request Sent N/A [address] [place], [place] [address] [phone] --  [facility]  Pending - Request Sent N/A [address] [place] [address] [phone] --  [facility]  Pending - Request Sent N/A [address] [place] [address] [phone] --  [facility]  Pending - Request Sent N/A [address] [place], [place] [address] [phone] --  [facility]  Pending - Request Sent N/A [address], [place] [address] [phone] --  [facility]  Pending - Request Sent N/A [address] [place] [address] [phone] --  [facility]  Pending - Request Sent N/A [address] [place] [address] [phone] --  [facility]  Pending - Request Sent N/A [address], [place] [address] [phone] --  [facility]  Pending - No Request Sent N/A [address] [place] [address] [phone] --  [facility]  Pending - No Request Sent N/A [address] [place] [address] [phone] --  [facility]  Pending - No Request Sent N/A [address] [place] [address] [phone] --  [facility]  Pending - No Request Sent N/A [address], [place] [address] [phone] --  [facility]  Pending - No Request Sent N/A [address] [place] [address] [phone] --  [facility]  Pending - No Request [facility] [address] [place] [address] [phone] --  [facility]  Pending - No Request Sent N/A [address] [place] [address] [phone] --  [facility]  Pending - No Request Sent N/A [address] [place] [address] [phone] --   TTS will continue to seek bed placement.    Chris Rogers, MSW, LCSW-A, LCAS-A Phone: [phone] Disposition/TOC

## 2020-12-19 NOTE — Consult Note (Signed)
Telepsych Consultation   Reason for Consult:  Psychiatric Reassessment for homicidal ideations Referring Physician:  Haskel Schroeder, PA-C Location of Patient:    [facility] Location of Provider: Other: virtual home office  Patient Identification: Dekendrick Uzelac MRN:  [id number] Principal Diagnosis: Adjustment disorder with disturbance of conduct Diagnosis:  Principal Problem:   Adjustment disorder with disturbance of conduct Active Problems:   Bipolar 1 disorder (HCC)   Total Time spent with patient: 30 minutes  Subjective:   Chris Rogers is a 45 y.o. male patient admitted with suicidal ideations towards [facility] CPS workers Peridot Bullins and her supervisor, Berneice Heinrich who picked up his 23 month old daughter.Patient states, "I'm gonna kill that B&%ch."   Patient seen via telepsych by this provider; chart reviewed and consulted with Dr. Lucianne Muss on 12/19/20.  On evaluation Chris Rogers reports he is still upset with CPS workers, as he believes they removed his 21 month old daughter, "Justice" without cause.  He does admit to having problems sleeping over the few weeks; states he's been asking his family for help but did not get this.   Since admission, patient's behavior has been unpredictable due to agitation related to CPS taking his baby.  He has been verbally and physically aggressive, has not assaulted staff but attempting to destroy hospital property, required prn restraints and medications.  We spoke in detail about his behaviors and how they are incongruent with his desire for discharge and to regain custody of his daughter.  He verbalizes his understanding, is very talkative, has short attention span and demonstrates difficulty remaining on topic.  He states he received aripiprazole LAI on 12/12, records substantiate this.  While in the ED, he was started on gabapentin to help with back pain and irritability.  He's been tolerating meds well without side effects.  Appetite is good; sleep remains fair.     HPI:  Per Provider Admission Assessment 12/17/2020 Chief Complaint  Patient presents with   Homicidal      Chris Rogers is a 45 y.o. male with a past medical history of bipolar 1 disorder on monthly [name] injections presenting today from behavioral health after threatening to kill to social workers.  He tells me that they took his child from him yesterday because he "seemed very hyper."  States he has been having difficulty sleeping.  No thoughts of hurting himself however is having thoughts of killing the social worker and states "I will do it."  When asked about AVH he stated "I am going to write a book about it later."  Denies drug use or alcohol use.   Past Psychiatric History: as outlined below  Risk to Self:  no Risk to Others:  yes Prior Inpatient Therapy: yes  Prior Outpatient Therapy:  yes  Past Medical History:  Past Medical History:  Diagnosis Date   Bipolar 1 disorder (HCC)    Diabetes mellitus, type II (HCC)    Hypertension     Past Surgical History:  Procedure Laterality Date   KNEE SURGERY     Family History: No family history on file. Family Psychiatric  History: unknown Social History:  Social History   Substance and Sexual Activity  Alcohol Use Not Currently     Social History   Substance and Sexual Activity  Drug Use Not Currently    Social History   Socioeconomic History   Marital status: Divorced    Spouse name: Not on file   Number of children: Not on  file   Years of education: Not on file   Highest education level: Not on file  Occupational History   Not on file  Tobacco Use   Smoking status: Never   Smokeless tobacco: Never  Vaping Use   Vaping Use: Never used  Substance and Sexual Activity   Alcohol use: Not Currently   Drug use: Not Currently   Sexual activity: Not on file  Other Topics Concern   Not on file  Social History Narrative   Not on file   Social Determinants of Health    Financial Resource Strain: Not on file  Food Insecurity: Not on file  Transportation Needs: Not on file  Physical Activity: Not on file  Stress: Not on file  Social Connections: Not on file   Additional Social History:    Allergies:   Allergies  Allergen Reactions   Invega [Paliperidone] Other (See Comments)    "manic episode"   Olanzapine Other (See Comments)    Mania   Topamax [Topiramate] Other (See Comments)    Patient states "glaucoma like symptoms" and blindness    Labs:  Results for orders placed or performed during the hospital encounter of 12/17/20 (from the past 48 hour(s))  Resp Panel by RT-PCR (Flu A&B, Covid) Nasopharyngeal Swab     Status: None   Collection Time: 12/18/20 10:41 AM   Specimen: Nasopharyngeal Swab; Nasopharyngeal(NP) swabs in vial transport medium  Result Value Ref Range   SARS Coronavirus 2 by RT PCR NEGATIVE NEGATIVE    Comment: (NOTE) SARS-CoV-2 target nucleic acids are NOT DETECTED.  The SARS-CoV-2 RNA is generally detectable in upper respiratory specimens during the acute phase of infection. The lowest concentration of SARS-CoV-2 viral copies this assay can detect is 138 copies/mL. A negative result does not preclude SARS-Cov-2 infection and should not be used as the sole basis for treatment or other patient management decisions. A negative result may occur with  improper specimen collection/handling, submission of specimen other than nasopharyngeal swab, presence of viral mutation(s) within the areas targeted by this assay, and inadequate number of viral copies(<138 copies/mL). A negative result must be combined with clinical observations, patient history, and epidemiological information. The expected result is Negative.  Fact Sheet for Patients:  BloggerCourse.com  Fact Sheet for Healthcare Providers:  SeriousBroker.it  This test is no t yet approved or cleared by the [place] FDA and  has been authorized for detection and/or diagnosis of SARS-CoV-2 by FDA under an Emergency Use Authorization (EUA). This EUA will remain  in effect (meaning this test can be used) for the duration of the COVID-19 declaration under Section 564(b)(1) of the Act, 21 U.S.C.section 360bbb-3(b)(1), unless the authorization is terminated  or revoked sooner.       Influenza A by PCR NEGATIVE NEGATIVE   Influenza B by PCR NEGATIVE NEGATIVE    Comment: (NOTE) The Xpert Xpress SARS-CoV-2/FLU/RSV plus assay is intended as an aid in the diagnosis of influenza from Nasopharyngeal swab specimens and should not be used as a sole basis for treatment. Nasal washings and aspirates are unacceptable for Xpert Xpress SARS-CoV-2/FLU/RSV testing.  Fact Sheet for Patients: BloggerCourse.com  Fact Sheet for Healthcare Providers: SeriousBroker.it  This test is not yet approved or cleared by the [place] FDA and has been authorized for detection and/or diagnosis of SARS-CoV-2 by FDA under an Emergency Use Authorization (EUA). This EUA will remain in effect (meaning this test can be used) for the duration of the COVID-19 declaration under Section  564(b)(1) of the Act, 21 U.S.C. section 360bbb-3(b)(1), unless the authorization is terminated or revoked.  Performed at [facility], [address] [place], [place] [address]     Medications:  Current Facility-Administered Medications  Medication Dose Route Frequency Provider Last Rate Last Admin   acetaminophen (TYLENOL) tablet 650 mg  650 mg Oral Q6H PRN Garlon Hatchet, PA-C   650 mg at 12/19/20 3419   ARIPiprazole ER ([name] MAINTENA) 400 MG prefilled syringe 400 mg  400 mg Intramuscular Q28 days Toy Cookey E, NP   400 mg at 12/14/20 1030   haloperidol lactate (HALDOL) injection 10 mg  10 mg Intramuscular TID PRN Chales Abrahams, NP   10 mg at 12/19/20 3790   And    diphenhydrAMINE (BENADRYL) injection 50 mg  50 mg Intramuscular TID PRN Chales Abrahams, NP   50 mg at 12/19/20 2409   And   LORazepam (ATIVAN) injection 1 mg  1 mg Intramuscular TID PRN Chales Abrahams, NP   1 mg at 12/19/20 1027   gabapentin (NEURONTIN) capsule 300 mg  300 mg Oral BID Chales Abrahams, NP       Current Outpatient Medications  Medication Sig Dispense Refill   amLODipine (NORVASC) 10 MG tablet Take 1 tablet (10 mg total) by mouth daily. 14 tablet 0   ARIPiprazole ER ([name] MAINTENA) 400 MG SRER injection Inject 2 mLs (400 mg total) into the muscle every 30 (thirty) days. 1 each 9   BIOTIN PO Take 1 tablet by mouth daily.     doxazosin (CARDURA) 8 MG tablet Take 8 mg by mouth daily.     losartan-hydrochlorothiazide (HYZAAR) 100-25 MG tablet Take 1 tablet by mouth daily.     Multiple Vitamins-Minerals (MULTIVITAMIN WITH MINERALS) tablet Take 1 tablet by mouth daily.     Omega-3 Fatty Acids (FISH OIL OMEGA-3 PO) Take 1 capsule by mouth daily.     VITAMIN E PO Take 1 tablet by mouth daily.      Musculoskeletal: Strength & Muscle Tone: within normal limits Gait & Station: normal Patient leans: N/A  Psychiatric Specialty Exam:  Presentation  General Appearance: Fairly Groomed  Eye Contact:Good  Speech:Clear and Coherent; Pressured  Speech Volume:Normal  Handedness:Right   Mood and Affect  Mood:Anxious  Affect:Congruent (does smile and demonstrate wide range of emotion during assessment)   Thought Process  Thought Processes:Goal Directed  Descriptions of Associations:Intact  Orientation:Full (Time, Place and Person)  Thought Content:Illogical (continues to verbalized homicidal intent to CPS worker that picked up his daughter)  History of Schizophrenia/Schizoaffective disorder:No data recorded Duration of Psychotic Symptoms:No data recorded Hallucinations:Hallucinations: None  Ideas of Reference:None  Suicidal Thoughts:Suicidal Thoughts:  No  Homicidal Thoughts:Homicidal Thoughts: Yes, Active HI Active Intent and/or Plan: With Intent   Sensorium  Memory:Immediate Good; Recent Good; Remote Good  Judgment:Impaired  Insight:Lacking   Executive Functions  Concentration:Fair  Attention Span:Poor  Recall:Good  Fund of Knowledge:Fair  Language:Good   Psychomotor Activity  Psychomotor Activity:Psychomotor Activity: Restlessness   Assets  Assets:Communication Skills; Housing; Social Support   Sleep  Sleep:Sleep: Fair Number of Hours of Sleep: 5    Physical Exam: Physical Exam Constitutional:      Appearance: Normal appearance.  Cardiovascular:     Rate and Rhythm: Normal rate.     Pulses: Normal pulses.  Pulmonary:     Effort: Pulmonary effort is normal.  Musculoskeletal:        General: Normal range of motion.  Cervical back: Normal range of motion.  Neurological:     General: No focal deficit present.     Mental Status: He is alert and oriented to person, place, and time.  Psychiatric:        Attention and Perception: Perception normal. He is inattentive.        Mood and Affect: Mood is anxious.        Speech: Speech is rapid and pressured.        Behavior: Behavior is hyperactive. Behavior is cooperative.        Thought Content: Thought content is not paranoid or delusional. Thought content includes homicidal ideation. Thought content does not include suicidal ideation. Thought content includes homicidal plan. Thought content does not include suicidal plan.        Cognition and Memory: Cognition and memory normal.        Judgment: Judgment is impulsive and inappropriate.   Review of Systems  Constitutional: Negative.   HENT: Negative.    Eyes: Negative.   Respiratory: Negative.    Cardiovascular: Negative.   Gastrointestinal: Negative.   Genitourinary: Negative.   Musculoskeletal:  Positive for back pain (chronic present prior to hospital arrival).  Skin: Negative.   Neurological:  Negative.   Endo/Heme/Allergies: Negative.   Psychiatric/Behavioral:  Negative for hallucinations, substance abuse and suicidal ideas. The patient is nervous/anxious and has insomnia.   Blood pressure (!) 142/100, pulse 85, temperature 98.2 F (36.8 C), resp. rate 14, height 5\' 6"  (1.676 m), weight 110.7 kg, SpO2 98 %. Body mass index is 39.39 kg/m.  Treatment Plan Summary: Patient continues to voice homicidal ideations towards CPS workers and for safety.  Based on this, he continues to meet criteria for inpatient admission.  This was discussed with the patient who fixates on getting is daughter back. Per [facility] his behaviors are too acute for placement there.  He has been faxed out to other facilities.  In the interim, psychiatry will continue to monitor him while in the ED for safety and mood stability.    Daily contact with patient to assess and evaluate symptoms and progress in treatment and Medication management  Medications: Aripiprazole 400mg  LAI q 28 days; last dose received 12/12 Gabapentin 300mg  po TID mood/back pain  Did not add trileptal for anger/irritability due to potential for significant interaction with aripiprazole; he has allergies to topamax. Patient placed on gabapentin, to help with anger and chronic back pain.   Disposition: Recommend psychiatric Inpatient admission when medically cleared.  This service was provided via telemedicine using a 2-way, interactive audio and video technology.  Names of all persons participating in this telemedicine service and their role in this encounter. Name: Keymon Mcelroy Role: Patient  Name: Role: PMHNP    , NP 12/19/2020 12:34 PM

## 2020-12-19 NOTE — ED Notes (Signed)
-  patient received night time snack

## 2020-12-19 NOTE — ED Notes (Addendum)
Per pt he is requesting that his information do not be released to [employer] and [employer]. He states he does not want his information to be released to anyone with CPS.Per pt we can release information to [employer], Violet Baldy, Hattela Cure.

## 2020-12-19 NOTE — ED Notes (Signed)
Patient playing cards with another patient and NT

## 2020-12-19 NOTE — ED Notes (Signed)
Pt is requesting doans for back checked with pharmacy this is not available. Pt aware of this and is advising he will take tylenol for his pain. Message sent to doctor reference same

## 2020-12-19 NOTE — ED Notes (Signed)
Dinner arrived. 

## 2020-12-19 NOTE — ED Notes (Signed)
This pt came out of his room and started antagonizing the pt in room 51. [employer] standing with pt this RN standing with the pt from room 51. Security was called and now is at bedside. Both pt's were yelling at each other. This RN will continue to monitor.

## 2020-12-20 ENCOUNTER — Encounter (HOSPITAL_COMMUNITY): Payer: Self-pay

## 2020-12-20 MED ORDER — AMLODIPINE BESYLATE 5 MG PO TABS
10.0000 mg | ORAL_TABLET | Freq: Every day | ORAL | Status: DC
  Administered 2020-12-20 – 2020-12-22 (×3): 10 mg via ORAL
  Filled 2020-12-20 (×3): qty 2

## 2020-12-20 MED ORDER — DOXAZOSIN MESYLATE 8 MG PO TABS
8.0000 mg | ORAL_TABLET | Freq: Every day | ORAL | Status: DC
  Administered 2020-12-20 – 2020-12-22 (×3): 8 mg via ORAL
  Filled 2020-12-20 (×3): qty 1

## 2020-12-20 MED ORDER — DIVALPROEX SODIUM 250 MG PO DR TAB
250.0000 mg | DELAYED_RELEASE_TABLET | Freq: Every day | ORAL | Status: DC
  Administered 2020-12-20 – 2020-12-22 (×3): 250 mg via ORAL
  Filled 2020-12-20 (×3): qty 1

## 2020-12-20 MED ORDER — LOSARTAN POTASSIUM 50 MG PO TABS
100.0000 mg | ORAL_TABLET | Freq: Every day | ORAL | Status: DC
  Administered 2020-12-20 – 2020-12-22 (×3): 100 mg via ORAL
  Filled 2020-12-20 (×3): qty 2

## 2020-12-20 MED ORDER — LOSARTAN POTASSIUM-HCTZ 100-25 MG PO TABS: 1.0000 | ORAL_TABLET | Freq: Every day | ORAL | Status: DC

## 2020-12-20 MED ORDER — GABAPENTIN 300 MG PO CAPS
300.0000 mg | ORAL_CAPSULE | Freq: Three times a day (TID) | ORAL | Status: DC
  Administered 2020-12-20 – 2020-12-22 (×6): 300 mg via ORAL
  Filled 2020-12-20 (×6): qty 1

## 2020-12-20 MED ORDER — HYDROCHLOROTHIAZIDE 25 MG PO TABS
25.0000 mg | ORAL_TABLET | Freq: Every day | ORAL | Status: DC
  Administered 2020-12-20 – 2020-12-22 (×3): 25 mg via ORAL
  Filled 2020-12-20 (×3): qty 1

## 2020-12-20 NOTE — Progress Notes (Signed)
CSW followed up with Mannie Stabile in reference to referral sent for this patient. It was reported that this patient was decline due to no appropriate beds available at this time.  Crissie Reese, MSW, LCSW-A, LCAS-A Phone: [phone] Disposition/TOC

## 2020-12-20 NOTE — ED Notes (Signed)
Provided with [place] sandwich water and peanut butter graham crackesr

## 2020-12-20 NOTE — ED Notes (Signed)
Pt became acutely agitated, verbally trading threats and escalating with pt in adjacent room. Pts separated. Security called. Pt verbally deescalated, still in visibly agitated/pacing/clenching fists. Pt requesting medication and to be moved to a different area. PRN medication given, charge notified. Pt demonstrating poor impulse control, explosive anger, but is trying to self regulate.

## 2020-12-20 NOTE — ED Notes (Signed)
Pt given crossword puzzles for entertainment.

## 2020-12-20 NOTE — ED Notes (Signed)
Pt approached RN asking about plan. RN explained IVC status and next steps. During conversation another pt on the unit yelled out of room at patient, escalating situation. These two patients argued earlier in the night, increasing agitation level for both pts. Charge nurse notified of increasing tension between these two pts.

## 2020-12-20 NOTE — BH Assessment (Signed)
TTS spoke to [facility], to put Pt in  a Private room to complete TTS re-assessment.  Nurse will call back, when she is ready.

## 2020-12-20 NOTE — ED Notes (Signed)
Pt up to restroom, cleaning room, provided snacks and drink.

## 2020-12-20 NOTE — BH Assessment (Signed)
Reassessment Pt was seen this date and reassessed.  Pt continues to be agitated about the whereabouts of his daughter. Pt denied HI, "I want my daughter, I am still angry today, I am more in control today".  Pt denied SI or AVH.  Pt is observed to be more anxious, rolling his hands; also, fixed on obtaining his daughter.  Pt gave TTS permission to contact his mother, Clarice Heart, [phone] as it related to his living environment.  Pt mother reports "he is not stable to come to my home at this time, when he is stable he could come to my house".  Disposition: Roselie Skinner NP, recommends inpatient  and continued observation. Disposition discussed with Neurosurgeon, via secure chat in Epic.  RN to discuss disposition with EDP.

## 2020-12-20 NOTE — ED Provider Notes (Signed)
Emergency Medicine Observation Re-evaluation Note  Chris Rogers is a 45 y.o. male, seen on rounds today.  Pt initially presented to the ED for complaints of Homicidal Currently, the patient is in room. States he feels well.   Physical Exam  BP 109/67    Pulse 82    Temp 98 F (36.7 C)    Resp 18    Ht 5\' 6"  (1.676 m)    Wt 110.7 kg    SpO2 100%    BMI 39.39 kg/m  Physical Exam CONSTITUTIONAL:  well-appearing, NAD NEURO:  Alert and oriented x 3, no focal deficits EYES:  pupils equal and reactive ENT/NECK:  trachea midline, no JVD CARDIO:  PULM:  None labored breathing GI/GU:  Abdomen non-distended MSK/SPINE:  No gross deformities, no edema SKIN:  no rash obvious, atraumatic, no ecchymosis  PSYCH:  Appropriate speech and behavior   ED Course / MDM  EKG:EKG Interpretation  Date/Time:  Friday December 18 2020 09:24:17 EST Ventricular Rate:  84 PR Interval:  190 QRS Duration: 102 QT Interval:  358 QTC Calculation: 423 R Axis:   38 Text Interpretation: Normal sinus rhythm Possible Anterior infarct , age undetermined Abnormal ECG NSR, normal QTc Confirmed by 06-25-1995 [phone]) on 12/18/2020 11:11:15 AM  I have reviewed the labs performed to date as well as medications administered while in observation.  Recent changes in the last 24 hours include  He did require some medications for agitation 4 AM this morning.  Plan  Current plan is for psychiatric admission. PATIENT IS MEDICALLY CLEAR AT THIS TIME. Chris Rogers is under involuntary commitment.   Seems motivated to be discharged home.     12/20/2020 [place], DOLE 12/23/20 12/25/20    [id number], MD 12/29/20 1550

## 2020-12-20 NOTE — ED Notes (Signed)
Pt given dinner tray.

## 2020-12-20 NOTE — ED Notes (Signed)
TTS re eval in progress in room 46

## 2020-12-20 NOTE — ED Notes (Signed)
Pt anxious, hyperverbal and talkative. A/ox4, cooperative at this time. Pt requesting Sprite, Tv and/or something to read. Pt informed will fulfill requests if possible. Pt verbalizes understanding

## 2020-12-21 ENCOUNTER — Telehealth (HOSPITAL_COMMUNITY): Payer: Self-pay | Admitting: Emergency Medicine

## 2020-12-21 NOTE — ED Provider Notes (Signed)
Emergency Medicine Observation Re-evaluation Note  Chris Rogers is a 45 y.o. male, seen on rounds today.  Pt initially presented to the ED for complaints of Homicidal Currently, the patient is awake, eating breakfast.  Physical Exam  BP (!) 110/58 (BP Location: Left Arm)    Pulse 61    Temp 98.6 F (37 C) (Oral)    Resp 16    Ht 5\' 6"  (1.676 m)    Wt 110.7 kg    SpO2 100%    BMI 39.39 kg/m  Physical Exam General: Awake, alert, calm Cardiac: Extremities well perfused Lungs: Breathing is even and unlabored Psych: No agitation, does not appear to responding to internal stimuli  ED Course / MDM  EKG:EKG Interpretation  Date/Time:  Friday December 18 2020 09:24:17 EST Ventricular Rate:  84 PR Interval:  190 QRS Duration: 102 QT Interval:  358 QTC Calculation: 423 R Axis:   38 Text Interpretation: Normal sinus rhythm Possible Anterior infarct , age undetermined Abnormal ECG NSR, normal QTc Confirmed by 06-25-1995 [phone]) on 12/18/2020 11:11:15 AM  I have reviewed the labs performed to date as well as medications administered while in observation.  Recent changes in the last 24 hours include TTS reevaluation yesterday.  Plan  Current plan is for inpatient psychiatric admission. Chris Rogers is under involuntary commitment.      12/20/2020, MD 12/21/20 [phone]

## 2020-12-21 NOTE — ED Notes (Signed)
Pt was becoming agitated on phone call but managed to keep it under control and get himself off the phone when he recognized that I was paying attn.

## 2020-12-21 NOTE — ED Notes (Signed)
Pt has been calm since phone call.  He ate dinner and is resting.

## 2020-12-21 NOTE — ED Notes (Signed)
Per request pts cell phone retrieved from security for this RN to get number out of, along with numbers to [employer] numbers.   Belongings locked back up in envelope # ([id number]) and returned to security.  Valuables envelope include:  1 iphone without case but with cracked screen 1 driver license 2 [employer] cards

## 2020-12-21 NOTE — ED Notes (Signed)
NT assisting pt to take a shower

## 2020-12-21 NOTE — Progress Notes (Signed)
Inpatient Behavioral Health Placement  Pt meets inpatient criteria per Shuvon Rankin,NP. There are no appropriate beds at [facility]. Referral was sent to the following facilities;   Destination Service Provider Address Phone Fax  [facility]  [address] [place] [address] [phone]  [facility]  [address] [place] [address] [phone]  [facility]  [address] [place] [address] [phone]  [facility]  [address] [place] [address] [phone]  [facility]  [address] [place], [place] [address] [phone]  [facility]  [address] [place] [address] [phone]  [facility]  [address], [place] [address] [phone]  [facility]  [address] [place], [place] [address] [phone]  [facility]  [address] [place] [address] [phone]  [facility] [address]  [address], [place] [address] [phone]  [facility]  [address] [place], [place] [address] [phone]  [facility]  [address] [place], [place] [address] [phone]  [facility]  [address], [place] [address] [phone]  [facility]  [address], [place] [address] [phone]  [facility]  [address], [place] [address] [phone]  [facility]  [address] [place] [address] [phone]  [facility]  [address] [place] [address]  [phone]  [facility]  [address] [place] [address] [phone]  [facility]  [address] [place] [address] [phone]  [facility]  [address] [place] [address] [phone]  [facility] [place] [address] [phone]  [facility]  [address], [place] [address] [phone]  [facility]  [address], [place] [address] [phone]    Situation ongoing,  CSW will follow up.   Maryjean Ka, MSW, [facility] 12/21/2020  @ 11:48 PM

## 2020-12-21 NOTE — ED Notes (Signed)
Pt has occasionally gotten up to to talk with staff at desk but has been calm and cooperative since I assumed care at 1900.

## 2020-12-21 NOTE — Progress Notes (Signed)
CSW contacted [place] CPS and spoke with Norvel Richards who stated patient can call her and get details on his case and child. Patient made a threat against Ms. Troy Sine stating that he was going to kill her. For safety reasons patient will not be able to know the location of his child at this time. Patient can call CPS in [place].

## 2020-12-21 NOTE — Consult Note (Signed)
Telepsych Consultation   Reason for Consult:  Psychiatric Reassessment for homicidal ideations Referring Physician:  Loni Beckwith, PA-C Location of Patient: [facility] ED Location of Provider: Other: [facility]  Patient Identification: Walberto Fitton MRN:  [id number] Principal Diagnosis: Adjustment disorder with disturbance of conduct Diagnosis:  Principal Problem:   Adjustment disorder with disturbance of conduct Active Problems:   Bipolar 1 disorder ([place])   Total Time spent with patient: 30 minutes  Subjective:   Nahmir Casteen is a 45 y.o. male patient admitted to [facility] ED with suicidal ideations towards [place] workers [employer] and her supervisor, Jacqlyn Krauss who pick up his one month old daughter.  Patient states, "I'm gonna kill that bitch.".  HPI:  Antoin Mccullers, 45 y.o., male patient seen for psychiatric reassessment via tele health by this provider, consulted with Dr. Hampton Abbot; and chart reviewed on 12/21/20.  On evaluation Hobert Vanzanten reports he is upset that his daughter was taken by CPS in [facility] and no one has talked to him about anything.  States he brought daughter home from hospital and she has been apart from him since.  "She is one month old.  I don't know where she is she could be with a pervert.  I don't want her with her with her mother or anybody I do not know."  Patient is also upset related to the way he is being treated in the ED.  Reports he is only been able to make 2 phone call today and that is not enough to find out what is going on with his daughter.  Patient is cursing stating that he wanted to pace or walk but was told he had to stay in his room and if he is not in his room he is threatening with getting an injection for not listening.  Reports he then becomes agitated and starts cursing staff. During evaluation Garrette Karow is sitting upright in chair in no acute distress.  He is alert/oriented x 4.  He is calm/cooperative during assessment but is  easily agitated when talking about the whereabouts of his daughter or how he has been treated in the ED.  He is speaking in a clear tone at moderate volume, and normal pace; with good eye contact.  His thought process is coherent and relevant; There is no indication that he is currently responding to internal/external stimuli or experiencing delusional thought content; and he denies suicidal/self-harm/homicidal ideation, psychosis, and paranoia.  Although he is still agitated and fixated on [place] workers and wanting to speak with them.  Patient has remained calm throughout assessment and has answered questions appropriately.  Social work consult ordered to contact [place] worker and arrange conversation between patient and workers.  We will continue to recommend inpatient psychiatric treatment at this time related to his fixation on the workers and aunts daughter.  Possible danger to DSS workers related to them taking his daughter from his home.    Past Psychiatric History: See below  Risk to Self:  Denies Risk to Others:  Denies Prior Inpatient Therapy:  Yes Prior Outpatient Therapy:  Yes  Past Medical History:  Past Medical History:  Diagnosis Date   Bipolar 1 disorder ([place])    Diabetes mellitus, type II ([place])    Hypertension     Past Surgical History:  Procedure Laterality Date   KNEE SURGERY     Family History: History reviewed. No pertinent family history. Family Psychiatric  History: None reported  Social History:  Social History   Substance and Sexual Activity  Alcohol Use Not Currently     Social History   Substance and Sexual Activity  Drug Use Not Currently    Social History   Socioeconomic History   Marital status: Divorced    Spouse name: Not on file   Number of children: Not on file   Years of education: Not on file   Highest education level: Not on file  Occupational History   Not on file  Tobacco Use   Smoking status: Never    Smokeless tobacco: Never  Vaping Use   Vaping Use: Never used  Substance and Sexual Activity   Alcohol use: Not Currently   Drug use: Not Currently   Sexual activity: Not on file  Other Topics Concern   Not on file  Social History Narrative   Not on file   Social Determinants of Health   Financial Resource Strain: Not on file  Food Insecurity: Not on file  Transportation Needs: Not on file  Physical Activity: Not on file  Stress: Not on file  Social Connections: Not on file   Additional Social History:    Allergies:   Allergies  Allergen Reactions   Invega [Paliperidone] Other (See Comments)    "manic episode"   Olanzapine Other (See Comments)    Mania   Topamax [Topiramate] Other (See Comments)    Patient states "glaucoma like symptoms" and blindness    Labs: No results found for this or any previous visit (from the past 48 hour(s)).  Medications:  Current Facility-Administered Medications  Medication Dose Route Frequency Provider Last Rate Last Admin   acetaminophen (TYLENOL) tablet 650 mg  650 mg Oral Q6H PRN Larene Pickett, PA-C   650 mg at 12/19/20 [id number]   amLODipine (NORVASC) tablet 10 mg  10 mg Oral Daily Caccavale, Sophia, PA-C   10 mg at 12/21/20 0939   ARIPiprazole ER ([name] MAINTENA) 400 MG prefilled syringe 400 mg  400 mg Intramuscular Q28 days Eulis Canner E, NP   400 mg at 12/14/20 1030   haloperidol lactate (HALDOL) injection 10 mg  10 mg Intramuscular TID PRN Merlyn Lot E, NP   10 mg at 12/21/20 1020   And   diphenhydrAMINE (BENADRYL) injection 50 mg  50 mg Intramuscular TID PRN Merlyn Lot E, NP   50 mg at 12/21/20 1020   And   LORazepam (ATIVAN) injection 1 mg  1 mg Intramuscular TID PRN Merlyn Lot E, NP   1 mg at 12/21/20 1020   divalproex (DEPAKOTE) DR tablet 250 mg  250 mg Oral Daily Merlyn Lot E, NP   250 mg at 12/21/20 0939   doxazosin (CARDURA) tablet 8 mg  8 mg Oral Daily Caccavale, Sophia, PA-C   8 mg at 12/20/20 1604    gabapentin (NEURONTIN) capsule 300 mg  300 mg Oral TID Merlyn Lot E, NP   300 mg at 12/21/20 0939   hydrochlorothiazide (HYDRODIURIL) tablet 25 mg  25 mg Oral Daily Heloise Purpura, RPH   25 mg at 12/21/20 [id number]   losartan (COZAAR) tablet 100 mg  100 mg Oral Daily Heloise Purpura, RPH   100 mg at 12/21/20 [id number]   Current Outpatient Medications  Medication Sig Dispense Refill   amLODipine (NORVASC) 10 MG tablet Take 1 tablet (10 mg total) by mouth daily. 14 tablet 0   ARIPiprazole ER ([name] MAINTENA) 400 MG SRER injection Inject 2 mLs (400 mg total) into the  muscle every 30 (thirty) days. 1 each 9   BIOTIN PO Take 1 tablet by mouth daily.     doxazosin (CARDURA) 8 MG tablet Take 8 mg by mouth daily.     losartan-hydrochlorothiazide (HYZAAR) 100-25 MG tablet Take 1 tablet by mouth daily.     Multiple Vitamins-Minerals (MULTIVITAMIN WITH MINERALS) tablet Take 1 tablet by mouth daily.     Omega-3 Fatty Acids (FISH OIL OMEGA-3 PO) Take 1 capsule by mouth daily.     VITAMIN E PO Take 1 tablet by mouth daily.      Musculoskeletal: Strength & Muscle Tone: within normal limits Gait & Station: normal Patient leans: N/A  Psychiatric Specialty Exam:  Presentation  General Appearance: Appropriate for Environment  Eye Contact:Good  Speech:Clear and Coherent; Normal Rate  Speech Volume:Normal  Handedness:Right   Mood and Affect  Mood:Anxious; Depressed; Irritable; Angry  Affect:Congruent   Thought Process  Thought Processes:Coherent; Goal Directed  Descriptions of Associations:Intact  Orientation:Full (Time, Place and Person)  Thought Content:WDL; Logical  History of Schizophrenia/Schizoaffective disorder:No data recorded Duration of Psychotic Symptoms:No data recorded Hallucinations:Hallucinations: None  Ideas of Reference:None  Suicidal Thoughts:Suicidal Thoughts: No  Homicidal Thoughts:Homicidal Thoughts: No   Sensorium  Memory:Immediate Good; Recent Good;  Remote Good  Judgment:Fair (Fair but intact)  Insight:Fair; Lacking   Executive Functions  Concentration:Good  Attention Span:Good  [place] of Knowledge:Good  Language:Good   Psychomotor Activity  Psychomotor Activity:Psychomotor Activity: Normal   Assets  Assets:Communication Skills; Desire for Improvement; Housing; Physical Health; Social Support   Sleep  Sleep:Sleep: Good    Physical Exam: Physical Exam Vitals and nursing note reviewed. Exam conducted with a chaperone present.  Constitutional:      General: He is not in acute distress.    Appearance: Normal appearance. He is not ill-appearing.  Cardiovascular:     Rate and Rhythm: Normal rate.  Pulmonary:     Effort: Pulmonary effort is normal.  Neurological:     Mental Status: He is alert and oriented to person, place, and time.  Psychiatric:        Attention and Perception: Attention and perception normal. He does not perceive auditory or visual hallucinations.        Mood and Affect: Mood is anxious and depressed. Affect is angry.        Speech: Speech normal.        Behavior: Behavior normal. Behavior is cooperative.        Thought Content: Thought content normal. Thought content is not paranoid or delusional. Thought content does not include homicidal or suicidal ideation.        Cognition and Memory: Cognition and memory normal.        Judgment: Judgment is impulsive.   Review of Systems  Constitutional: Negative.   HENT: Negative.    Eyes: Negative.   Respiratory: Negative.    Cardiovascular: Negative.   Gastrointestinal: Negative.   Genitourinary: Negative.   Musculoskeletal: Negative.   Skin: Negative.   Neurological: Negative.   Endo/Heme/Allergies: Negative.   Psychiatric/Behavioral:  Positive for depression. Hallucinations: Denies. Suicidal ideas: Denies.The patient is nervous/anxious. Insomnia: Denies.       Patient reporting he is upset related to DSS taking his daughter  away and not talking to him.  States he doesn't know where she is.  States daughter is one 57 old and that he just wants to know where she is.    Blood pressure (!) 144/97, pulse (!) 110, temperature 98.8 F (37.1 C),  temperature source Oral, resp. rate 19, height 5\' 6"  (1.676 m), weight 110.7 kg, SpO2 100 %. Body mass index is 39.39 kg/m.  Treatment Plan Summary: Daily contact with patient to assess and evaluate symptoms and progress in treatment, Medication management, and Plan Inpatient psychiatric treatment   Social work/TOC secure message and consult:  Patient reporting upset because he doesn't know where his one-month-old daughter is, states was never told why DSS removed child from home, while in ED has only been able to make 2 calls a day and wants to know where his daughter is.  Is there anyway social work can get in touch with DSS Berton Lan or Norvel Richards) to see if any one can contact him.  States he wants to know where his daughter is and to talk to someone in DSS.    Disposition: Recommend psychiatric Inpatient admission when medically cleared.  This service was provided via telemedicine using a 2-way, interactive audio and video technology.  Names of all persons participating in this telemedicine service and their role in this encounter. Name: Sandrea Hughs Role: NP  Name: Dr. Assunta Found Role: Psychiatrist  Name: Nelly Rout Role: Patient  Name:  Role:     Everardo Beals, NP 12/21/2020 11:57 AM

## 2020-12-21 NOTE — BH Assessment (Signed)
Care Management - Follow Up [facility] Discharges   Per chart review, pt was discharged from the [facility] on 12-13-20 to follow up with established provider Gretchen Short on 02-16-20.    Patient presented to the [facility] again on 12-17-20 and was then transferred to [facility] due to aggressive behaviors.

## 2020-12-21 NOTE — ED Notes (Signed)
Allowed pt to make last phone call for the day.  We discussed visitation hours.  He was given medicine and a sprite.

## 2020-12-21 NOTE — ED Notes (Signed)
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  I was not aware of this at the time.

## 2020-12-21 NOTE — ED Notes (Signed)
Breakfast orders placed 

## 2020-12-22 DIAGNOSIS — R45850 Homicidal ideations: Secondary | ICD-10-CM

## 2020-12-22 DIAGNOSIS — F319 Bipolar disorder, unspecified: Secondary | ICD-10-CM

## 2020-12-22 MED ORDER — LORAZEPAM 1 MG PO TABS
1.0000 mg | ORAL_TABLET | Freq: Once | ORAL | Status: AC
  Administered 2020-12-22: 10:00:00 1 mg via ORAL
  Filled 2020-12-22: qty 1

## 2020-12-22 MED ORDER — LORAZEPAM 1 MG PO TABS
1.0000 mg | ORAL_TABLET | Freq: Four times a day (QID) | ORAL | Status: DC | PRN
  Administered 2020-12-22: 16:00:00 1 mg via ORAL
  Filled 2020-12-22: qty 1

## 2020-12-22 MED ORDER — DIVALPROEX SODIUM 250 MG PO DR TAB: 250.0000 mg | DELAYED_RELEASE_TABLET | Freq: Every day | ORAL | 0 refills | Status: DC

## 2020-12-22 MED ORDER — LORAZEPAM 1 MG PO TABS
1.0000 mg | ORAL_TABLET | Freq: Once | ORAL | Status: AC
  Administered 2020-12-22: 12:00:00 1 mg via ORAL
  Filled 2020-12-22: qty 1

## 2020-12-22 MED ORDER — GABAPENTIN 300 MG PO CAPS: 300.0000 mg | ORAL_CAPSULE | Freq: Three times a day (TID) | ORAL | 0 refills | Status: DC

## 2020-12-22 NOTE — Consult Note (Signed)
Telepsych Consultation   Reason for Consult:  Psychiatric Reassessment for homicidal ideations Referring Physician:  Haskel SchroederBadalamente, Peter R, PA-C Location of Patient: [facility] Provider[facility]  Patient Identification: Chris BealsBennie Rogers MRN:  [id number] Principal Diagnosis: Adjustment disorder with disturbance of conduct Diagnosis:  Principal Problem:   Adjustment disorder with disturbance of conduct Active Problems:   Bipolar 1 disorder (HCC)   Total Time spent with patient: 30 minutes  Subjective:   Chris Rogers is a 45 y.o. male [facility] with [facility] ideations [facility] workers [employer]enny Bullins and her supervisor, Berneice Heinrichmy Hardin who pick up his one month old daughter.  Patient states, "I'm gonna kill that bitch.".  HPI:  Chris BealsBennie Rogers, 45 y.o., male patient seen for psychiatric reassessment via tele health by this provider, consulted with Dr. Nelly RoutArchana Kumar; and chart reviewed on 12/22/20.  On evaluation Chris Rogers reports he is feeling fine.  Patient states he is ready to go home.  Patient has his brother in law Shari Prowshomas Woods in the room with him during the assessment and states that he would be the one taking patient home tonight.  Reports that patient baseline id fussing and cussing and has tried to tell him the way he talks to people affect the way they act towards him.  Patient family willing to work with him and DSS to get daughter back.  Patient stating he will be staying with his mother tonight but has plans to move in with male friend who also have children and can help with his daughter.  Understands that DSS will have to do investigation.  Patient denies suicidal/self-harm/homicidal ideation, psychosis, and paranoia.  Patient states he will keep his follow up appointments for medication management, therapy, and shot clinic.   During evaluation Chris Rogers is standing in front of machine/camera with his brother in law.  No distress noted.  He is  alert/oriented x 4.  He is calm/cooperative during assessment.  He is speaking in a clear tone at moderate volume, and normal pace; with good eye contact.  His thought process is coherent and relevant; There is no indication that he is currently responding to internal/external stimuli or experiencing delusional thought content; and he denies suicidal/self-harm/homicidal ideation, psychosis, and paranoia.    Past Psychiatric History: See below  Risk to Self:  Denies Risk to Others:  Denies Prior Inpatient Therapy:  Yes Prior Outpatient Therapy:  Yes  Past Medical History:  Past Medical History:  Diagnosis Date   Bipolar 1 disorder (HCC)    Diabetes mellitus, type II (HCC)    Hypertension     Past Surgical History:  Procedure Laterality Date   KNEE SURGERY     Family History: History reviewed. No pertinent family history. Family Psychiatric  History: None reported Social History:  Social History   Substance and Sexual Activity  Alcohol Use Not Currently     Social History   Substance and Sexual Activity  Drug Use Not Currently    Social History   Socioeconomic History   Marital status: Divorced    Spouse name: Not on file   Number of children: Not on file   Years of education: Not on file   Highest education level: Not on file  Occupational History   Not on file  Tobacco Use   Smoking status: Never   Smokeless tobacco: Never  Vaping Use   Vaping Use: Never used  Substance and Sexual Activity   Alcohol use:  Not Currently   Drug use: Not Currently   Sexual activity: Not on file  Other Topics Concern   Not on file  Social History Narrative   Not on file   Social Determinants of Health   Financial Resource Strain: Not on file  Food Insecurity: Not on file  Transportation Needs: Not on file  Physical Activity: Not on file  Stress: Not on file  Social Connections: Not on file   Additional Social History:    Allergies:   Allergies  Allergen Reactions    Invega [Paliperidone] Other (See Comments)    "manic episode"   Olanzapine Other (See Comments)    Mania   Topamax [Topiramate] Other (See Comments)    Patient states "glaucoma like symptoms" and blindness    Labs: No results found for this or any previous visit (from the past 48 hour(s)).  Medications:  Current Facility-Administered Medications  Medication Dose Route Frequency Provider Last Rate Last Admin   acetaminophen (TYLENOL) tablet 650 mg  650 mg Oral Q6H PRN Garlon Hatchet, PA-C   650 mg at 12/22/20 0816   amLODipine (NORVASC) tablet 10 mg  10 mg Oral Daily Caccavale, Sophia, PA-C   10 mg at 12/22/20 0935   ARIPiprazole ER ([name] MAINTENA) 400 MG prefilled syringe 400 mg  400 mg Intramuscular Q28 days Toy Cookey E, NP   400 mg at 12/14/20 1030   haloperidol lactate (HALDOL) injection 10 mg  10 mg Intramuscular TID PRN Chales Abrahams, NP   10 mg at 12/21/20 1020   And   diphenhydrAMINE (BENADRYL) injection 50 mg  50 mg Intramuscular TID PRN Chales Abrahams, NP   50 mg at 12/21/20 1020   And   LORazepam (ATIVAN) injection 1 mg  1 mg Intramuscular TID PRN Chales Abrahams, NP   1 mg at 12/21/20 1020   divalproex (DEPAKOTE) DR tablet 250 mg  250 mg Oral Daily Ophelia Shoulder E, NP   250 mg at 12/22/20 0934   doxazosin (CARDURA) tablet 8 mg  8 mg Oral Daily Caccavale, Sophia, PA-C   8 mg at 12/22/20 1127   gabapentin (NEURONTIN) capsule 300 mg  300 mg Oral TID Chales Abrahams, NP   300 mg at 12/22/20 1611   hydrochlorothiazide (HYDRODIURIL) tablet 25 mg  25 mg Oral Daily Cathie Hoops, RPH   25 mg at 12/22/20 0934   LORazepam (ATIVAN) tablet 1 mg  1 mg Oral Q6H PRN Jacalyn Lefevre, MD   1 mg at 12/22/20 1611   losartan (COZAAR) tablet 100 mg  100 mg Oral Daily Cathie Hoops, RPH   100 mg at 12/22/20 5102   Current Outpatient Medications  Medication Sig Dispense Refill   amLODipine (NORVASC) 10 MG tablet Take 1 tablet (10 mg total) by mouth daily. 14 tablet 0    ARIPiprazole ER ([name] MAINTENA) 400 MG SRER injection Inject 2 mLs (400 mg total) into the muscle every 30 (thirty) days. 1 each 9   BIOTIN PO Take 1 tablet by mouth daily.     doxazosin (CARDURA) 8 MG tablet Take 8 mg by mouth daily.     losartan-hydrochlorothiazide (HYZAAR) 100-25 MG tablet Take 1 tablet by mouth daily.     Multiple Vitamins-Minerals (MULTIVITAMIN WITH MINERALS) tablet Take 1 tablet by mouth daily.     Omega-3 Fatty Acids (FISH OIL OMEGA-3 PO) Take 1 capsule by mouth daily.     VITAMIN E PO Take 1 tablet by mouth daily.  Musculoskeletal: Strength & Muscle Tone: within normal limits Gait & Station: normal Patient leans: N/A  Psychiatric Specialty Exam:  Presentation  General Appearance: Appropriate for Environment  Eye Contact:Good  Speech:Clear and Coherent; Normal Rate  Speech Volume:Normal  Handedness:Right   Mood and Affect  Mood:Euthymic  Affect:Congruent   Thought Process  Thought Processes:Coherent; Goal Directed  Descriptions of Associations:Intact  Orientation:Full (Time, Place and Person)  Thought Content:WDL  History of Schizophrenia/Schizoaffective disorder:No data recorded Duration of Psychotic Symptoms:No data recorded Hallucinations:Hallucinations: None  Ideas of Reference:None  Suicidal Thoughts:Suicidal Thoughts: No  Homicidal Thoughts:Homicidal Thoughts: No   Sensorium  Memory:Immediate Good; Recent Good; Remote Good  Judgment:Intact  Insight:Present   Executive Functions  Concentration:Good  Attention Span:Good  [place] of Knowledge:Good  Language:Good   Psychomotor Activity  Psychomotor Activity:Psychomotor Activity: Normal   Assets  Assets:Communication Skills; Desire for Improvement; Housing; Physical Health; Resilience; Social Support   Sleep  Sleep:Sleep: Good    Physical Exam: Physical Exam Vitals and nursing note reviewed. Exam conducted with a chaperone present.   Constitutional:      General: He is not in acute distress.    Appearance: Normal appearance. He is not ill-appearing.  Cardiovascular:     Rate and Rhythm: Normal rate.  Pulmonary:     Effort: Pulmonary effort is normal.  Neurological:     Mental Status: He is alert and oriented to person, place, and time.  Psychiatric:        Attention and Perception: Attention and perception normal. He does not perceive auditory or visual hallucinations.        Mood and Affect: Mood and affect normal.        Speech: Speech normal.        Behavior: Behavior normal. Behavior is cooperative.        Thought Content: Thought content normal. Thought content is not paranoid or delusional. Thought content does not include homicidal or suicidal ideation.        Cognition and Memory: Cognition and memory normal.        Judgment: Judgment is impulsive.   Review of Systems  Constitutional: Negative.   HENT: Negative.    Eyes: Negative.   Respiratory: Negative.    Cardiovascular: Negative.   Gastrointestinal: Negative.   Genitourinary: Negative.   Musculoskeletal: Negative.   Skin: Negative.   Neurological: Negative.   Endo/Heme/Allergies: Negative.   Psychiatric/Behavioral:  Depression: Stable. Hallucinations: Denies. Suicidal ideas: Denies. The patient is nervous/anxious. Insomnia: Denies.       Patient reporting he is upset related to DSS taking his daughter away and not talking to him.  States he doesn't know where she is.  States daughter is one 47 old and that he just wants to know where she is.    Blood pressure 137/89, pulse 90, temperature 97.9 F (36.6 C), temperature source Oral, resp. rate 16, height 5\' 6"  (1.676 m), weight 110.7 kg, SpO2 98 %. Body mass index is 39.39 kg/m.  Treatment Plan Summary: Psychiatrically cleared to follow up with current outpatient psychiatric provider   Disposition: No evidence of imminent risk to self or others at present.   Patient does not meet criteria  for psychiatric inpatient admission. Supportive therapy provided about ongoing stressors. Refer to IOP. Discussed crisis plan, support from social network, calling 911, coming to the Emergency Department, and calling Suicide Hotline.  This service was provided via telemedicine using a 2-way, interactive audio and video technology.  Names of all persons participating in this  telemedicine service and their role in this encounter. Name: Assunta Found Role: NP  Name: Dr. Nelly Rout Role: Psychiatrist  Name: Chris Rogers Role: Patient  Name: Shari Prows Role: Brother in law    Secure message sent to patients nurse Jarome Lamas, RN informing:  Psychiatric reassessment complete.  Patient psychiatrically cleared with safety plan and follow up outpatient psychiatric services that have been listed on AVS (discharge instructions.).  Patient will need 30 day prescriptions for Depakote DR 250 mg daily, Gabapentin 300 mg Tid.  May also need prescriptions for his medical medications.  Collateral and safety plan established with his brother in law that participated in assessment.  Inform MD only default listed.  Gretchen Short, NP and [employer], LCSW added to secure message.   Julianah Marciel, NP 12/22/2020 6:23 PM

## 2020-12-22 NOTE — ED Notes (Addendum)
Pt being reassessed at this time. Pt visitor at bedside per NP request.

## 2020-12-22 NOTE — ED Notes (Signed)
RN attempted to call [facility] concerning next steps for this pt. [facility] not currently answering phone. RN to continue to monitor.

## 2020-12-22 NOTE — ED Notes (Signed)
IVC rescinded per Dr. Particia Nearing

## 2020-12-22 NOTE — ED Notes (Signed)
Communication with Tiburcio Bash regarding pt's status for [facility]. Per admissions, pt bed rejected r/t to aggressive behavior. RN to touch base with [facility] this AM for further follow up.

## 2020-12-22 NOTE — Discharge Instructions (Signed)
Mobile Crisis Response Teams Listed by counties in vicinity of [facility] providers [facility] [phone] [facility] [phone] [facility] [phone] [facility] [phone] [place]                * [facility] [phone]                * [facility] [phone]  [facility] [phone] [facility] [employer]  [phone] * [facility] [phone]     Safety Plan Josten [place]uth will reach out to his brother in law, mother, friend, call 911 or call mobile crisis, or go to nearest emergency room if condition worsens or if suicidal thoughts become active Patients' will follow up with [facility] for outpatient psychiatric services (therapy/medication management).  Appointments listed on Discharge instructions  The suicide prevention education provided includes the following: Suicide risk factors Suicide prevention and interventions National Suicide Hotline telephone number [facility] assessment telephone number [facility] Emergency Assistance 911 [facility] and/or Residential Mobile Crisis Unit telephone number Request made of family/significant other to:  Patient to share with family Remove weapons (e.g., guns, rifles, knives), all items previously/currently identified as safety concern.   Remove drugs/medications (over the counter, prescriptions, illicit drugs), all items previously/currently identified as a safety concern.

## 2020-12-22 NOTE — ED Notes (Addendum)
Pt showered, vitals taken, sandwich and fluids given. Pt calm.

## 2020-12-22 NOTE — ED Notes (Signed)
Pt pacing and reports feeling , "keyed up". Pt reports anxiousness. Pt redirectable at this time. PA Blue notified. RN to give 1 mg ativan PO.

## 2020-12-22 NOTE — ED Notes (Signed)
This RN called [facility] to make them aware about pt's [facility] placement being rejected. Per Dannielle Huh at [facility], the team will reassess pt's current status.

## 2020-12-22 NOTE — ED Notes (Signed)
Patient verbalizes understanding of discharge instructions. Prescriptions and follow-up care reviewed. Opportunity for questioning and answers were provided. Armband removed by staff, pt discharged from ED ambulatory.  

## 2020-12-22 NOTE — ED Notes (Signed)
Rank[place], NP secured chat about an update for the pt plan of care. Pt is requesting to be reassessed again.

## 2020-12-22 NOTE — ED Provider Notes (Signed)
Pt has been cleared by psych.  Family will come get him.  Depakote and Neurontin filled.  Pt said he has his bp meds.  IVC rescinded.  Pt is stable for d/c.  Return if worse.   Jacalyn Lefevre, MD 12/22/20 [phone]

## 2020-12-22 NOTE — ED Notes (Signed)
Pt to nurses station that he is starting to feel anxious again. Pt requesting PO ativan. MD made aware. See new orders.

## 2020-12-22 NOTE — ED Notes (Signed)
Pt belongings returned from locker and security. Pt signed appropriate paperwork.

## 2020-12-22 NOTE — ED Notes (Addendum)
Spoke with Tiburcio Bash, Intake coordinator at [facility] from approx. [phone].  He approved Chris Rogers for a bed.   [facility] Serita Grammes anytime after 9am Report to [phone] Accepting Provider: Dr. Ellamae Sia  Go first to [facility] for Covid Screening, then to [facility] for intake.

## 2020-12-22 NOTE — ED Notes (Signed)
Pt continues to pace and be loud. Pt is redirectable and non-threatening.  Pt anxiety unrelieved by ativan. MD lawson made aware. RN to give additional dose of PO ativan.

## 2020-12-24 ENCOUNTER — Ambulatory Visit (INDEPENDENT_AMBULATORY_CARE_PROVIDER_SITE_OTHER): Payer: Medicare Other | Admitting: Psychiatry

## 2020-12-24 ENCOUNTER — Encounter (HOSPITAL_COMMUNITY): Payer: Self-pay | Admitting: Psychiatry

## 2020-12-24 ENCOUNTER — Other Ambulatory Visit: Payer: Self-pay

## 2020-12-24 DIAGNOSIS — F3130 Bipolar disorder, current episode depressed, mild or moderate severity, unspecified: Secondary | ICD-10-CM | POA: Diagnosis not present

## 2020-12-24 MED ORDER — GABAPENTIN 400 MG PO CAPS: 400.0000 mg | ORAL_CAPSULE | Freq: Three times a day (TID) | ORAL | 3 refills | Status: DC

## 2020-12-24 MED ORDER — ARIPIPRAZOLE ER 400 MG IM SRER: 400.0000 mg | INTRAMUSCULAR | 9 refills | Status: DC

## 2020-12-24 MED ORDER — DIVALPROEX SODIUM 250 MG PO DR TAB
250.0000 mg | DELAYED_RELEASE_TABLET | Freq: Every day | ORAL | 0 refills | Status: DC
Start: 2020-12-24 — End: 2021-01-07

## 2020-12-24 MED ORDER — AMITRIPTYLINE HCL 50 MG PO TABS: 50.0000 mg | ORAL_TABLET | Freq: Every day | ORAL | 3 refills | Status: DC

## 2020-12-24 NOTE — Progress Notes (Signed)
BH MD/PA/NP OP Progress Note    12/24/2020 11:15 AM Chris Rogers  MRN:  [id number]  Chief Complaint: "I went crazy when they took my baby and I still can't sleep"  HPI: 45 year old male seen today for follow up psychiatric evaluation. He has a psychiatric history of bipolar affective disorder, bipolar 1, and depression managed on mirtazapine 45 mg daily, and [name] 400 mg q. 28 days. Recently he was hospitalized at [facility] and started on  Depakote 250 daily and Gabapentin 300 mg three times daily. He notes that his medications are somewhat effective in managing his psychotic conditions.  Today he is pleasant, cooperative, engaged in conversation and maintained eye contact.  Patient hyper-verbal during exam. He notes that he went crazy when his child was taken recently after he was having a manic break. He notes that the mother of his child reported his to DSS and his child was taken. He notes that his thoughts ruminate on her child and getting her back. He reports that he feels imprisoned since his baby has been gone. He also notes that his sleep continues to decline. He notes that Mirtazapine has been ineffective noting that he sleeps 4 or less hours. He notes that amitriptyline was more effective in the past and request to switch antidepressants.    Patient notes that the above increases his anxiety and depression. Provider conducted a GAD 7 and patient scored an 18, at his last visit he scored a 1. Provider also conducted a PHQ 9 and patient scored a 12, at his last visit he scored 1. 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 At times he reports that he is distractible at times and has increased irritability. He also notes that his mother is a source of his stress noting that she is controling. Lately he reports that he has been more verbally  aggressive to her noting that he curses at her. He also notes that he has been more angry at the social workers who took his child but denies wanting to harm them. He denies other symptoms of mania. He denies SI/HI/VAH or paranoia.  He endorses adequate sleep and appetite.    Today he is agreeable to increase Gabapentin 300 mg three times daily to 400 mg three times daily to help manage anxiety, sleep, and mood. He was instructed to cut mirtazapine in half for a week and then discontinue it. Provider recommend Ambien to help manage sleep but patient notes that he did not want anything addicting. He was agreeable to starting Amitriptyline 50 mg nightly to help manage sleep, anxiety, and depression. Provider discussed placing patient on low dose of Zyprexa if mood and sleep does not improve. He endorsed understanding and agreed. No other concerns noted at this time.     Visit Diagnosis:    ICD-10-CM   1. Bipolar I disorder, most recent episode depressed (HCC)  F31.30 divalproex (DEPAKOTE) 250 MG DR tablet    gabapentin (NEURONTIN) 400 MG capsule    ARIPiprazole ER ([name] MAINTENA) 400 MG SRER injection    amitriptyline (ELAVIL) 50 MG tablet       Past Psychiatric History: Bipolar affective, MDD, Bipolar 1  Past Medical History:  Past Medical History:  Diagnosis Date   Bipolar 1 disorder (HCC)    Diabetes mellitus, type II (HCC)  Hypertension     Past Surgical History:  Procedure Laterality Date   KNEE SURGERY      Family Psychiatric History: Father bipolar, sister depression, brother schizophrenia  Family History: No family history on file.  Social History:  Social History   Socioeconomic History   Marital status: Divorced    Spouse name: Not on file   Number of children: Not on file   Years of education: Not on file   Highest education level: Not on file  Occupational History   Not on file  Tobacco Use   Smoking status: Never   Smokeless tobacco: Never  Vaping Use    Vaping Use: Never used  Substance and Sexual Activity   Alcohol use: Not Currently   Drug use: Not Currently   Sexual activity: Not on file  Other Topics Concern   Not on file  Social History Narrative   Not on file   Social Determinants of Health   Financial Resource Strain: Not on file  Food Insecurity: Not on file  Transportation Needs: Not on file  Physical Activity: Not on file  Stress: Not on file  Social Connections: Not on file    Allergies:  Allergies  Allergen Reactions   Invega [Paliperidone] Other (See Comments)    "manic episode"   Olanzapine Other (See Comments)    Mania   Topamax [Topiramate] Other (See Comments)    Patient states "glaucoma like symptoms" and blindness    Metabolic Disorder Labs: Lab Results  Component Value Date   HGBA1C 6.5 (H) 06/22/2019   MPG 139.85 06/22/2019   No results found for: PROLACTIN Lab Results  Component Value Date   CHOL 175 06/22/2019   TRIG 56 06/22/2019   HDL 50 06/22/2019   CHOLHDL 3.5 06/22/2019   VLDL 11 06/22/2019   LDLCALC 114 (H) 06/22/2019   Lab Results  Component Value Date   TSH 2.897 06/22/2019    Therapeutic Level Labs: No results found for: LITHIUM No results found for: VALPROATE No components found for:  CBMZ  Current Medications: Current Outpatient Medications  Medication Sig Dispense Refill   amitriptyline (ELAVIL) 50 MG tablet Take 1 tablet (50 mg total) by mouth at bedtime. 30 tablet 3   amLODipine (NORVASC) 10 MG tablet Take 1 tablet (10 mg total) by mouth daily. 14 tablet 0   ARIPiprazole ER ([name] MAINTENA) 400 MG SRER injection Inject 2 mLs (400 mg total) into the muscle every 30 (thirty) days. 1 each 9   BIOTIN PO Take 1 tablet by mouth daily.     divalproex (DEPAKOTE) 250 MG DR tablet Take 1 tablet (250 mg total) by mouth daily. 30 tablet 0   doxazosin (CARDURA) 8 MG tablet Take 8 mg by mouth daily.     gabapentin (NEURONTIN) 400 MG capsule Take 1 capsule (400 mg total) by  mouth 3 (three) times daily. 90 capsule 3   losartan-hydrochlorothiazide (HYZAAR) 100-25 MG tablet Take 1 tablet by mouth daily.     Multiple Vitamins-Minerals (MULTIVITAMIN WITH MINERALS) tablet Take 1 tablet by mouth daily.     Omega-3 Fatty Acids (FISH OIL OMEGA-3 PO) Take 1 capsule by mouth daily.     VITAMIN E PO Take 1 tablet by mouth daily.     Current Facility-Administered Medications  Medication Dose Route Frequency Provider Last Rate Last Admin   ARIPiprazole ER ([name] MAINTENA) 400 MG prefilled syringe 400 mg  400 mg Intramuscular Q28 days Toy Cookey E, NP   400 mg at  12/14/20 1030     Musculoskeletal: Strength & Muscle Tone: within normal limits Gait & Station: normal Patient leans: N/A  Psychiatric Specialty Exam: Review of Systems  There were no vitals taken for this visit.There is no height or weight on file to calculate BMI.  General Appearance: Well Groomed  Eye Contact:  Good  Speech:  Clear and Coherent and Normal Rate  Volume:  Normal  Mood:  Anxious, Depressed, and Irritable  Affect:  Appropriate and Congruent  Thought Process:  Coherent, Goal Directed and Linear  Orientation:  Full (Time, Place, and Person)  Thought Content: WDL and Logical   Suicidal Thoughts:  No  Homicidal Thoughts:  No, reports angry at social workers but denies wanting to harm them  Memory:  Immediate;   Good Recent;   Good Remote;   Good  Judgement:  Good  Insight:  Good  Psychomotor Activity:  Normal  Concentration:  Concentration: Good and Attention Span: Good  Recall:  Good  Fund of Knowledge: Good  Language: Good  Akathisia:  No  Handed:  Right  AIMS (if indicated): Not done  Assets:  Communication Skills Desire for Improvement Financial Resources/Insurance Housing Social Support  ADL's:  Intact  Cognition: WNL  Sleep:  Poor   Screenings: GAD-7    Flowsheet Row Clinical Support from 12/24/2020 in [facility] Clinical Support  from 11/11/2020 in [facility] Clinical Support from 05/20/2020 in [facility] Video Visit from 01/23/2020 in [facility] Clinical Support from 10/24/2019 in [facility]  Total GAD-7 Score 18 1 2 6 7       PHQ2-9    Flowsheet Row Clinical Support from 12/24/2020 in [facility] Clinical Support from 11[facility]11/2020 in [facility] Clinical Support from 05/20/2020 in [facility] Clinical Support from 03/18/2020 in [facility] Video Visit from 01/23/2020 in [facility]  PHQ-2 Total Score 3 1 2  0 2  PHQ-9 Total Score 12 1 5  0 11      Flowsheet Row Clinical Support from 12/24/2020 in [facility] ED from 12/17/2020 in [facility] EMERGENCY DEPARTMENT Clinical Support from 05/20/2020 in [facility]  C-SSRS RISK CATEGORY No Risk No Risk No Risk        Assessment and Plan: Today patient endorses symptoms of anxiety, depression, hypomania, and insomnia. Today he is agreeable to increase Gabapentin 300 mg three times daily to 400 mg three times daily to help manage anxiety, sleep, and mood. He was instructed to cut mirtazapine in half for a week and then discontinue it. Provider recommend Ambien to help manage sleep but patient notes that he did not want anything addicting. He was agreeable to starting Amitriptyline 50 mg nightly to help manage sleep, anxiety, and depression. Provider discussed placing patient on low dose of Zyprexa if mood and sleep does not improve.  1. Bipolar I disorder, most recent episode depressed (HCC)  Continue- divalproex (DEPAKOTE) 250 MG DR tablet; Take 1 tablet (250 mg total) by mouth daily.  Dispense: 30 tablet; Refill: 0 Increased- gabapentin (NEURONTIN)  400 MG capsule; Take 1 capsule (400 mg total) by mouth 3 (three) times daily.  Dispense: 90 capsule; Refill: 3 Continue- ARIPiprazole ER ([name] MAINTENA) 400 MG SRER injection; Inject 2 mLs (400 mg total) into the muscle every 30 (thirty) days.  Dispense: 1 each; Refill: 9 Start- amitriptyline (ELAVIL) 50 MG tablet; Take 1 tablet (50 mg total) by mouth at bedtime.  Dispense: 30 tablet; Refill: 3        Follow-up in 2 month for medication management Follow-up in 1 month for [name] injection Shanna Cisco, NP 12/24/2020, 11:15 AM

## 2020-12-31 ENCOUNTER — Telehealth (HOSPITAL_COMMUNITY): Payer: Self-pay | Admitting: *Deleted

## 2020-12-31 NOTE — Telephone Encounter (Signed)
Patient was scheduled for 01/08/2020. Provider will discuss concerns at that time. Patient is to follow instructions of ED staff until then. Per chart review ED reccommedations as followed "recommended that you stay active and moving throughout the day, wear compression stockings, and keep your feet elevated when sitting as well as when you sleep. There is a possibility that your swelling is caused by your recent medication changes, if conservative methods do not yield any improvement in your swelling, you will need to follow-up with your primary care provider and discuss possible medication changes".

## 2020-12-31 NOTE — Telephone Encounter (Signed)
Call from patient requesting an appt or call to Dr Doyne Keel. He went to the ED in [facility] yesterday for edema. The ED thinks his Depakote may be causing it. He would like Lyric, front desk staff to call him with an appt. Will also let Dr Doyne Keel know.

## 2020-12-31 NOTE — Telephone Encounter (Signed)
Coordinated with Dr Doyne Keel a walk in appt which is scheduled for 1/5 at 8 am. Called him back to give him the date and time but unable to leave a message, VM not set up. Will try again later.

## 2021-01-07 ENCOUNTER — Encounter (HOSPITAL_COMMUNITY): Payer: Self-pay | Admitting: Psychiatry

## 2021-01-07 ENCOUNTER — Other Ambulatory Visit: Payer: Self-pay

## 2021-01-07 ENCOUNTER — Ambulatory Visit (INDEPENDENT_AMBULATORY_CARE_PROVIDER_SITE_OTHER): Payer: Medicare Other | Admitting: Psychiatry

## 2021-01-07 DIAGNOSIS — F3130 Bipolar disorder, current episode depressed, mild or moderate severity, unspecified: Secondary | ICD-10-CM | POA: Diagnosis not present

## 2021-01-07 MED ORDER — LAMOTRIGINE 25 MG PO TABS: 25.0000 mg | ORAL_TABLET | Freq: Every day | ORAL | 1 refills | Status: DC

## 2021-01-07 MED ORDER — AMITRIPTYLINE HCL 100 MG PO TABS: 100.0000 mg | ORAL_TABLET | Freq: Every day | ORAL | 3 refills | Status: DC

## 2021-01-07 NOTE — Progress Notes (Signed)
BH MD/PA/NP OP Progress Note    01/07/2021 10:28 AM Chris Rogers  MRN:  [id number]  Chief Complaint: "I stop Depakote because I was told that it because my swelling" Chief Complaint   Medication Management    HPI: 46 year old male seen today for follow up psychiatric evaluation. He has a psychiatric history of bipolar affective disorder, bipolar 1, and depression managed on amitriptyline 50 mg mg daily, [name] 400 mg q. 28 days,  Depakote 250 daily and Gabapentin 400 mg three times daily. He notes that his medications are effective in managing his psychotic conditions.  Today he is pleasant, cooperative, engaged in conversation and maintained eye contact.  Today his speech is clear and he is not hyper-verbal as he was during his last exam. He notes that recently he was hospitalized due to swelling in his legs.  He informed Probation officer that he was informed that Depakote could be the culprit and reports that he discontinued it. Since discontinuing Depakote, he notes that he has been wearing compression stocking and elevating his leg to reduce swelling.  Patient notes that since his last visit he is less irritable, distractible, however reports that he has racing thoughts (going back to school, creating apps, getting married, and getting daughter back).  Patient notes the source of his anxiety is worrying about his daughter who continues to be in [place] custody.  He notes that he attended court yesterday which he notes went well.  He informed Probation officer that he continues to be compliant with his medication and notes that he walks into the clinic when he feels mentally unstable.  Provider conducted a GAD-7 and PHQ a 7, at his last visit he scored 108.  Provider also conducted PHQ-9 and patient scored an 8, at his last visit he scored a 12.  He notes that his sleep is been poor (sleeping 3 to 4 hours).  He endorses having adequate appetite.  Today he denies SI/HI/VAH or paranoia.    Patient asked provider if she can  write a letter regarding his compliance and follow-up since November.  Provider was agreeable to this request.    Today patient is agreeable to increasing amitriptyline 50 mg to help manage sleep, anxiety, depression.  Patient notes that he prefers not to be on Zyprexa to stabilize his mood noting that it sent him into mania when he was on before.  He also notes that he found Seroquel and Haldol effective.  He did note that he finds Lamictal effective and requests to be restarted on Lamictal.  Patient will start Lamictal 25 mg for 2 weeks, he will then increase to 50 mg for 2 weeks, and then 75 mg.  He will follow-up with provider on February 04, 2021 for further medication adjustment.  Patient recently had refills on gabapentin so this was not filled today.  No other concerns at this time.     Visit Diagnosis:    ICD-10-CM   1. Bipolar I disorder, most recent episode depressed (HCC)  F31.30 amitriptyline (ELAVIL) 100 MG tablet    lamoTRIgine (LAMICTAL) 25 MG tablet       Past Psychiatric History: Bipolar affective, MDD, Bipolar 1  Past Medical History:  Past Medical History:  Diagnosis Date   Bipolar 1 disorder ([place])    Diabetes mellitus, type II ([place])    Hypertension     Past Surgical History:  Procedure Laterality Date   KNEE SURGERY      Family Psychiatric History: Father bipolar, sister depression,  brother schizophrenia  Family History: No family history on file.  Social History:  Social History   Socioeconomic History   Marital status: Divorced    Spouse name: Not on file   Number of children: Not on file   Years of education: Not on file   Highest education level: Not on file  Occupational History   Not on file  Tobacco Use   Smoking status: Never   Smokeless tobacco: Never  Vaping Use   Vaping Use: Never used  Substance and Sexual Activity   Alcohol use: Not Currently   Drug use: Not Currently   Sexual activity: Not on file  Other Topics Concern   Not on  file  Social History Narrative   Not on file   Social Determinants of Health   Financial Resource Strain: Not on file  Food Insecurity: Not on file  Transportation Needs: Not on file  Physical Activity: Not on file  Stress: Not on file  Social Connections: Not on file    Allergies:  Allergies  Allergen Reactions   Invega [Paliperidone] Other (See Comments)    "manic episode"   Olanzapine Other (See Comments)    Mania   Topamax [Topiramate] Other (See Comments)    Patient states "glaucoma like symptoms" and blindness    Metabolic Disorder Labs: Lab Results  Component Value Date   HGBA1C 6.5 (H) 06/22/2019   MPG 139.85 06/22/2019   No results found for: PROLACTIN Lab Results  Component Value Date   CHOL 175 06/22/2019   TRIG 56 06/22/2019   HDL 50 06/22/2019   CHOLHDL 3.5 06/22/2019   VLDL 11 06/22/2019   LDLCALC 114 (H) 06/22/2019   Lab Results  Component Value Date   TSH 2.897 06/22/2019    Therapeutic Level Labs: No results found for: LITHIUM No results found for: VALPROATE No components found for:  CBMZ  Current Medications: Current Outpatient Medications  Medication Sig Dispense Refill   lamoTRIgine (LAMICTAL) 25 MG tablet Take 1 tablet (25 mg total) by mouth daily. Take 25 mg (1 tablet ) for 2 weeks, increase to 50 mg(2 tablet) 2 weeks after, and then 75 mg (3 tablets). 90 tablet 1   amitriptyline (ELAVIL) 100 MG tablet Take 1 tablet (100 mg total) by mouth at bedtime. 30 tablet 3   amLODipine (NORVASC) 10 MG tablet Take 1 tablet (10 mg total) by mouth daily. 14 tablet 0   ARIPiprazole ER ([name] MAINTENA) 400 MG SRER injection Inject 2 mLs (400 mg total) into the muscle every 30 (thirty) days. 1 each 9   BIOTIN PO Take 1 tablet by mouth daily.     doxazosin (CARDURA) 8 MG tablet Take 8 mg by mouth daily.     gabapentin (NEURONTIN) 400 MG capsule Take 1 capsule (400 mg total) by mouth 3 (three) times daily. 90 capsule 3   losartan-hydrochlorothiazide  (HYZAAR) 100-25 MG tablet Take 1 tablet by mouth daily.     Multiple Vitamins-Minerals (MULTIVITAMIN WITH MINERALS) tablet Take 1 tablet by mouth daily.     Omega-3 Fatty Acids (FISH OIL OMEGA-3 PO) Take 1 capsule by mouth daily.     VITAMIN E PO Take 1 tablet by mouth daily.     Current Facility-Administered Medications  Medication Dose Route Frequency Provider Last Rate Last Admin   ARIPiprazole ER ([name] MAINTENA) 400 MG prefilled syringe 400 mg  400 mg Intramuscular Q28 days Eulis Canner E, NP   400 mg at 12/14/20 1030  Musculoskeletal: Strength & Muscle Tone: within normal limits Gait & Station: normal Patient leans: N/A  Psychiatric Specialty Exam: Review of Systems  Blood pressure (!) 155/111, pulse (!) 112, height 5\' 6"  (1.676 m), weight 253 lb (114.8 kg), SpO2 97 %.Body mass index is 40.84 kg/m.  General Appearance: Well Groomed  Eye Contact:  Good  Speech:  Clear and Coherent and Normal Rate  Volume:  Normal  Mood:  Euthymic  Affect:  Appropriate and Congruent  Thought Process:  Coherent, Goal Directed and Linear  Orientation:  Full (Time, Place, and Person)  Thought Content: WDL and Logical   Suicidal Thoughts:  No  Homicidal Thoughts:  No,   Memory:  Immediate;   Good Recent;   Good Remote;   Good  Judgement:  Good  Insight:  Good  Psychomotor Activity:  Normal  Concentration:  Concentration: Good and Attention Span: Good  Recall:  Good  Fund of Knowledge: Good  Language: Good  Akathisia:  No  Handed:  Right  AIMS (if indicated): Not done  Assets:  Communication Skills Desire for Improvement Financial Resources/Insurance Housing Social Support  ADL's:  Intact  Cognition: WNL  Sleep:  Poor   Screenings: GAD-7    Flowsheet Row Clinical Support from 01/07/2021 in [facility] Clinical Support from 12/24/2020 in [facility] Clinical Support from 11/11/2020 in [facility] Clinical Support from 05/20/2020 in [facility] Video Visit from 01[facility]23/2020 in [facility]  Total GAD-7 Score 7 18 1 2 6       PHQ2-9    Flowsheet Row Clinical Support from 01/07/2021 in [facility] Clinical Support from 12[facility]24/2020 in [facility] Clinical Support from 11/11/2020 in [place] from 05/20/2020 in [place] from 03/18/2020 in [facility]  PHQ-2 Total Score 0 3 1 2  0  PHQ-9 Total Score 8 12 1 5  0      Flowsheet Row Clinical Support from 01/07/2021 in [place] from 12/24/2020 in [facility] ED from 12/17/2020 in [place] No Risk No Risk No Risk        Assessment and Plan: Today patient endorses symptoms of insomnia.  He notes that his hypomania has improved however still endorses symptoms racing thoughts and mild irritability.  Patient discontinued Depakote due to potential side effect of swelling.  Today patient is agreeable to increasing amitriptyline 50 mg to help manage sleep, anxiety, depression.  Patient notes that he prefers not to be on Zyprexa to stabilize his mood noting that it sent him into mania when he was on before.  He also notes that he found Seroquel and Haldol effective.  He did note that he finds Lamictal effective and requests to be restarted on Lamictal.  Patient will start Lamictal 25 mg for 2 weeks, he will then increase to 50 mg for 2 weeks, and then 75 mg.  He will follow-up with provider on February 04, 2021 for further medication adjustment.  Patient recently had refills on gabapentin so this was not filled today.  .  1. Bipolar I disorder, most recent episode depressed  (HCC)  Increase- amitriptyline (ELAVIL) 100 MG tablet; Take 1 tablet (100 mg total) by mouth at bedtime.  Dispense: 30 tablet; Refill: 3 Start-  lamoTRIgine (LAMICTAL) 25 MG tablet; Take 1 tablet (25 mg total) by mouth daily. Take 25 mg (1 tablet ) for 2 weeks, increase to 50 mg(2 tablet) 2 weeks after, and then 75 mg (3 tablets).  Dispense: 90 tablet; Refill: 1       Follow-up in 1 month for medication management Follow-up in 1 month for [name] injection Salley Slaughter, NP 01/07/2021, 10:28 AM

## 2021-01-12 ENCOUNTER — Ambulatory Visit (HOSPITAL_COMMUNITY): Payer: Medicare Other | Admitting: *Deleted

## 2021-01-12 ENCOUNTER — Other Ambulatory Visit: Payer: Self-pay

## 2021-01-12 ENCOUNTER — Encounter (HOSPITAL_COMMUNITY): Payer: Self-pay

## 2021-01-12 DIAGNOSIS — F3130 Bipolar disorder, current episode depressed, mild or moderate severity, unspecified: Secondary | ICD-10-CM

## 2021-01-12 NOTE — Progress Notes (Signed)
In an hour late for his appt his am, but made it. He has his DNA testing for paternity test in [facility] today and is nervous. He states the results wont change anything for him but he said the attorney he has wont represent him if he is not the biological father. He has a good personal appearance, seems calm, and stable but says he recently lost his car keys. Encouraged to call or come in as needs to. Offered support. He has a return appt with Dr Ronne Binning on 2/4. He was just seen on 01/07/21 so I did not redo his vitals. He got his [name] M 400 mg injection in his L DELTOID today without issue. To return in one month for his next shot.

## 2021-01-13 ENCOUNTER — Other Ambulatory Visit (HOSPITAL_COMMUNITY): Payer: Self-pay | Admitting: Psychiatry

## 2021-01-13 DIAGNOSIS — F3130 Bipolar disorder, current episode depressed, mild or moderate severity, unspecified: Secondary | ICD-10-CM

## 2021-01-27 ENCOUNTER — Telehealth (HOSPITAL_COMMUNITY): Payer: Self-pay | Admitting: *Deleted

## 2021-01-27 NOTE — Telephone Encounter (Signed)
Called and left a vm for writer and when I called him back he was in the lobby here. He found out today he is not the babys father and he is devastated. Offered him a lot of support and suggestions to care for himself so he doesn't have a resurgence of his illness. He states he has limited supports and considers [place] his family and is grateful for [place] and our support. He demonstrates a lot of insight. Discouraged him from having contact with the babies mom as she is negative for him, stresses him and this is her 6 th child she doesn't have custody of. Reminded of his next appt with his provider, his therapist and his next shot. Invited to come back as he needs to. BP today 149/111 pulse is 103. He states he is taking his meds, owes money at his PCPs office so they wont see him till he pays. States he is med compliant.

## 2021-01-28 ENCOUNTER — Other Ambulatory Visit: Payer: Self-pay

## 2021-01-28 ENCOUNTER — Encounter (HOSPITAL_COMMUNITY): Payer: Self-pay

## 2021-01-28 ENCOUNTER — Emergency Department (HOSPITAL_COMMUNITY): Payer: Medicare Other

## 2021-01-28 ENCOUNTER — Emergency Department (HOSPITAL_COMMUNITY)
Admission: EM | Admit: 2021-01-28 | Discharge: 2021-01-28 | Disposition: A | Discharge status: Home / Self Care | Payer: Medicare Other | Attending: Emergency Medicine | Admitting: Emergency Medicine

## 2021-01-28 DIAGNOSIS — I10 Essential (primary) hypertension: Secondary | ICD-10-CM | POA: Diagnosis present

## 2021-01-28 DIAGNOSIS — Z5321 Procedure and treatment not carried out due to patient leaving prior to being seen by health care provider: Secondary | ICD-10-CM | POA: Diagnosis not present

## 2021-01-28 DIAGNOSIS — R519 Headache, unspecified: Secondary | ICD-10-CM | POA: Diagnosis not present

## 2021-01-28 LAB — TROPONIN I (HIGH SENSITIVITY)
Troponin I (High Sensitivity): 3 ng/L (ref <18)
Troponin I (High Sensitivity): 4 ng/L (ref <18)

## 2021-01-28 LAB — CBC
HCT: 42.3 % (ref 39.0–52.0)
Hemoglobin: 13.9 g/dL (ref 13.0–17.0)
MCH: 26.4 pg (ref 26.0–34.0)
MCHC: 32.9 g/dL (ref 30.0–36.0)
MCV: 80.3 fL (ref 80.0–100.0)
Platelets: 235 10*3/uL (ref 150–400)
RBC: 5.27 MIL/uL (ref 4.22–5.81)
RDW: 13.9 % (ref 11.5–15.5)
WBC: 7.8 10*3/uL (ref 4.0–10.5)
nRBC: 0 % (ref 0.0–0.2)

## 2021-01-28 LAB — BASIC METABOLIC PANEL
Anion gap: 7 (ref 5–15)
BUN: 13 mg/dL (ref 6–20)
CO2: 26 mmol/L (ref 22–32)
Calcium: 9.2 mg/dL (ref 8.9–10.3)
Chloride: 102 mmol/L (ref 98–111)
Creatinine, Ser: 1.14 mg/dL (ref 0.61–1.24)
GFR, Estimated: >60 mL/min (ref >60)
Glucose, Bld: 105 mg/dL — ABNORMAL HIGH (ref 70–99)
Potassium: 3.9 mmol/L (ref 3.5–5.1)
Sodium: 135 mmol/L (ref 135–145)

## 2021-01-28 NOTE — ED Notes (Signed)
Pt stated that he is leaving due to wait time and has to go home.

## 2021-01-28 NOTE — ED Triage Notes (Addendum)
Pt arrived via POV for HTN. Pt states he took his bp at home and it was 190/160 at home. Pt states has a HA. Pt states he has been up since 0100 this morning, because he couldn't sleep. Pt is a little hypertensive in triage.

## 2021-01-28 NOTE — ED Provider Triage Note (Signed)
Emergency Medicine Provider Triage Evaluation Note  Cordae Mccarey , a 46 y.o. male  was evaluated in triage.  Pt complains of hypertension onset prior to arrival.  Patient notes that he took his blood pressure at home and it was 190/160 at home.  He has associated headache.  Has not tried any medications for his symptoms at home. Denies chest pain, shortness of breath, fever, chills, abdominal pain.  Denies history of MI, stroke, coronary artery disease.  He takes losartan, hydrochlorothiazide, doxazosin.   Review of Systems  Positive: As per HPI above. Negative: Chest pain, shortness of breath  Physical Exam  BP (!) 155/110    Pulse (!) 110    Temp 98.5 F (36.9 C) (Oral)    Resp 18    Ht 5\' 6"  (1.676 m)    Wt 114.8 kg    SpO2 100%    BMI 40.85 kg/m  Gen:   Awake, no distress   Resp:  Normal effort  MSK:   Moves extremities without difficulty  Other:  No chest wall tenderness to palpation.  Medical Decision Making  Medically screening exam initiated at 4:01 PM.  Appropriate orders placed.  Paco Goetting was informed that the remainder of the evaluation will be completed by another provider, this initial triage assessment does not replace that evaluation, and the importance of remaining in the ED until their evaluation is complete.   Thijs Brunton A, PA-C 01/28/21 1608

## 2021-01-29 ENCOUNTER — Emergency Department (HOSPITAL_COMMUNITY)
Admission: EM | Admit: 2021-01-29 | Discharge: 2021-01-29 | Disposition: A | Discharge status: Home / Self Care | Payer: Medicare Other | Attending: Emergency Medicine | Admitting: Emergency Medicine

## 2021-01-29 ENCOUNTER — Other Ambulatory Visit: Payer: Self-pay

## 2021-01-29 ENCOUNTER — Encounter (HOSPITAL_COMMUNITY): Payer: Self-pay | Admitting: *Deleted

## 2021-01-29 DIAGNOSIS — Z5321 Procedure and treatment not carried out due to patient leaving prior to being seen by health care provider: Secondary | ICD-10-CM | POA: Diagnosis not present

## 2021-01-29 DIAGNOSIS — R030 Elevated blood-pressure reading, without diagnosis of hypertension: Secondary | ICD-10-CM | POA: Insufficient documentation

## 2021-01-29 DIAGNOSIS — R519 Headache, unspecified: Secondary | ICD-10-CM | POA: Insufficient documentation

## 2021-01-29 NOTE — ED Notes (Signed)
Pt left said wait was too long Pt was encouraged to stay

## 2021-01-29 NOTE — ED Triage Notes (Signed)
Pt states he has a headache, took 2 tylenol prior to arrival, headache has subsided. Pt says he was in the ED earlier for high blood pressure and left prior to full evaluation.

## 2021-02-02 ENCOUNTER — Ambulatory Visit (HOSPITAL_COMMUNITY): Payer: Medicare Other | Admitting: Licensed Clinical Social Worker

## 2021-02-02 ENCOUNTER — Ambulatory Visit (INDEPENDENT_AMBULATORY_CARE_PROVIDER_SITE_OTHER): Payer: Medicare Other | Admitting: Clinical

## 2021-02-02 ENCOUNTER — Other Ambulatory Visit: Payer: Self-pay

## 2021-02-02 ENCOUNTER — Ambulatory Visit (HOSPITAL_COMMUNITY): Payer: Medicare Other | Admitting: Clinical

## 2021-02-02 DIAGNOSIS — E669 Obesity, unspecified: Secondary | ICD-10-CM | POA: Diagnosis not present

## 2021-02-02 DIAGNOSIS — Y9289 Other specified places as the place of occurrence of the external cause: Secondary | ICD-10-CM | POA: Insufficient documentation

## 2021-02-02 DIAGNOSIS — M79641 Pain in right hand: Secondary | ICD-10-CM | POA: Insufficient documentation

## 2021-02-02 DIAGNOSIS — S61412A Laceration without foreign body of left hand, initial encounter: Secondary | ICD-10-CM | POA: Diagnosis not present

## 2021-02-02 DIAGNOSIS — F3130 Bipolar disorder, current episode depressed, mild or moderate severity, unspecified: Secondary | ICD-10-CM | POA: Diagnosis not present

## 2021-02-02 DIAGNOSIS — I10 Essential (primary) hypertension: Secondary | ICD-10-CM | POA: Diagnosis not present

## 2021-02-02 DIAGNOSIS — R4587 Impulsiveness: Secondary | ICD-10-CM | POA: Insufficient documentation

## 2021-02-02 DIAGNOSIS — F319 Bipolar disorder, unspecified: Secondary | ICD-10-CM | POA: Diagnosis not present

## 2021-02-02 DIAGNOSIS — X58XXXA Exposure to other specified factors, initial encounter: Secondary | ICD-10-CM | POA: Diagnosis not present

## 2021-02-02 DIAGNOSIS — Z5321 Procedure and treatment not carried out due to patient leaving prior to being seen by health care provider: Secondary | ICD-10-CM | POA: Insufficient documentation

## 2021-02-02 DIAGNOSIS — L853 Xerosis cutis: Secondary | ICD-10-CM | POA: Diagnosis not present

## 2021-02-02 DIAGNOSIS — L818 Other specified disorders of pigmentation: Secondary | ICD-10-CM | POA: Insufficient documentation

## 2021-02-02 DIAGNOSIS — Z5982 Transportation insecurity: Secondary | ICD-10-CM | POA: Diagnosis not present

## 2021-02-03 ENCOUNTER — Encounter (HOSPITAL_COMMUNITY): Payer: Self-pay | Admitting: Registered Nurse

## 2021-02-03 ENCOUNTER — Encounter (HOSPITAL_COMMUNITY): Payer: Self-pay | Admitting: Student

## 2021-02-03 ENCOUNTER — Ambulatory Visit (INDEPENDENT_AMBULATORY_CARE_PROVIDER_SITE_OTHER)
Admission: EM | Admit: 2021-02-03 | Discharge: 2021-02-03 | Disposition: A | Discharge status: Home / Self Care | Payer: Medicare Other | Source: Home / Self Care

## 2021-02-03 ENCOUNTER — Emergency Department (HOSPITAL_COMMUNITY)
Admission: EM | Admit: 2021-02-03 | Discharge: 2021-02-03 | Disposition: A | Discharge status: Home / Self Care | Payer: Medicare Other | Attending: Physician Assistant | Admitting: Physician Assistant

## 2021-02-03 DIAGNOSIS — M79641 Pain in right hand: Secondary | ICD-10-CM | POA: Insufficient documentation

## 2021-02-03 DIAGNOSIS — M7989 Other specified soft tissue disorders: Secondary | ICD-10-CM | POA: Insufficient documentation

## 2021-02-03 DIAGNOSIS — I10 Essential (primary) hypertension: Secondary | ICD-10-CM | POA: Insufficient documentation

## 2021-02-03 DIAGNOSIS — X58XXXA Exposure to other specified factors, initial encounter: Secondary | ICD-10-CM | POA: Insufficient documentation

## 2021-02-03 DIAGNOSIS — Y9289 Other specified places as the place of occurrence of the external cause: Secondary | ICD-10-CM | POA: Insufficient documentation

## 2021-02-03 DIAGNOSIS — Y9389 Activity, other specified: Secondary | ICD-10-CM | POA: Insufficient documentation

## 2021-02-03 DIAGNOSIS — Z5982 Transportation insecurity: Secondary | ICD-10-CM | POA: Insufficient documentation

## 2021-02-03 DIAGNOSIS — S61412A Laceration without foreign body of left hand, initial encounter: Secondary | ICD-10-CM | POA: Insufficient documentation

## 2021-02-03 DIAGNOSIS — F319 Bipolar disorder, unspecified: Secondary | ICD-10-CM | POA: Insufficient documentation

## 2021-02-03 DIAGNOSIS — R4587 Impulsiveness: Secondary | ICD-10-CM | POA: Insufficient documentation

## 2021-02-03 DIAGNOSIS — F3110 Bipolar disorder, current episode manic without psychotic features, unspecified: Secondary | ICD-10-CM | POA: Diagnosis present

## 2021-02-03 DIAGNOSIS — E669 Obesity, unspecified: Secondary | ICD-10-CM | POA: Insufficient documentation

## 2021-02-03 DIAGNOSIS — L818 Other specified disorders of pigmentation: Secondary | ICD-10-CM | POA: Insufficient documentation

## 2021-02-03 DIAGNOSIS — F3111 Bipolar disorder, current episode manic without psychotic features, mild: Secondary | ICD-10-CM | POA: Diagnosis not present

## 2021-02-03 DIAGNOSIS — L853 Xerosis cutis: Secondary | ICD-10-CM | POA: Insufficient documentation

## 2021-02-03 MED ORDER — TETANUS-DIPHTH-ACELL PERTUSSIS 5-2.5-18.5 LF-MCG/0.5 IM SUSY
0.5000 mL | PREFILLED_SYRINGE | Freq: Once | INTRAMUSCULAR | Status: DC
Start: 2021-02-03 — End: 2021-02-03

## 2021-02-03 NOTE — ED Provider Notes (Signed)
Behavioral Health Urgent Care Medical Screening Exam  Patient Name: Chris Rogers MRN: [id number] Date of Evaluation: 02/03/21 Chief Complaint:   Diagnosis:  Final diagnoses:  Bipolar affective disorder, currently manic, mild ([place])    History of Present illness: Chris Rogers is a 46 y.o. male patient presented to [facility] as a walk in via police with complaints of wanting something to eat and something to drink.  Also requesting to get his long-acting injectable early.  Chris Rogers, 45 y.o., male patient seen face to face by this provider, consulted with Dr. Ernie Hew; and chart reviewed on 02/03/21.  On evaluation Chris Rogers reports patient reports he called the police to pick him up because he did not have any transportation to bring him to urgent care.  Patient is asking if he can have his long-acting injectable early and if he could have a sandwich and something to drink.  Patient denies suicidal/homicidal ideations, psychosis, paranoia.  Patient then asked if he can spend the night.  Patient informed that he could not spend the night that he needed to go home.  Also advised to stop calling the police as his personal transportation.  Patient informed of his upcoming appointments for 02/04/2021 at 1:30 PM with Burt Ek and on 02/09/2021 at 8 AM for his long-acting injectable patient is aware of appointments.  Patient then asked police if they would take him home because he was not allowed to ride the bus anymore today.  [place] [employer], structural agreed to take patient home. During evaluation Chris Rogers is standing going through his personal items in a bag he has with him.  There is no noted acute distress.  He is alert/oriented x 4; calm/cooperative, and pleasant; and mood congruent with affect.  He is speaking in a clear tone at moderate volume, and normal pace; with good eye contact.  Her thought process is coherent and relevant; There is no indication that he is currently responding to  internal/external stimuli or experiencing delusional thought content; and he has denied suicidal/self-harm/homicidal ideation, psychosis, and paranoia.   Patient has remained calm throughout assessment and has answered questions appropriately.     Psychiatric Specialty Exam  Presentation  General Appearance:Appropriate for Environment; Disheveled  Eye Contact:Good  Speech:Clear and Coherent; Normal Rate  Speech Volume:Increased  Handedness:Right   Mood and Affect  Mood:Euthymic  Affect:Congruent   Thought Process  Thought Processes:Coherent; Goal Directed  Descriptions of Associations:Intact  Orientation:Full (Time, Place and Person)  Thought Content:Logical    Hallucinations:None  Ideas of Reference:None  Suicidal Thoughts:No  Homicidal Thoughts:No With Intent   Sensorium  Memory:Immediate Good; Recent Good; Remote Good  Judgment:Fair  Insight:Present   Executive Functions  Concentration:Good  Attention Span:Good  Chris Rogers  Language:Good   Psychomotor Activity  Psychomotor Activity:Normal   Assets  Assets:Communication Skills; Desire for Improvement; Financial Resources/Insurance; Housing; Leisure Time; Social Support   Sleep  Sleep:Fair  Number of hours: 5   Nutritional Assessment (For OBS and FBC admissions only) Has the patient had a weight loss or gain of 10 pounds or more in the last 3 months?: No Has the patient had a decrease in food intake/or appetite?: No Does the patient have dental problems?: No Does the patient have eating habits or behaviors that may be indicators of an eating disorder including binging or inducing vomiting?: No Has the patient recently lost weight without trying?: 0 Has the patient been eating poorly because of a decreased appetite?: 0 Malnutrition Screening Tool  Score: 0    Physical Exam: Physical Exam Vitals reviewed.  Constitutional:      General: He is not in acute  distress.    Appearance: He is obese. He is not ill-appearing, toxic-appearing or diaphoretic.  Eyes:     General:        Right eye: No discharge.        Left eye: No discharge.  Cardiovascular:     Rate and Rhythm: Normal rate.  Pulmonary:     Effort: Pulmonary effort is normal. No respiratory distress.  Musculoskeletal:     Cervical back: Normal range of motion.     Comments: Slight diffuse swelling noted of dorsum of patient's right hand and patient's right hand digits.  Patient endorses pain over the dorsal area of his right hand.  Patient has full active range of motion and 5 out of 5 strength of flexion/extension and abduction/adduction of all digits of the right hand and patient does endorse mild tenderness during these movements.  No swelling noted of patient's left hand were left hand digits. Patient has full active range of motion and 5 out of 5 strength of flexion/extension and abduction/abduction of all digits of the left hand and patient denies pain during these movements.  Patient has full active range of motion and 5 out of 5 strength of bilateral wrist flexion/extension and bilateral arm/forearm flexion/extension.  No additional lesions noted of patient's upper extremities.  Skin:    General: Skin is warm and dry.     Comments: Scattered areas of hypopigmentation noted of patient's bilateral knuckles.  Neurological:     General: No focal deficit present.     Mental Status: He is alert and oriented to person, place, and time.     Comments: No tremor noted.   Psychiatric:        Attention and Perception: Attention normal. He does not perceive auditory or visual hallucinations.        Mood and Affect: Mood and affect normal.        Speech: Speech normal.        Behavior: Behavior is not agitated, slowed, aggressive, withdrawn, hyperactive or combative. Behavior is cooperative.        Thought Content: Thought content is not paranoid or delusional. Thought content does not include  homicidal or suicidal ideation.        Judgment: Judgment is impulsive.   Review of Systems  Constitutional: Negative.   Eyes: Negative.   Respiratory: Negative.    Cardiovascular: Negative.   Gastrointestinal: Negative.   Genitourinary: Negative.   Skin:        Dry skin, hyperpigmentation   Psychiatric/Behavioral:  Negative for depression, hallucinations and suicidal ideas. The patient is not nervous/anxious.   Blood pressure (!) 182/112, pulse 91, temperature 98.8 F (37.1 C), temperature source Oral, resp. rate 19, SpO2 98 %. There is no height or weight on file to calculate BMI.  Musculoskeletal: Strength & Muscle Tone: within normal limits Gait & Station: normal Patient leans: N/A   [facility] MSE Discharge Disposition for Follow up and Recommendations: Based on my evaluation the patient appears to have an emergency medical condition for which I recommend the patient be transferred to the emergency department for further evaluation.    [place]. Go on 02/04/2021.   Specialty: Urgent Care Why: Appointment with Clearence Ped at 1:30 pm Contact information: [place] [phone]  Go to  [facility].   Specialty: Urgent Care Why: Appointment at 8:00 am for long acting injectable (shot clinic) Contact information: [place], NP 02/03/2021, 1:38 PM

## 2021-02-03 NOTE — BH Assessment (Signed)
Pt called [employer] for a ride here so that he could get his medications. "I just need to see my people upstairs for my meds". Pt denies SI, reports HI towards police, then towards two "Mexicans standing on corner". Pt starts statements and then stops to recant statement. Pt denies plan or intent. Pt w/pressured speech and tangential. Pt released from [facility] today  Pt is routine.

## 2021-02-03 NOTE — Discharge Instructions (Addendum)
Attend your next appointments with Dr. Doyne Keel (02/04/2021 at 1:30 PM) and for your next [name] injection (02/09/2021 at 8:00 AM).  Discharge recommendations:  Patient is to take medications as prescribed. Please see information for follow-up appointment with psychiatry and therapy. Please follow up with your primary care provider for all medical related needs.   Therapy: We recommend that patient participate in individual therapy to address mental health concerns.  Medications: The patient is to contact a medical professional and/or outpatient provider to address any new side effects that develop. Patient should update outpatient providers of any new medications and/or medication changes.   Atypical antipsychotics: If you are prescribed an atypical antipsychotic, it is recommended that your height, weight, BMI, blood pressure, fasting lipid panel, and fasting blood sugar be monitored by your outpatient providers.  Safety:  The patient should abstain from use of illicit substances/drugs and abuse of any medications. If symptoms worsen or do not continue to improve or if the patient becomes actively suicidal or homicidal then it is recommended that the patient return to the closest hospital emergency department, the [facility], or call 911 for further evaluation and treatment. National Suicide Prevention Lifeline 1-800-SUICIDE or 331-196-8448.  About 988 988 offers 24/7 access to trained crisis counselors who can help people experiencing mental health-related distress. People can call or text 988 or chat 988lifeline.org for themselves or if they are worried about a loved one who may need crisis support.

## 2021-02-03 NOTE — ED Notes (Signed)
Patient states "thank you so much God bless im leaving." When asking the patient if he still wanted to be seen he stated "na im gone you guys fixed it thank  you so much."

## 2021-02-03 NOTE — ED Notes (Signed)
Pt refusing vitals, stating "I feel so much better, I don't need those." Pt ambulatory to the lobby
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
## 2021-02-04 ENCOUNTER — Encounter (HOSPITAL_COMMUNITY): Payer: Self-pay | Admitting: Psychiatry

## 2021-02-04 ENCOUNTER — Other Ambulatory Visit: Payer: Self-pay

## 2021-02-04 ENCOUNTER — Other Ambulatory Visit (HOSPITAL_COMMUNITY): Payer: Self-pay | Admitting: Psychiatry

## 2021-02-04 ENCOUNTER — Ambulatory Visit (INDEPENDENT_AMBULATORY_CARE_PROVIDER_SITE_OTHER): Payer: Medicare Other | Admitting: Psychiatry

## 2021-02-04 DIAGNOSIS — F3130 Bipolar disorder, current episode depressed, mild or moderate severity, unspecified: Secondary | ICD-10-CM

## 2021-02-04 MED ORDER — AMITRIPTYLINE HCL 100 MG PO TABS: 100.0000 mg | ORAL_TABLET | Freq: Every day | ORAL | 3 refills | Status: DC

## 2021-02-04 MED ORDER — GABAPENTIN 400 MG PO CAPS: 400.0000 mg | ORAL_CAPSULE | Freq: Three times a day (TID) | ORAL | 3 refills | Status: DC

## 2021-02-04 MED ORDER — ARIPIPRAZOLE ER 400 MG IM SRER: 400.0000 mg | INTRAMUSCULAR | 9 refills | Status: DC
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
## 2021-02-06 NOTE — Plan of Care (Signed)
Client was scheduled for next appointment. ?

## 2021-02-06 NOTE — Progress Notes (Signed)
Comprehensive Clinical Assessment (CCA) Note  02/02/2021 Chris Rogers [id number]  Chief Complaint:  Chief Complaint  Patient presents with   Depression   Visit Diagnosis:  Bipolar 1 disorder, most recent episode depressed   Interpretive summary:  Client is a 46 year old male presenting to the [facility] for outpatient services.  Client presents as currently engaged with a [facility] psychiatrist for the treatment of bipolar disorder.  Client reported his symptoms include racing thoughts, mood swings, feelings of helplessness, and "I get mad and start talking crazy".  Client reported his symptoms have made it difficult for him to maintain friendships and date.  Client reported on today feeling depressed and upset about his recent experience with online dating.  Client had difficulty staying on topic with answering prompted questions by therapist due to his preoccupation about insulin Relationship with another woman.  Client reported he has spent approximately $20,000 on online dating app with the morning using speaking with.  Client stated "I wish on the symptoms I does not to be left breast someone, I am tired of hurting so bad".  Client reported his current depressive symptoms are not correlated with suicidal ideations.  Client reported he is currently living with his mother but does not have a relationship with other family members due to his mental health.  Client reported his brother-in-law is supportive of him and he plans on reaching out to him to have someone to talk to and possibly travel to see.  Client denied illicit substances. Client presented oriented x5, appropriately dressed, and friendly.  Client denied hallucinations, delusions, suicidal and homicidal ideations.  Client was screened for pain, nutrition, and [place] suicide severity.  Treatment recommendations are continued psychiatric evaluation and medication management and individual therapy as  needed.Therapist  provided information on format of appointment (virtual or face to face).   The client was advised to call back or seek an in-person evaluation if the symptoms worsen or if the condition fails to improve as anticipated before the next scheduled appointment. Client was in agreement with treatment recommendations.   CCA Biopsychosocial Intake/Chief Complaint:  Client presents for a clinical assessment.  Client is currently engaged with [facility] outpatient psychiatry for medication management of her diagnosis of bipolar disorder.  Current Symptoms/Problems: Client reported depressed mood, racing thoughts, mood swings  Patient Reported Schizophrenia/Schizoaffective Diagnosis in Past: No  Type of Services Patient Feels are Needed: Psychiatry with medication management and therapy as needed   Initial Clinical Notes/Concerns: No data recorded  Mental Health Symptoms Depression:   Change in energy/activity; Difficulty Concentrating; Hopelessness   Duration of Depressive symptoms:  Greater than two weeks   Mania:   Racing thoughts   Anxiety:    Worrying   Psychosis:   None   Duration of Psychotic symptoms: No data recorded  Trauma:   None   Obsessions:   None   Compulsions:   None   Inattention:   None   Hyperactivity/Impulsivity:   None   Oppositional/Defiant Behaviors:   None   Emotional Irregularity:   None   Other Mood/Personality Symptoms:  No data recorded   Mental Status Exam Appearance and self-care  Stature:   Average   Weight:   Average weight   Clothing:   Casual   Grooming:   Normal   Cosmetic use:   Age appropriate   Posture/gait:   Normal   Motor activity:   Not Remarkable   Sensorium  Attention:   Normal  Concentration:   Preoccupied   Orientation:   X5   Recall/memory:   Normal   Affect and Mood  Affect:   Depressed   Mood:   Depressed   Relating  Eye contact:   Normal   Facial expression:    Depressed   Attitude toward examiner:   Cooperative   Thought and Language  Speech flow:  Clear and Coherent   Thought content:   Appropriate to Mood and Circumstances   Preoccupation:   None   Hallucinations:   None   Organization:  No data recorded  [employer] of Knowledge:   Fair   Intelligence:   Average   Abstraction:   Normal   Judgement:   Impaired   Reality Testing:   Adequate   Insight:   Fair   Decision Making:   Only simple   Social Functioning  Social Maturity:   Isolates   Social Judgement:   Naive   Stress  Stressors:   Relationship   Coping Ability:   Optician, dispensing Deficits:   Self-control   Supports:   Family     Religion: Religion/Spirituality Are You A Religious Person?: No  Leisure/Recreation: Leisure / Recreation Do You Have Hobbies?: No  Exercise/Diet: Exercise/Diet Do You Exercise?: No Have You Gained or Lost A Significant Amount of Weight in the Past Six Months?: No Do You Follow a Special Diet?: No Do You Have Any Trouble Sleeping?: Yes   CCA Employment/Education Employment/Work Situation: Employment / Work Situation Employment Situation: On disability Why is Patient on Disability: Mental Health  Education: Education Is Patient Currently Attending School?: No   CCA Family/Childhood History Family and Relationship History: Family history Marital status: Single Does patient have children?: No  Childhood History:  Childhood History Additional childhood history information: UTA  Child/Adolescent Assessment:     CCA Substance Use Alcohol/Drug Use: Alcohol / Drug Use History of alcohol / drug use?: No history of alcohol / drug abuse                         ASAM's:  Six Dimensions of Multidimensional Assessment  Dimension 1:  Acute Intoxication and/or Withdrawal Potential:      Dimension 2:  Biomedical Conditions and Complications:      Dimension 3:   Emotional, Behavioral, or Cognitive Conditions and Complications:     Dimension 4:  Readiness to Change:     Dimension 5:  Relapse, Continued use, or Continued Problem Potential:     Dimension 6:  Recovery/Living Environment:     ASAM Severity Score:    ASAM Recommended Level of Treatment:     Substance use Disorder (SUD)    Recommendations for Services/Supports/Treatments: Recommendations for Services/Supports/Treatments Recommendations For Services/Supports/Treatments: Individual Therapy, Medication Management  DSM5 Diagnoses: Patient Active Problem List   Diagnosis Date Noted   Bipolar affective disorder ([place]) 02/03/2021   Homicidal ideation    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   Bipolar 1 disorder ([place]) 12/19/2020   Bipolar I disorder, most recent episode (or current) manic ([place]) 02/12/2020   Major depressive disorder, recurrent episode, moderate (HCC)    Bipolar I disorder, most recent episode depressed ([place])    Affective psychosis, bipolar ([place])     Patient Centered Plan: Patient is on the following Treatment Plan(s):  Depression   Referrals to Alternative Service(s): Referred to Alternative Service(s):   Place:   Date:   Time:    Referred  to Alternative Service(s):   Place:   Date:   Time:    Referred to Alternative Service(s):   Place:   Date:   Time:    Referred to Alternative Service(s):   Place:   Date:   Time:     Bernestine Amass, LCSW

## 2021-02-08 ENCOUNTER — Encounter (HOSPITAL_COMMUNITY): Payer: Self-pay

## 2021-02-08 ENCOUNTER — Ambulatory Visit (INDEPENDENT_AMBULATORY_CARE_PROVIDER_SITE_OTHER): Payer: Medicare Other | Admitting: *Deleted

## 2021-02-08 VITALS — BP 142/107 | HR 92 | Ht 66.0 in | Wt 250.0 lb

## 2021-02-08 DIAGNOSIS — F3130 Bipolar disorder, current episode depressed, mild or moderate severity, unspecified: Secondary | ICD-10-CM

## 2021-02-08 NOTE — Progress Notes (Signed)
Patient arrived for  [name] 400 mg Injection Tolerated injection well in Right-Arm. Patient came in a day early for injection due to he spent 24 hr in jail after former girlfriend Called police & stated he was trespassing. Patient is highly exciteable  & slightly manic.

## 2021-02-09 ENCOUNTER — Ambulatory Visit (HOSPITAL_COMMUNITY): Payer: Medicare Other

## 2021-02-09 ENCOUNTER — Telehealth (INDEPENDENT_AMBULATORY_CARE_PROVIDER_SITE_OTHER): Payer: Medicare Other | Admitting: Psychiatry

## 2021-02-09 ENCOUNTER — Encounter (HOSPITAL_COMMUNITY): Payer: Self-pay | Admitting: Psychiatry

## 2021-02-09 DIAGNOSIS — F3130 Bipolar disorder, current episode depressed, mild or moderate severity, unspecified: Secondary | ICD-10-CM

## 2021-02-09 MED ORDER — AMITRIPTYLINE HCL 100 MG PO TABS: 100.0000 mg | ORAL_TABLET | Freq: Every day | ORAL | 3 refills | Status: DC

## 2021-02-09 NOTE — Progress Notes (Signed)
BH MD/PA/NP OP Progress Note  Virtual Visit via Telephone Note  I connected with Chris Rogers on 02/09/21 at 10:00 AM EST by telephone and verified that I am speaking with the correct person using two identifiers.  Location: Patient: home Provider: Clinic   I discussed the limitations, risks, security and privacy concerns of performing an evaluation and management service by telephone and the availability of in person appointments. I also discussed with the patient that there may be a patient responsible charge related to this service. The patient expressed understanding and agreed to proceed.   I provided 30 minutes of non-face-to-face time during this encounter.   02/09/2021 10:32 AM Chris Rogers  MRN:  [id number]  Chief Complaint: "I got locke up and I am not sleep"   HPI: 46 year old male seen today for follow up psychiatric evaluation. He has a psychiatric history of bipolar affective disorder, bipolar 1, and depression managed on amitriptyline 100 mg, [name] 400 mg q. 28 days, and Gabapentin 400 mg three times daily. He notes that he has not picked up amitriptyline and request a refill.     Today he was unable to login virtually so his assessment was done over the phone.  Patient informed Clinical research associate that he has been feeling more anxious and depressed because he was locked up by his girlfriend.  He informed Clinical research associate that their relationship is chaotic however notes that he wants to be with her as he feels she needs his support in caring for her 5 children.  Notes that he is currently on his way to support her in court.  He informed Clinical research associate that his sleep has been poor and requested to restart his amitriptyline.  Provider was agreeable to this request.  Today he denies SI/HI/VH, mania, or paranoia.      At this time amitriptyline 100 mg nightly refilled to help manage anxiety, depression, and sleep.  He will continue other medications as prescribed.  No other concerns at this  time.       Visit Diagnosis:    ICD-10-CM   1. Bipolar I disorder, most recent episode depressed (HCC)  F31.30 amitriptyline (ELAVIL) 100 MG tablet       Past Psychiatric History: Bipolar affective, MDD, Bipolar 1  Past Medical History:  Past Medical History:  Diagnosis Date   Bipolar 1 disorder (HCC)    Diabetes mellitus, type II (HCC)    Hypertension     Past Surgical History:  Procedure Laterality Date   KNEE SURGERY      Family Psychiatric History: Father bipolar, sister depression, brother schizophrenia  Family History: No family history on file.  Social History:  Social History   Socioeconomic History   Marital status: Divorced    Spouse name: Not on file   Number of children: Not on file   Years of education: Not on file   Highest education level: Not on file  Occupational History   Not on file  Tobacco Use   Smoking status: Never   Smokeless tobacco: Never  Vaping Use   Vaping Use: Never used  Substance and Sexual Activity   Alcohol use: Not Currently   Drug use: Not Currently   Sexual activity: Not on file  Other Topics Concern   Not on file  Social History Narrative   Not on file   Social Determinants of Health   Financial Resource Strain: Not on file  Food Insecurity: Not on file  Transportation Needs: Not on file  Physical Activity:  Not on file  Stress: Not on file  Social Connections: Not on file    Allergies:  Allergies  Allergen Reactions   Invega [Paliperidone] Other (See Comments)    "manic episode"   Olanzapine Other (See Comments)    Mania   Topamax [Topiramate] Other (See Comments)    Patient states "glaucoma like symptoms" and blindness    Metabolic Disorder Labs: Lab Results  Component Value Date   HGBA1C 6.5 (H) 06/22/2019   MPG 139.85 06/22/2019   No results found for: PROLACTIN Lab Results  Component Value Date   CHOL 175 06/22/2019   TRIG 56 06/22/2019   HDL 50 06/22/2019   CHOLHDL 3.5 06/22/2019    VLDL 11 06/22/2019   LDLCALC 114 (H) 06/22/2019   Lab Results  Component Value Date   TSH 2.897 06/22/2019    Therapeutic Level Labs: No results found for: LITHIUM No results found for: VALPROATE No components found for:  CBMZ  Current Medications: Current Outpatient Medications  Medication Sig Dispense Refill   amitriptyline (ELAVIL) 100 MG tablet Take 1 tablet (100 mg total) by mouth at bedtime. 30 tablet 3   amLODipine (NORVASC) 10 MG tablet Take 1 tablet (10 mg total) by mouth daily. 14 tablet 0   ARIPiprazole ER ([name] MAINTENA) 400 MG SRER injection Inject 2 mLs (400 mg total) into the muscle every 30 (thirty) days. 1 each 9   BIOTIN PO Take 1 tablet by mouth daily.     doxazosin (CARDURA) 8 MG tablet Take 8 mg by mouth daily.     gabapentin (NEURONTIN) 400 MG capsule Take 1 capsule (400 mg total) by mouth 3 (three) times daily. 90 capsule 3   losartan-hydrochlorothiazide (HYZAAR) 100-25 MG tablet Take 1 tablet by mouth daily.     Multiple Vitamins-Minerals (MULTIVITAMIN WITH MINERALS) tablet Take 1 tablet by mouth daily.     Omega-3 Fatty Acids (FISH OIL OMEGA-3 PO) Take 1 capsule by mouth daily.     VITAMIN E PO Take 1 tablet by mouth daily.     Current Facility-Administered Medications  Medication Dose Route Frequency Provider Last Rate Last Admin   ARIPiprazole ER ([name] MAINTENA) 400 MG prefilled syringe 400 mg  400 mg Intramuscular Q28 days Toy Cookey E, NP   400 mg at 02/08/21 [id number]     Musculoskeletal: Strength & Muscle Tone:  Unable to assess due to telephone visit Gait & Station:  Unable to assess due to telephone visit Patient leans: N/A  Psychiatric Specialty Exam: Review of Systems  There were no vitals taken for this visit.There is no height or weight on file to calculate BMI.  General Appearance:  Unable to assess due to telephone visit  Eye Contact:   Unable to assess due to telephone visit  Speech:  Clear and Coherent and Normal Rate   Volume:  Normal  Mood:  Anxious  Affect:   Unable to assess due to telephone visit  Thought Process:  Coherent, Goal Directed and Linear  Orientation:  Full (Time, Place, and Person)  Thought Content: WDL and Logical   Suicidal Thoughts:  No  Homicidal Thoughts:  No,   Memory:  Immediate;   Good Recent;   Good Remote;   Good  Judgement:  Good  Insight:  Good  Psychomotor Activity:   Unable to assess due to telephone visit  Concentration:  Concentration: Good and Attention Span: Good  Recall:  Good  Fund of Knowledge: Good  Language: Good  Akathisia:   Unable  to assess due to telephone visit  Handed:  Right  AIMS (if indicated): Not done  Assets:  Communication Skills Desire for Improvement Financial Resources/Insurance Housing Social Support  ADL's:  Intact  Cognition: WNL  Sleep:  Poor   Screenings: GAD-7    Flowsheet Row Clinical Support from [facility]/07/2021 in [facility] Clinical Support from 12/24/2020 in [facility] Clinical Support from 11/11/2020 in [facility] Clinical Support from 05/20/2020 in [facility] Video Visit from 01/23/2020 in [facility]  Total GAD-7 Score 7 18 1 2 6       PHQ2-9    Flowsheet Row Clinical Support from 01/07/2021 in [facility] Clinical Support from 12/24/2020 in [facility] Clinical Support from 11/11/2020 in [facility] Clinical Support from 05/20/2020 in [facility] Clinical Support from 03/18/2020 in [facility]  PHQ-2 Total Score 0 3 1 2  0  PHQ-9 Total Score 8 12 1 5  0      Flowsheet Row Counselor from 02/02/2021 in [facility] Most recent reading at 02/06/2021 12:42 PM ED from 02/03/2021 in [facility] EMERGENCY  DEPARTMENT Most recent reading at 02/03/2021 12:33 AM ED from 01/29/2021 in [facility] EMERGENCY DEPARTMENT Most recent reading at 01/29/2021  3:47 AM  C-SSRS RISK CATEGORY No Risk No Risk No Risk        Assessment and Plan: Today notes that he is more anxious because of life stressors and notes that he is unable to sleep.  He requested provider refill amitriptyline as he was unable to pick it up.  Provider was agreeable to this request.  He will continue gabapentin and [name] as prescribed.  1. Bipolar I disorder, most recent episode depressed (HCC)  Continue- amitriptyline (ELAVIL) 100 MG tablet; Take 1 tablet (100 mg total) by mouth at bedtime.  Dispense: 30 tablet; Refill: 3      Follow-up in 1 month for medication management Follow-up in 1 month for [name] injection Shanna Cisco, NP 02/09/2021, 10:32 AM

## 2021-02-16 ENCOUNTER — Other Ambulatory Visit (HOSPITAL_COMMUNITY): Payer: Self-pay | Admitting: Psychiatry

## 2021-02-16 ENCOUNTER — Telehealth (HOSPITAL_COMMUNITY): Payer: Self-pay | Admitting: *Deleted

## 2021-02-16 DIAGNOSIS — F3130 Bipolar disorder, current episode depressed, mild or moderate severity, unspecified: Secondary | ICD-10-CM

## 2021-02-16 MED ORDER — AMITRIPTYLINE HCL 100 MG PO TABS: 100.0000 mg | ORAL_TABLET | Freq: Every day | ORAL | 3 refills | Status: DC

## 2021-02-16 MED ORDER — GABAPENTIN 400 MG PO CAPS: 400.0000 mg | ORAL_CAPSULE | Freq: Three times a day (TID) | ORAL | 3 refills | Status: DC

## 2021-02-16 MED ORDER — ARIPIPRAZOLE ER 400 MG IM SRER: 400.0000 mg | INTRAMUSCULAR | 9 refills | Status: DC

## 2021-02-16 NOTE — Telephone Encounter (Signed)
Medications refilled and sent to preferred pharmacy.

## 2021-02-16 NOTE — Telephone Encounter (Signed)
In as a walk in to [place] on latest events in his life. States he thinks he has finally broken up with his girlfriend who he has been with for months and had hoped he was going to raise her child. She threw his meds away, called the police on him when he was last there this weekend and now he is asking for new rxs. He plans to take her to small claims court. States the last rx called in to [facility] [address] pharmacy he never picked up. Will ask Brittney DNP to move his meds to the [address] pharmacy

## 2021-02-22 ENCOUNTER — Telehealth (HOSPITAL_COMMUNITY): Payer: Self-pay | Admitting: Emergency Medicine

## 2021-02-22 NOTE — BH Assessment (Signed)
Care Management - Follow Up Discharges   Writer attempted to make contact with patient today and was unsuccessful.  Writer left a HIPPA compliant voice message.   Per chart review, patient had a follow-up appointment with establishedprovider Toy Cookey, NP on 02-16-21 and Deloris Ping, LCSW on 04-19-21

## 2021-03-03 ENCOUNTER — Telehealth (HOSPITAL_COMMUNITY): Payer: Self-pay | Admitting: *Deleted

## 2021-03-03 NOTE — Telephone Encounter (Signed)
Call from patient stating his ex wife put a 50-B out on him and he has a court date on 03/07/21. He would like myself and Paige to appear in court on his behalf to say he is keeping up with his appts. I told him I would not appear in court unless I was subpoenaed but I would be happy to provide a letter for him saying he was keeping his appts. He will pick the letter up on Friday.  ?

## 2021-03-05 ENCOUNTER — Telehealth (HOSPITAL_COMMUNITY): Payer: Self-pay | Admitting: *Deleted

## 2021-03-05 ENCOUNTER — Encounter (HOSPITAL_COMMUNITY): Payer: Self-pay | Admitting: *Deleted

## 2021-03-05 NOTE — Telephone Encounter (Signed)
Patient called on Wednesday to report to writer he has a court date on March 6th in [facility] and would like me and the other staff he sees here at the clinic to go to court with him. The woman he had been seeing that had the baby he wanted to claim as his own is taking out a 46 B on him. I told him I would not go to court nor would any one else unless subpoenaed but I would provide a letter for him. He is to pick up the letter today. In the letter I states he is compliant with his shot appt and seeing the provider and the dates of his next appts and most recent ones. Left at the front desk for him to pick up as we discussed when he intially called.  ?

## 2021-03-09 ENCOUNTER — Ambulatory Visit (HOSPITAL_COMMUNITY): Payer: Medicare Other | Admitting: *Deleted

## 2021-03-09 ENCOUNTER — Encounter (HOSPITAL_COMMUNITY): Payer: Self-pay | Admitting: Family

## 2021-03-09 ENCOUNTER — Ambulatory Visit (INDEPENDENT_AMBULATORY_CARE_PROVIDER_SITE_OTHER): Payer: Medicare Other | Admitting: Family

## 2021-03-09 ENCOUNTER — Other Ambulatory Visit: Payer: Self-pay

## 2021-03-09 VITALS — BP 162/110 | HR 92 | Ht 66.0 in | Wt 254.0 lb

## 2021-03-09 DIAGNOSIS — F3130 Bipolar disorder, current episode depressed, mild or moderate severity, unspecified: Secondary | ICD-10-CM

## 2021-03-09 DIAGNOSIS — F3110 Bipolar disorder, current episode manic without psychotic features, unspecified: Secondary | ICD-10-CM

## 2021-03-09 NOTE — Progress Notes (Signed)
In as scheduled for his monthly injection of [name] M 400 mg, given today in his L DELTOID. He presents hypomanic, much going on in his life, Cant stay quiet or on one subject. He reports poor sleep. He did not come by as discussed for his letter for his court date fri as we had discussed and states he wish he would have. His ex wife did go thru with the 50 b as did the court. He is still continuing a relationship with the mother of the baby he wanted to claim as his but paternity test said it was not his. His provider is on maternity leave. Will see NP today thru clinic to discuss his meds. He is not taking a mood stabilizer and his BP is high today. Waiting on NP to arrive for his assessment with her. He is to return in one month for his next shot.  ?

## 2021-03-09 NOTE — Progress Notes (Signed)
BH MD/PA/NP OP Progress Note  03/09/2021 1:00 PM Chris Rogers  MRN:  [id number]  Chief Complaint: No chief complaint on file.  HPI: Chris Rogers therapist, will see tomorrow during walk-in hours.  Visit Diagnosis:    ICD-10-CM   1. Bipolar I disorder, most recent episode depressed (HCC)  F31.30       Past Psychiatric History:  Chris Rogers is a 46 year old male seen today for follow-up psychiatric evaluation and received [name] Maintena 400mg  IM monthly. Psychiatric history includes bipolar 1 disorder, major depressive disorder and adjustment disorder.  He has been managed historically on  [name] Maintena.  He reports feeling that his mood is managed effectively by [facility]. Additionally he is followed by outpatient therapy, feels this is therapeutic as well.    Patient presents with mildly pressured speech when discussing recent stressor. Plans to follow up with outpatient counselor, Chris Rogers, on tomorrow.  Chris Rogers shares that he has recently had medications update, exchanged Depakote for Neurontin.He has recently taken his gabapentin, not exactly as prescribed. Shares that he has missed several morning and afternoon doses of gabapentin 400mg  TID. Discussed plan to use gabapentin exactly as ordered in an effort to stabilize mood. Reviewed importance of sleep, patient verbalizes understanding. Reports slept 6 hours last night, typically 8 hours average per night.   Recent stressors include a court hearing, yesterday, that resulted in him being informed, by a judge, that he will not be permitted to visit with his 91 year old son and his ex-wife for the time of one year. Patient has been given a restraining order by his ex-wife.    Patient is assessed face-to-face by nurse practitioner, chart reviewed.  Today patient is seated, no acute distress. He is appropriately groomed. He is alert and oriented, pleasant and cooperative. He has clear and coherent speech.  Behavior calm and appropriate, with good eye contact.   Mood today appears euthymic, anxious affect.    He denies suicidal and homicidal ideations currently.  He contracts verbally for safety with this Clinical research associate.      He denies both auditory and visual hallucinations.  There is no indication that he is responding to internal stimuli, no evidence of delusional thought content.  Patient is able to converse coherently with goal-directed thoughts. He denies paranoia.  Objectively there is no evidence of psychosis or delusional thinking. Patient is insightful regarding treatment and diagnosis.   Patient is tolerating medications with no adverse effects/reactions per his report.   He denies physical pain currently. He denies both alcohol and substance use.  Discussed elevated blood pressure, patient has a walk in appointment with primary care provider later this date.   Patient resides in [name] with his mother, denies access to weapons. He reports he is self - employed.  He denies alcohol and substance use.    Patient provided support and encouragement.   Plan to follow-up in one month for monthly injection. Patient educated and verbalizes understanding of mental health resources and other crisis services in the community. He is instructed to call 911 and present to the nearest emergency room should he experience any suicidal/homicidal ideation, auditory/visual/hallucinations, or detrimental worsening of his mental health condition.    Past Medical History:  Past Medical History:  Diagnosis Date   Bipolar 1 disorder (HCC)    Diabetes mellitus, type II (HCC)    Hypertension     Past Surgical History:  Procedure Laterality Date   KNEE SURGERY      Family Psychiatric History: none reported  Family History:  No family history on file.  Social History:  Social History   Socioeconomic History   Marital status: Divorced    Spouse name: Not on file   Number of children: Not on file   Years of education: Not on file   Highest education level:  Not on file  Occupational History   Not on file  Tobacco Use   Smoking status: Never   Smokeless tobacco: Never  Vaping Use   Vaping Use: Never used  Substance and Sexual Activity   Alcohol use: Not Currently   Drug use: Not Currently   Sexual activity: Not on file  Other Topics Concern   Not on file  Social History Narrative   Not on file   Social Determinants of Health   Financial Resource Strain: Not on file  Food Insecurity: Not on file  Transportation Needs: Not on file  Physical Activity: Not on file  Stress: Not on file  Social Connections: Not on file    Allergies:  Allergies  Allergen Reactions   Invega [Paliperidone] Other (See Comments)    "manic episode"   Olanzapine Other (See Comments)    Mania   Topamax [Topiramate] Other (See Comments)    Patient states "glaucoma like symptoms" and blindness    Metabolic Disorder Labs: Lab Results  Component Value Date   HGBA1C 6.5 (H) 06/22/2019   MPG 139.85 06/22/2019   No results found for: PROLACTIN Lab Results  Component Value Date   CHOL 175 06/22/2019   TRIG 56 06/22/2019   HDL 50 06/22/2019   CHOLHDL 3.5 06/22/2019   VLDL 11 06/22/2019   LDLCALC 114 (H) 06/22/2019   Lab Results  Component Value Date   TSH 2.897 06/22/2019    Therapeutic Level Labs: No results found for: LITHIUM No results found for: VALPROATE No components found for:  CBMZ  Current Medications: Current Outpatient Medications  Medication Sig Dispense Refill   amitriptyline (ELAVIL) 100 MG tablet Take 1 tablet (100 mg total) by mouth at bedtime. 30 tablet 3   amLODipine (NORVASC) 10 MG tablet Take 1 tablet (10 mg total) by mouth daily. 14 tablet 0   ARIPiprazole ER ([name] MAINTENA) 400 MG SRER injection Inject 2 mLs (400 mg total) into the muscle every 30 (thirty) days. 1 each 9   BIOTIN PO Take 1 tablet by mouth daily.     doxazosin (CARDURA) 8 MG tablet Take 8 mg by mouth daily.     gabapentin (NEURONTIN) 400 MG capsule  Take 1 capsule (400 mg total) by mouth 3 (three) times daily. 90 capsule 3   losartan-hydrochlorothiazide (HYZAAR) 100-25 MG tablet Take 1 tablet by mouth daily.     Multiple Vitamins-Minerals (MULTIVITAMIN WITH MINERALS) tablet Take 1 tablet by mouth daily.     Omega-3 Fatty Acids (FISH OIL OMEGA-3 PO) Take 1 capsule by mouth daily.     VITAMIN E PO Take 1 tablet by mouth daily.     Current Facility-Administered Medications  Medication Dose Route Frequency Provider Last Rate Last Admin   ARIPiprazole ER ([name] MAINTENA) 400 MG prefilled syringe 400 mg  400 mg Intramuscular Q28 days Toy Cookey E, NP   400 mg at 03/09/21 0840     Musculoskeletal: Strength & Muscle Tone: within normal limits Gait & Station: normal Patient leans: N/A  Psychiatric Specialty Exam: Review of Systems  Constitutional: Negative.   HENT: Negative.    Eyes: Negative.   Respiratory: Negative.    Cardiovascular: Negative.   Endocrine: Negative.  Genitourinary: Negative.   Musculoskeletal: Negative.   Skin: Negative.   Allergic/Immunologic: Negative.   Neurological: Negative.   Hematological: Negative.   All other systems reviewed and are negative.  There were no vitals taken for this visit.There is no height or weight on file to calculate BMI.  General Appearance: Fairly Groomed  Eye Contact:  Good  Speech:  Pressured  Volume:  Normal  Mood:  Anxious  Affect:  Congruent  Thought Process:  Coherent, Goal Directed, and Linear  Orientation:  Full (Time, Place, and Person)  Thought Content: Logical   Suicidal Thoughts:  No  Homicidal Thoughts:  No  Memory:  Immediate;   Good Recent;   Good  Judgement:  Intact  Insight:  Present  Psychomotor Activity:  Normal  Concentration:  Concentration: Fair and Attention Span: Fair  Recall:  Good  Fund of Knowledge: Good  Language: Good  Akathisia:  No  Handed:  Right  AIMS (if indicated): done  Assets:  Communication Skills Desire for  Improvement Financial Resources/Insurance Housing Intimacy Leisure Time  ADL's:  Intact  Cognition: WNL  Sleep:  Fair   Screenings: GAD-7    Flowsheet Row Clinical Support from 01/07/2021 in [facility] Clinical Support from 12/24/2020 in [facility] Clinical Support from 11/11/2020 in [facility] Clinical Support from 05/20/2020 in [facility] Video Visit from 01/23/2020 in [facility]  Total GAD-7 Score 7 18 1 2 6       PHQ2-9    Flowsheet Row Clinical Support from 01/07/2021 in [facility] Clinical Support from 12/24/2020 in [facility] Clinical Support from 11/11/2020 in [facility] Clinical Support from 05/20/2020 in [facility] Clinical Support from 03/18/2020 in [facility]  PHQ-2 Total Score 0 3 1 2  0  PHQ-9 Total Score 8 12 1 5  0      Flowsheet Row Counselor from 02/02/2021 in [facility] Most recent reading at 02/06/2021 12:42 PM ED from 02/03/2021 in [facility] EMERGENCY DEPARTMENT Most recent reading at 02/03/2021 12:33 AM ED from 01/29/2021 in [facility] EMERGENCY DEPARTMENT Most recent reading at 01/29/2021  3:47 AM  C-SSRS RISK CATEGORY No Risk No Risk No Risk        Assessment and Plan: Patient reviewed with Dr 01/31/2021. Follow up monthly for Long Acting Injectable [name] Maintena 400mg  IM monthly.   Collaboration of Care: Collaboration of Care:   Patient/Guardian was advised Release of Information must be obtained prior to any record release in order to collaborate their care with an outside provider. Patient/Guardian was advised if they have not already done so to contact the registration department to sign all necessary  forms in order for [facility] to release information regarding their care.   Consent: Patient/Guardian gives verbal consent for treatment and assignment of benefits for services provided during this visit. Patient/Guardian expressed understanding and agreed to proceed.    01/31/2021, FNP 03/09/2021, 1:00 PM

## 2021-03-10 ENCOUNTER — Ambulatory Visit (INDEPENDENT_AMBULATORY_CARE_PROVIDER_SITE_OTHER): Payer: Medicare Other | Admitting: Clinical

## 2021-03-10 DIAGNOSIS — F3130 Bipolar disorder, current episode depressed, mild or moderate severity, unspecified: Secondary | ICD-10-CM

## 2021-03-13 NOTE — Plan of Care (Signed)
Client was in agreement to the plan. ?

## 2021-03-13 NOTE — Progress Notes (Signed)
? ?  THERAPIST PROGRESS NOTE ? ?Session Time: 35 minutes ? ?Participation Level: Active ? ?Behavioral Response: CasualAlertEuthymic ? ?Type of Therapy: Individual Therapy ? ?Treatment Goals addressed: will cooperate with psychiatric evaluation and during follow up visits once per month ? ?ProgressTowards Goals: Progressing ? ?Interventions: CBT and Supportive ? ?Summary:  ?Crixus Hout is a 46 y.o. male who presents as a walk in for a therapy appointment. Client denied hallucinations and delusions. ?Client reported on today he has been experiencing irritability over the past week. Client reported his birthday was Monday and he found that his ex wife placed a 50b against him. Client reported it includes his ability to contact the ex wife and his 19 year old son. Client reported she fabricated a story out of "pettiness" . Client reported he has tried to remain constant in his sons life because he is concerned about the sons mental and importantly his physical health. Client reported his son has sickle cell and is over weight for his age. Client reported he went to court Monday. Client reported otherwise he received his injection yesterday and feels it is helping him to "slow down". Client reported experiencing stress living with his mother because she wants him out the house. Client acknowledged he is not always respectful towards her. Client reported his friction with her came from his history of hospitalization. Client reported he felt like he needed spiritual guidance from a pastor and not hospitalization. Client reported regarding religious beliefs he has a "prophetic anointing" and is looking to join a church to join [employer].  ?Evidence of progress towards goal:  Client reported progress to goal is medication compliance with self report that it improves his daily functioning and thought processing 7 out of 7 days per week. Client reported in stressful situations he is slow to react by taking into account  consequences for his actions.      ? ? ?Suicidal/Homicidal: Nowithout intent/plan ? ?Therapist Response:  ?Therapist began the appointment asking the client how he has been doing since last seen. ?Therapist used CBT to use active listening and positive emotional support towards his thoughts and feelings. ?Therapist used CBT to engage and ask the client to describe recent stressors impacting his mood. ?Therapist used CBT to ask the client to describe his initial reaction and thought process to the stressor. ?Therapist used CBT to ask the client about medication compliance and how it is helping with symptoms management. ?Therapist used CBT to ask the client about progress towards treatment plan goal. ?Therapist assigned the client homework to practice identifying pros and cons before reacting in stressful situations. ?Client was scheduled for next appointment.  ? ? ? ? ?Plan: Return again in 5 weeks. ? ?Diagnosis: bipolar 1 disorder, most recent episode depressed ? ?Collaboration of Care: Patient refused AEB none requested by the client at this time. ? ?Patient/Guardian was advised Release of Information must be obtained prior to any record release in order to collaborate their care with an outside provider. Patient/Guardian was advised if they have not already done so to contact the registration department to sign all necessary forms in order for [place] to release information regarding their care.  ? ?Consent: Patient/Guardian gives verbal consent for treatment and assignment of benefits for services provided during this visit. Patient/Guardian expressed understanding and agreed to proceed.  ? ?Birdena Jubilee Cecil Vandyke, LCSW ?03/10/2021 ? ?

## 2021-03-15 ENCOUNTER — Other Ambulatory Visit: Payer: Self-pay | Admitting: Registered Nurse

## 2021-03-15 ENCOUNTER — Ambulatory Visit
Admission: RE | Admit: 2021-03-15 | Discharge: 2021-03-15 | Disposition: A | Discharge status: Home / Self Care | Payer: Medicare Other | Source: Ambulatory Visit | Attending: Registered Nurse | Admitting: Registered Nurse

## 2021-03-15 DIAGNOSIS — M79641 Pain in right hand: Secondary | ICD-10-CM

## 2021-03-15 DIAGNOSIS — M25562 Pain in left knee: Secondary | ICD-10-CM

## 2021-03-17 ENCOUNTER — Ambulatory Visit (INDEPENDENT_AMBULATORY_CARE_PROVIDER_SITE_OTHER): Payer: Medicare Other | Admitting: Clinical

## 2021-03-17 ENCOUNTER — Other Ambulatory Visit: Payer: Self-pay

## 2021-03-17 DIAGNOSIS — F3130 Bipolar disorder, current episode depressed, mild or moderate severity, unspecified: Secondary | ICD-10-CM | POA: Diagnosis not present

## 2021-03-17 NOTE — Progress Notes (Signed)
? ?  THERAPIST PROGRESS NOTE ? ?Session Time: 40 minutes ? ?Participation Level: Active ? ?Behavioral Response: CasualAlertEuthymic ? ?Type of Therapy: Individual Therapy ? ?Treatment Goals addressed: review the clients response to the prescribed medications including side effects ? ?ProgressTowards Goals: Progressing ? ?Interventions: CBT and Supportive ? ?Summary:  ?Chris Rogers is a 46 y.o. male who presents for the scheduled session oriented times five, appropriately dressed, and friendly. Client denied hallucinations and delusions. ?Client reported on today she has had some stress. Client reported last night he was in jail. Client reported he was unaware of a warrant due to a failure to appear in court. Client reported he was able to get out early this morning. Client reported it is regarding a wrongful charge for trespassing while staying with his ex girlfriend. Client reported he was upset about his treatment from officials when he went to voluntarily surrender himself. Client reported he has a follow up court date in May regarding the issue. Client reported he does not think his ex girlfriend is a person that he needs to be in close communication with anymore. Client reported at his last medication management appointment he disclosed to the provider that he had stopped taking his prescription for Gabapentin due to excessive drowsiness. Client reported beginning to fall asleep at various times during day and while he is driving. Client reported the drowsiness occurs even with getting sleep during the night. Client reported he was instructed to start back on his medications. Client reported he has a habit of speeding while driving because it keep shim focused on the road. Client discussed with the therapist consequences of reckless driving with the therapist. ?Evidence of progress towards goal:  Client reported he has been mindful of side effects from his psychiatric medications and has discussed his concerns  with his provider during his last medication management appointment. Client reported he will record at least 1x per week to maintain his progress or regression since starting back on his medication within the month until his next psychiatry appointment. ? ? ?Suicidal/Homicidal: Nowithout intent/plan ? ?Therapist Response:  ?Therapist began the appointment asking the client how he has been doing since last seen. ?Therapist used CBT to engage using active listening and positive emotional support towards his thoughts and feelings. ?Therapist used CBT to engage the client asking how he attempted to problem solve his stressors. ?Therapist used CBT to engage the client and ask him to identify instances of communicating his boundaries with others and was reinforced in doing so moving forward. ?Therapist used CBT ask the client to identify his progress with frequency of use with coping skills with continued practice in his daily activity.    ?Therapist assigned client homework to practice appropriate assertive communication to vocalized his needs. ?Client was scheduled for next appointment. ? ? ? ?Plan: Return again in 5 weeks. ? ?Diagnosis: Bipolar 1 disorder, most recent episode depressed ? ?Collaboration of Care: Patient refused AEB none requested by the client at this time. ? ?Patient/Guardian was advised Release of Information must be obtained prior to any record release in order to collaborate their care with an outside provider. Patient/Guardian was advised if they have not already done so to contact the registration department to sign all necessary forms in order for [place] to release information regarding their care.  ? ?Consent: Patient/Guardian gives verbal consent for treatment and assignment of benefits for services provided during this visit. Patient/Guardian expressed understanding and agreed to proceed.  ? ?Neena Rhymes Olufemi Mofield, LCSW ?03/17/2021 ? ?

## 2021-03-17 NOTE — Plan of Care (Signed)
Client was in agreement with the plan. °

## 2021-03-24 ENCOUNTER — Ambulatory Visit (INDEPENDENT_AMBULATORY_CARE_PROVIDER_SITE_OTHER): Payer: Medicare Other | Admitting: Clinical

## 2021-03-24 DIAGNOSIS — F3130 Bipolar disorder, current episode depressed, mild or moderate severity, unspecified: Secondary | ICD-10-CM

## 2021-03-24 NOTE — Progress Notes (Signed)
? ?THERAPIST PROGRESS NOTE ? ?Session Time: 45 minutes ? ?Participation Level: Active ? ?Behavioral Response: CasualAlertIrritable ? ?Type of Therapy: Individual Therapy ? ?Treatment Goals addressed: cooperate with a initial psychiatric evaluation and follow up visits 1x per month ? ?ProgressTowards Goals: Progressing ? ?Interventions: CBT and Supportive ? ?Summary:  ?Indigo Chaddock is a 46 y.o. male who presents for the scheduled session oriented times five, appropriately dressed, and friendly. Client denied hallucinations and delusions. ?Client reported on today he has been maintaining fairly well but experiencing stressors. Client reported he was going to court with his ex girlfriend today but decided not to. Client reported he has to move out of his mothers house by this weekend. Client reported he and his mother have not been getting along. Client reported the uncle he was named after left a few houses in [place] but his mother never signed them over to him. Client reported he has to leave whether he has another place secured or not. Client reported his mother has negatively talked about him and help things over his head telling him that's why he doesn't have custody of his son. Client reported he has a upcoming court date on the 27th in [place] regarding the custody agreement with his ex wife. Client reported he is also going to put in a motion to dismiss his 50b that she put on him. Client reported he will have another court date regarding his ex girlfriend who he continues to be involved with. Client reported there are things about her that he enjoys but knows it is not the best that he continues to involve himself with her.  ?Evidence of progress towards goal:  Client reported he is taking the Gabapentin medication 0 out of 0 days per week because it is making him drowsy. ? ? ?Suicidal/Homicidal: Nowithout intent/plan ? ?Therapist Response:  ?Therapist began the appointment asking the client how is  doing since last seen. ?Therapist used CBT to engage with the client using active listening and positive emotional support towards his thoughts and feelings. ?Therapist used CBT to engage and ask the client to describe his stressors within interpersonal relationships. ?Therapist used CBT to normalize the clients emotions and discuss his learned values that would promote appropriate use boundaries to prevent legal conflict and family discord. ?Therapist used CBT ask the client to identify his progress with frequency of use with coping skills with continued practice in his daily activity.    ?Therapist used CBT to discuss resources for the clients psychosocial needs. ?Therapist assigned the client homework to think about the pros and cons of interpersonal relationships that cause him stress. ?Client was scheduled for next appointment. ? ? ? ?Plan: Return again in 5 weeks. ? ?Diagnosis: bipolar 1 disorder, most recent episode depressed ? ?Collaboration of Care: Medication Management AEB Therapist will get in communication with the clients nurse/ psychiatrist about his adverse reaction to his psychiatric medications. and Other Therapist will research the [place] [facility] [employer] program to assist with housing needs . ? ?Patient/Guardian was advised Release of Information must be obtained prior to any record release in order to collaborate their care with an outside provider. Patient/Guardian was advised if they have not already done so to contact the registration department to sign all necessary forms in order for [place] to release information regarding their care.  ? ?Consent: Patient/Guardian gives verbal consent for treatment and assignment of benefits for services provided during this visit. Patient/Guardian expressed understanding and agreed to proceed.  ? ?  Neena Rhymes Jaben Benegas, LCSW ?03/24/2021 ? ?

## 2021-04-06 ENCOUNTER — Ambulatory Visit (HOSPITAL_COMMUNITY): Payer: Medicare Other

## 2021-04-19 ENCOUNTER — Ambulatory Visit (HOSPITAL_COMMUNITY): Payer: Medicare Other | Admitting: Clinical

## 2021-04-22 ENCOUNTER — Telehealth (HOSPITAL_COMMUNITY): Payer: Medicare Other | Admitting: Psychiatry

## 2021-04-28 ENCOUNTER — Telehealth (HOSPITAL_COMMUNITY): Payer: Self-pay | Admitting: *Deleted

## 2021-04-28 NOTE — Telephone Encounter (Signed)
Called and left VM for patient to call me back to update me on other arrangements he may have made for his shot or to reschedule with me. He missed his last shot appt on 04/06/21. ?

## 2021-05-13 ENCOUNTER — Ambulatory Visit (HOSPITAL_COMMUNITY): Payer: Medicare Other | Admitting: Clinical

## 2021-06-17 ENCOUNTER — Telehealth (HOSPITAL_COMMUNITY): Payer: Self-pay | Admitting: *Deleted

## 2021-06-17 NOTE — Telephone Encounter (Signed)
Called Chris Rogers as he hasnt been in for his injection since early March. He answered his phone today and said the reason he hasnt been in started with him going to jail for a month for violating a 50 B order his kids mom took out on him. Offered him to come in on a tues or thurs when we do shot clinic to resume his treatment.

## 2021-06-29 ENCOUNTER — Ambulatory Visit (HOSPITAL_COMMUNITY)
Admission: EM | Admit: 2021-06-29 | Discharge: 2021-06-30 | Disposition: A | Discharge status: Home / Self Care | Payer: Medicare Other | Attending: Student in an Organized Health Care Education/Training Program | Admitting: Student in an Organized Health Care Education/Training Program

## 2021-06-29 DIAGNOSIS — R001 Bradycardia, unspecified: Secondary | ICD-10-CM | POA: Diagnosis not present

## 2021-06-29 DIAGNOSIS — Z760 Encounter for issue of repeat prescription: Secondary | ICD-10-CM | POA: Insufficient documentation

## 2021-06-29 DIAGNOSIS — F200 Paranoid schizophrenia: Secondary | ICD-10-CM | POA: Diagnosis not present

## 2021-06-29 DIAGNOSIS — Z20822 Contact with and (suspected) exposure to covid-19: Secondary | ICD-10-CM | POA: Insufficient documentation

## 2021-06-29 DIAGNOSIS — F319 Bipolar disorder, unspecified: Secondary | ICD-10-CM | POA: Diagnosis present

## 2021-06-29 DIAGNOSIS — Z79899 Other long term (current) drug therapy: Secondary | ICD-10-CM | POA: Diagnosis not present

## 2021-06-29 DIAGNOSIS — I10 Essential (primary) hypertension: Secondary | ICD-10-CM | POA: Diagnosis not present

## 2021-06-29 DIAGNOSIS — Z5902 Unsheltered homelessness: Secondary | ICD-10-CM | POA: Diagnosis not present

## 2021-06-29 LAB — COMPREHENSIVE METABOLIC PANEL
ALT: 20 U/L (ref 0–44)
AST: 21 U/L (ref 15–41)
Albumin: 4.6 g/dL (ref 3.5–5.0)
Alkaline Phosphatase: 80 U/L (ref 38–126)
Anion gap: 9 (ref 5–15)
BUN: 9 mg/dL (ref 6–20)
CO2: 26 mmol/L (ref 22–32)
Calcium: 10 mg/dL (ref 8.9–10.3)
Chloride: 105 mmol/L (ref 98–111)
Creatinine, Ser: 1.09 mg/dL (ref 0.61–1.24)
GFR, Estimated: >60 mL/min (ref >60)
Glucose, Bld: 96 mg/dL (ref 70–99)
Potassium: 4.4 mmol/L (ref 3.5–5.1)
Sodium: 140 mmol/L (ref 135–145)
Total Bilirubin: 1.6 mg/dL — ABNORMAL HIGH (ref 0.3–1.2)
Total Protein: 7.8 g/dL (ref 6.5–8.1)

## 2021-06-29 LAB — CBC WITH DIFFERENTIAL/PLATELET
Abs Immature Granulocytes: 0.03 10*3/uL (ref 0.00–0.07)
Basophils Absolute: 0 10*3/uL (ref 0.0–0.1)
Basophils Relative: 1 %
Eosinophils Absolute: 0.1 10*3/uL (ref 0.0–0.5)
Eosinophils Relative: 1 %
HCT: 48.3 % (ref 39.0–52.0)
Hemoglobin: 16 g/dL (ref 13.0–17.0)
Immature Granulocytes: 1 %
Lymphocytes Relative: 27 %
Lymphs Abs: 1.7 10*3/uL (ref 0.7–4.0)
MCH: 26.8 pg (ref 26.0–34.0)
MCHC: 33.1 g/dL (ref 30.0–36.0)
MCV: 80.9 fL (ref 80.0–100.0)
Monocytes Absolute: 0.4 10*3/uL (ref 0.1–1.0)
Monocytes Relative: 7 %
Neutro Abs: 4.2 10*3/uL (ref 1.7–7.7)
Neutrophils Relative %: 63 %
Platelets: 274 10*3/uL (ref 150–400)
RBC: 5.97 MIL/uL — ABNORMAL HIGH (ref 4.22–5.81)
RDW: 14.7 % (ref 11.5–15.5)
WBC: 6.4 10*3/uL (ref 4.0–10.5)
nRBC: 0 % (ref 0.0–0.2)

## 2021-06-29 LAB — LIPID PANEL
Cholesterol: 210 mg/dL — ABNORMAL HIGH (ref 0–200)
HDL: 60 mg/dL (ref >40)
LDL Cholesterol: 142 mg/dL — ABNORMAL HIGH (ref 0–99)
Total CHOL/HDL Ratio: 3.5 RATIO
Triglycerides: 39 mg/dL (ref <150)
VLDL: 8 mg/dL (ref 0–40)

## 2021-06-29 LAB — POCT URINE DRUG SCREEN - MANUAL ENTRY (I-SCREEN)
POC Amphetamine UR: NOT DETECTED
POC Buprenorphine (BUP): NOT DETECTED
POC Cocaine UR: NOT DETECTED
POC Marijuana UR: NOT DETECTED
POC Methadone UR: NOT DETECTED
POC Methamphetamine UR: NOT DETECTED
POC Morphine: NOT DETECTED
POC Oxazepam (BZO): NOT DETECTED
POC Oxycodone UR: NOT DETECTED
POC Secobarbital (BAR): NOT DETECTED

## 2021-06-29 LAB — POC SARS CORONAVIRUS 2 AG: SARSCOV2ONAVIRUS 2 AG: NEGATIVE

## 2021-06-29 LAB — ETHANOL: Alcohol, Ethyl (B): <10 mg/dL (ref <10)

## 2021-06-29 LAB — URINALYSIS, ROUTINE W REFLEX MICROSCOPIC
Bilirubin Urine: NEGATIVE
Glucose, UA: NEGATIVE mg/dL
Hgb urine dipstick: NEGATIVE
Ketones, ur: NEGATIVE mg/dL
Leukocytes,Ua: NEGATIVE
Nitrite: NEGATIVE
Protein, ur: NEGATIVE mg/dL
Specific Gravity, Urine: 1.016 (ref 1.005–1.030)
pH: 5 (ref 5.0–8.0)

## 2021-06-29 LAB — TSH: TSH: 2.29 u[IU]/mL (ref 0.350–4.500)

## 2021-06-29 LAB — RESP PANEL BY RT-PCR (FLU A&B, COVID) ARPGX2
Influenza A by PCR: NEGATIVE
Influenza B by PCR: NEGATIVE
SARS Coronavirus 2 by RT PCR: NEGATIVE

## 2021-06-29 MED ORDER — MAGNESIUM HYDROXIDE 400 MG/5ML PO SUSP: 30.0000 mL | Freq: Every day | ORAL | Status: DC | PRN

## 2021-06-29 MED ORDER — LOSARTAN POTASSIUM 50 MG PO TABS
50.0000 mg | ORAL_TABLET | Freq: Once | ORAL | Status: AC
  Administered 2021-06-29: 50 mg via ORAL
  Filled 2021-06-29: qty 1

## 2021-06-29 MED ORDER — ARIPIPRAZOLE 10 MG PO TABS
20.0000 mg | ORAL_TABLET | Freq: Once | ORAL | Status: AC
  Administered 2021-06-29: 20 mg via ORAL
  Filled 2021-06-29: qty 2

## 2021-06-29 MED ORDER — ACETAMINOPHEN 325 MG PO TABS: 650.0000 mg | ORAL_TABLET | Freq: Four times a day (QID) | ORAL | Status: DC | PRN

## 2021-06-29 MED ORDER — HYDROXYZINE HCL 25 MG PO TABS
25.0000 mg | ORAL_TABLET | Freq: Three times a day (TID) | ORAL | Status: DC | PRN
  Filled 2021-06-29: qty 1

## 2021-06-29 MED ORDER — ALUM & MAG HYDROXIDE-SIMETH 200-200-20 MG/5ML PO SUSP: 30.0000 mL | ORAL | Status: DC | PRN

## 2021-06-29 NOTE — ED Notes (Signed)
Pt A&O x 4, sleeping at present, no distress noted. Passive SI, contracts for safety.  Monitoring for safety.

## 2021-06-29 NOTE — ED Notes (Signed)
Patient reports SI with no plan or intent. Patient denies HI and AVH. Patient reports that one of his main stressors is being homeless. Patient A&Ox4.  Patient denies any physical complaints when asked. No acute distress noted. Support and encouragement provided. Routine safety checks conducted according to facility protocol. Encouraged patient to notify staff if thoughts of harm toward self or others arise. Patient verbalize understanding and agreement. Will continue to monitor for safety. Meal given and  patient had a shower.

## 2021-06-29 NOTE — ED Notes (Signed)
Patient resting quietly in bed with eyes closed, Respirations equal and unlabored, skin warm and dry, NAD. Routine safety checks conducted according to facility protocol. Will continue to monitor for safety. 

## 2021-06-29 NOTE — Discharge Instructions (Addendum)
You have a follow-up medication management appointment at:       [facility]      [address] [place], [place] [address]      [phone] phone  29 July 2021 @ 10:00 am  Please arrive no later than 15 minutes before your appointment to check in and be on time.  SHELTERS  [facility] [address] [place], [place], [place] [address] [phone] Population served: Adult men & women (105 years old and older, able to perform activities for daily living) Documents required: Valid ID & Social Security Card  [facility] [address] [place], [place] [address] [phone] Population served: Males 18+ Documents required: Valid ID & Social Security Card  [facility] [place] [address] [place], [place] [address] [phone] Population served: Single adults and families with children Documents required: Valid ID & another form of identification  [facility] [employer] [address] [place], [place] [address] [phone] Population Served: Families with children, adult women, and adult men.  [facility] [address] [place], [place] [address] [phone] Population served: Men 18+, preference for disabled and/or veterans Eligibility: By referral only   HOT MEALS  Please call the churches/agencies/organizations listed below to learn more about when and where hot meals are provided.   [facility] [address], [place], [place] [address]  [facility] [address] [place], [place], [place] [address]  [facility] ([address] [place], [place] [address] Dinner: Tuesdays & Thursdays 6:00PM - 6:30PM  [facility] [address], [place], [place]  [address] [phone]  [facility] [address], [place], [place] [address] [phone]  [facility]  [address], [place], [place] [address] [phone]  Patient is instructed prior to discharge to:  Take all medications as prescribed by his/her mental healthcare provider. Report any adverse effects and or reactions from the medicines to his/her outpatient provider promptly. Keep all scheduled appointments, to ensure that you are getting refills on time and to avoid any interruption in your medication.  If you are unable to keep an appointment call to reschedule.  Be sure to follow-up with resources and follow-up appointments provided.  Patient has been instructed & cautioned: To not engage in alcohol and or illegal drug use while on prescription medicines. In the event of worsening symptoms, patient is instructed to call the crisis hotline, 911 and or go to the nearest ED for appropriate evaluation and treatment of symptoms. To follow-up with his/her primary care provider for your other medical issues, concerns and or health care needs.

## 2021-06-30 ENCOUNTER — Other Ambulatory Visit: Payer: Self-pay

## 2021-06-30 DIAGNOSIS — F319 Bipolar disorder, unspecified: Secondary | ICD-10-CM | POA: Diagnosis not present

## 2021-06-30 DIAGNOSIS — F200 Paranoid schizophrenia: Secondary | ICD-10-CM | POA: Diagnosis not present

## 2021-06-30 LAB — HEMOGLOBIN A1C
Hgb A1c MFr Bld: 6.3 % — ABNORMAL HIGH (ref 4.8–5.6)
Mean Plasma Glucose: 134 mg/dL

## 2021-06-30 MED ORDER — ARIPIPRAZOLE ER 400 MG IM SRER
400.0000 mg | Freq: Once | INTRAMUSCULAR | Status: AC
  Administered 2021-06-30: 400 mg via INTRAMUSCULAR

## 2021-06-30 MED ORDER — ARIPIPRAZOLE 10 MG PO TABS
20.0000 mg | ORAL_TABLET | Freq: Every day | ORAL | Status: DC
  Administered 2021-06-30: 20 mg via ORAL
  Filled 2021-06-30: qty 28
  Filled 2021-06-30: qty 2

## 2021-06-30 NOTE — ED Notes (Signed)
Patient was given breakfast. PB&J and apple juice

## 2021-06-30 NOTE — ED Notes (Signed)
Patient A&Ox4. Denies intent to harm self/others when asked. Denies A/VH. Patient denies any physical complaints when asked. No acute distress noted. Support and encouragement provided. Routine safety checks conducted according to facility protocol. Encouraged patient to notify staff if thoughts of harm toward self or others arise. Patient verbalize understanding and agreement. Will continue to monitor for safety.    

## 2021-06-30 NOTE — ED Notes (Signed)
Pt sleeping at present, no distress noted.  Monitoring for safety. 

## 2021-06-30 NOTE — ED Provider Notes (Signed)
FBC/OBS ASAP Discharge Summary  Date and Time: 06/30/2021 5:22 PM  Name: Chris Rogers  MRN:  [id number]   Discharge Diagnoses:  Final diagnoses:  Schizophrenia, paranoid (HCC)   Subjective:   Pt reassessed by nurse practitioner today.   Pt w/ reported hx of bipolar 1 disorder.   Pt reports he came to this facility yesterday due to chronic passive SI, w/o plan or intent, wanting to restart his medications and "get my life together". Reports he has not taken any of his medications since release from jail on 05/03/21. While in jail, pt was taking PO abilify. States prior to going to jail he was on LAI [name] Maintenna, last had injection in March 2023. Pt reports he has a court date on 07/09/21 for violating B50 against his ex-wife and son.   Pt reports euthymic mood today. Reports appetite and sleep have been good while on the unit. He denies SI/VI/HI, AVH, paranoia.   Pt reports he is currently receiving disability checks. He is forward thinking and states he plans on securing stable housing.   Pt denies hx of NSSI or SA.   Pt reports family psychiatric hx is positive. States his father carried dx of bipolar disorder, died by completed suicide. Pt states his brother has "some issues too".   Pt denies alcohol, marijuana, crack/cocaine, other SU. Utox reviewed and negative.   Pt reports he is living alone, is currently homeless, and is living in his car.   Reports highest level of education was BS in Biology.   Discussed w/ pt that his blood pressure has been elevated, last reading 06/30/21 162/100. Pt denies HA, dizziness, sensory changes, weakness, blurry vision, cp, palpitations, sob, abd pain, fever, chills. Abnml lab results were reviewed w/ pt. Discussed plan for discharge w/ recommendation for establishing primary care, psychiatric medication management and counseling. SW was consulted yesterday and appointment made for pt's next LAI on July 29 2021 at 10:00AM at [facility] outpatient services. Pt was reminded of this appointment. Pt agrees w/ plan. Pt verbally contracts to safety for himself and others upon discharge. Safety planning completed, and pt agrees to call 911/EMS or go to the nearest emergency room for any worsening of condition.  Pt is a&ox3, in no acute distress, non toxic appearing. He appears fairly groomed, appropriate for environment. Eye contact is good. Speech is clear and coherent, w/ nml rate and volume. Reported mood is euthymic. Affect is blunt. TP is coherent, goal directed, linear. Description of associations is intact. TC is logical. There is no evidence of internal preoccupation, agitation, aggression or distractibility. No delusions or paranoia elicited. Pt is calm, cooperative, pleasant.   Stay Summary:  Pt is a 46 y/o male w/ hx of bipolar 1 disorder presenting to [facility] on 06/29/21 wanting to restart his medications. Yesterday, 06/29/24, pt started on PO abilify 20mg . On reassessment today, pt denies SI/VIHI, AVH, paranoia. Today, pt was administered [name] Maintena 400mg  LAI. He was given 13 day samples for oral abilify 20mg  (1 dose given prior to discharge). Pt discharged w/ recommendation for establishing primary care, psychiatric medication medication management and counseling. Pt has his next LAI appointment at [facility] outpatient services on July 29 2021 at 10:00AM. Pt verbally contracts to safety for himself and others upon discharge.   Total Time spent with patient: 30 minutes  Past Psychiatric History: Hx of bipolar 1 disorder Past Medical History:  Past Medical History:  Diagnosis Date   Bipolar 1  disorder (HCC)    Diabetes mellitus, type II (HCC)    Hypertension     Past Surgical History:  Procedure Laterality Date   KNEE SURGERY     Family History: No family history on file. Family Psychiatric History: Pt reports family psychiatric hx is positive. States his father carried dx of  bipolar disorder, died by completed suicide. Pt states his brother has "some issues too".  Social History:  Social History   Substance and Sexual Activity  Alcohol Use Not Currently     Social History   Substance and Sexual Activity  Drug Use Not Currently    Social History   Socioeconomic History   Marital status: Divorced    Spouse name: Not on file   Number of children: Not on file   Years of education: Not on file   Highest education level: Not on file  Occupational History   Not on file  Tobacco Use   Smoking status: Never   Smokeless tobacco: Never  Vaping Use   Vaping Use: Never used  Substance and Sexual Activity   Alcohol use: Not Currently   Drug use: Not Currently   Sexual activity: Not on file  Other Topics Concern   Not on file  Social History Narrative   Not on file   Social Determinants of Health   Financial Resource Strain: Not on file  Food Insecurity: Not on file  Transportation Needs: Not on file  Physical Activity: Not on file  Stress: Not on file  Social Connections: Not on file   SDOH:  SDOH Screenings   Alcohol Screen: Not on file  Depression (PHQ2-9): Low Risk  (03/09/2021)   Depression (PHQ2-9)    PHQ-2 Score: 0  Recent Concern: Depression (PHQ2-9) - Medium Risk (01/07/2021)   Depression (PHQ2-9)    PHQ-2 Score: 8  Financial Resource Strain: Not on file  Food Insecurity: Not on file  Housing: Not on file  Physical Activity: Not on file  Social Connections: Not on file  Stress: Not on file  Tobacco Use: Low Risk  (03/09/2021)   Patient History    Smoking Tobacco Use: Never    Smokeless Tobacco Use: Never    Passive Exposure: Not on file  Transportation Needs: Not on file    Tobacco Cessation:  N/A, patient does not currently use tobacco products  Current Medications:  No current facility-administered medications for this encounter.   Current Outpatient Medications  Medication Sig Dispense Refill   amitriptyline (ELAVIL) 100  MG tablet Take 1 tablet (100 mg total) by mouth at bedtime. 30 tablet 3   ARIPiprazole ER ([name] MAINTENA) 400 MG SRER injection Inject 2 mLs (400 mg total) into the muscle every 30 (thirty) days. 1 each 9   atenolol (TENORMIN) 25 MG tablet Take 25 mg by mouth daily.     losartan (COZAAR) 100 MG tablet Take 100 mg by mouth daily.      PTA Medications: (Not in a hospital admission)      03/09/2021    1:21 PM 01/07/2021    8:37 AM 12/24/2020   10:23 AM  Depression screen PHQ 2/9  Decreased Interest 0 0 0  Down, Depressed, Hopeless 0  3  PHQ - 2 Score 0 0 3  Altered sleeping  3 3  Tired, decreased energy  0 0  Change in appetite  2 0  Feeling bad or failure about yourself   0 3  Trouble concentrating  0 0  Moving slowly or  fidgety/restless  3 3  Suicidal thoughts  0 0  PHQ-9 Score  8 12  Difficult doing work/chores  Not difficult at all Somewhat difficult    Flowsheet Row ED from 06/29/2021 in [facility] Office Visit from 03/09/2021 in [facility] ED from 02/03/2021 in [facility] EMERGENCY DEPARTMENT  C-SSRS RISK CATEGORY Low Risk No Risk No Risk       Musculoskeletal  Strength & Muscle Tone: within normal limits Gait & Station: normal Patient leans: N/A  Psychiatric Specialty Exam  Presentation  General Appearance: Appropriate for Environment; Fairly Groomed  Eye Contact:Good  Speech:Clear and Coherent; Normal Rate  Speech Volume:Normal  Handedness:Right   Mood and Affect  Mood:Euthymic  Affect:Blunt   Thought Process  Thought Processes:Coherent; Goal Directed; Linear  Descriptions of Associations:Intact  Orientation:Full (Time, Place and Person)  Thought Content:Logical  Diagnosis of Schizophrenia or Schizoaffective disorder in past: No    Hallucinations:Hallucinations: None  Ideas of Reference:None  Suicidal Thoughts:Suicidal Thoughts: No  Homicidal Thoughts:Homicidal  Thoughts: No   Sensorium  Memory:Recent Fair; Immediate Fair  Judgment:Fair  Insight:Present   Executive Functions  Concentration:Fair  Attention Span:Fair  Recall:Good  Fund of Knowledge:Fair  Language:Good   Psychomotor Activity  Psychomotor Activity:Psychomotor Activity: Normal   Assets  Assets:Desire for Improvement; Manufacturing systems engineer; Financial Resources/Insurance; Resilience   Sleep  Sleep:Sleep: Fair   No data recorded  Physical Exam  Physical Exam Cardiovascular:     Rate and Rhythm: Normal rate.  Pulmonary:     Effort: Pulmonary effort is normal.  Neurological:     Mental Status: He is alert and oriented to person, place, and time.  Psychiatric:        Attention and Perception: Attention and perception normal.        Mood and Affect: Mood normal. Affect is blunt.        Speech: Speech normal.        Behavior: Behavior normal. Behavior is cooperative.        Thought Content: Thought content normal.        Cognition and Memory: Cognition and memory normal.        Judgment: Judgment normal.    Review of Systems  Constitutional:  Negative for chills and fever.  Eyes:  Negative for blurred vision.  Respiratory:  Negative for shortness of breath.   Cardiovascular:  Negative for chest pain and palpitations.  Gastrointestinal:  Negative for abdominal pain.  Neurological:  Negative for dizziness, sensory change, weakness and headaches.  Psychiatric/Behavioral: Negative.     Blood pressure (!) 162/100, pulse 75, temperature 97.7 F (36.5 C), temperature source Oral, resp. rate 18, SpO2 100 %. There is no height or weight on file to calculate BMI.  Demographic Factors:  Male, Low socioeconomic status, Living alone, and Unemployed  Loss Factors: Legal issues  Historical Factors: Family history of suicide and Family history of mental illness or substance abuse  Risk Reduction Factors:   Forward thinking  Continued Clinical Symptoms:   Previous Psychiatric Diagnoses and Treatments  Cognitive Features That Contribute To Risk:  None    Suicide Risk:  Minimal: No identifiable suicidal ideation.  Patients presenting with no risk factors but with morbid ruminations; may be classified as minimal risk based on the severity of the depressive symptoms  Plan Of Care/Follow-up recommendations:  Follow up with outpatient resources  Disposition:  Discharge  Lauree Chandler, NP 06/30/2021, 5:22 PM

## 2021-06-30 NOTE — BH Assessment (Signed)
LCSW Progress Note  Per Kelle Darting, NP, this pt does not require psychiatric hospitalization at this time.  Pt is psychiatrically cleared.  Discharge instructions include appointment for injectable on 29 July 2021 @ 1000, and resources for shelters and hot meals as he is homeless.  EDP Kelle Darting, NP, has been notified.  Hansel Starling, MSW, LCSW [facility] [phone] or [phone]

## 2021-06-30 NOTE — ED Notes (Signed)
Patient A&O x 4, ambulatory. Patient discharged in no acute distress. Patient denied SI/HI, A/VH upon discharge. Patient verbalized understanding of all discharge instructions explained by staff, to include follow up appointments, RX's and safety plan. Patient reported mood 10/10.  Pt belongings returned to patient from locker #17 intact. Patient escorted to lobby via staff for transport to destination. Safety maintained.  

## 2021-07-29 ENCOUNTER — Ambulatory Visit (HOSPITAL_COMMUNITY): Payer: Self-pay

## 2021-07-29 ENCOUNTER — Telehealth (HOSPITAL_COMMUNITY): Payer: Self-pay | Admitting: *Deleted

## 2021-07-29 NOTE — Telephone Encounter (Signed)
No showed for his appt today, is very late on his shot thru this clinic. I left a vm on his home phone, when I called his cell number it rolled to the home number. I asked him to call and schedule an appt.

## 2021-08-03 ENCOUNTER — Encounter (HOSPITAL_COMMUNITY): Payer: Self-pay

## 2021-08-03 ENCOUNTER — Ambulatory Visit (HOSPITAL_COMMUNITY): Payer: Self-pay

## 2021-08-03 ENCOUNTER — Ambulatory Visit (HOSPITAL_COMMUNITY): Payer: Medicare Other

## 2021-08-10 ENCOUNTER — Ambulatory Visit (HOSPITAL_COMMUNITY): Payer: Self-pay

## 2021-08-10 ENCOUNTER — Ambulatory Visit (HOSPITAL_COMMUNITY): Payer: Medicare Other

## 2021-08-11 ENCOUNTER — Ambulatory Visit (HOSPITAL_COMMUNITY): Payer: Medicare Other

## 2021-08-11 ENCOUNTER — Encounter (HOSPITAL_COMMUNITY): Payer: Self-pay

## 2021-08-11 ENCOUNTER — Ambulatory Visit (INDEPENDENT_AMBULATORY_CARE_PROVIDER_SITE_OTHER): Payer: Medicare Other | Admitting: Student in an Organized Health Care Education/Training Program

## 2021-08-11 VITALS — BP 169/118 | HR 75 | Ht 66.0 in | Wt 232.0 lb

## 2021-08-11 DIAGNOSIS — F3130 Bipolar disorder, current episode depressed, mild or moderate severity, unspecified: Secondary | ICD-10-CM

## 2021-08-11 MED ORDER — ARIPIPRAZOLE ER 400 MG IM PRSY
400.0000 mg | PREFILLED_SYRINGE | Freq: Once | INTRAMUSCULAR | Status: AC
  Administered 2021-08-11: 400 mg via INTRAMUSCULAR

## 2021-08-11 NOTE — Progress Notes (Signed)
BH MD/PA/NP OP Progress Note  08/11/2021 1:58 PM Chris Rogers  MRN:  [id number]  Chief Complaint:  Chief Complaint  Patient presents with   Injections   HPI:  Chris Rogers is a 46 year old male seen today for follow-up psychiatric evaluation and received [name] Maintena 400mg  IM monthly. Psychiatric history includes bipolar 1 disorder, major depressive disorder and adjustment disorder.  He has been managed historically on  [name] Maintena.   Patient reports that he did miss 1 month after receiving an emergent injection in the [facility]. Patient reports that his GF encouraged him to get his injection now, but he also feels that the relationship is toxic. Patient reports that his GF was also recently hospitalized for Bipolar disorder and also feels that he can not provide what she wants.  Patient reports that he also tends to gamble more when he is around her more. Patient reports that he feels that his gambling has worsened in frequency with her. Patient reports he is spending the majority of his check gambling. Patient reports that this has been an issue the last 4-5 mon. Patient reports that he may need some help with his money, and may be considering getting a payee.   Patient reports that he feels accepted in the places where he gambles. Patient reports that his bipolar disorder has led to him making "enemies" with others and the place he gambles feels safe.   Patient reports that his ex-wife was his payee in the past for a few months, but this upset him; however he now thinks he may need someone again.   Patient reports that his appetite has been poor and believes that this is because he is "depressed." Patient reports he feels purposeless. Objectively patient concentration is poor and patient confirms that he is having difficulty completing task that would have been previously enjoyable. Patient endorses significant lack of motivation.  Patient denies any new restraining order violations. Patient  reports he is staying with at his mother's home, but she is not in the area. Patient reports that he is not sure if he has a support system, outside of his mom but because she is not in town this can be limiting.   Patient reports he is conflicted about support in religion because of his hx of hyper religiosity while manic and also since of shame that "God made me bipolar."   Patient endorses SI, but reports he has protective factors including thinking about his son and how his son would handle this and other family members. Patient reports that his own father killed himself and that this was a terrible experience. Patient denies HI and AVH.   Visit Diagnosis:    ICD-10-CM   1. Bipolar I disorder, most recent episode depressed (HCC)  F31.30       Past Psychiatric History:  Bipolar 1 disorder,  Hx of Depakote, Gabapentin and Ablilfy Maintena 400mg   Past Medical History:  Past Medical History:  Diagnosis Date   Bipolar 1 disorder (HCC)    Diabetes mellitus, type II (HCC)    Hypertension     Past Surgical History:  Procedure Laterality Date   KNEE SURGERY      Family Psychiatric History: Father died by suicide  Family History: History reviewed. No pertinent family history.  Social History:  Social History   Socioeconomic History   Marital status: Divorced    Spouse name: Not on file   Number of children: Not on file   Years of education: Not on file  Highest education level: Not on file  Occupational History   Not on file  Tobacco Use   Smoking status: Never   Smokeless tobacco: Never  Vaping Use   Vaping Use: Never used  Substance and Sexual Activity   Alcohol use: Not Currently   Drug use: Not Currently   Sexual activity: Not on file  Other Topics Concern   Not on file  Social History Narrative   Not on file   Social Determinants of Health   Financial Resource Strain: Not on file  Food Insecurity: Not on file  Transportation Needs: Not on file  Physical  Activity: Not on file  Stress: Not on file  Social Connections: Not on file    Allergies:  Allergies  Allergen Reactions   Invega [Paliperidone] Other (See Comments)    "manic episode"   Olanzapine Other (See Comments)    Mania   Topamax [Topiramate] Other (See Comments)    Patient states "glaucoma like symptoms" and blindness    Metabolic Disorder Labs: Lab Results  Component Value Date   HGBA1C 6.3 (H) 06/29/2021   MPG 134 06/29/2021   MPG 139.85 06/22/2019   No results found for: "PROLACTIN" Lab Results  Component Value Date   CHOL 210 (H) 06/29/2021   TRIG 39 06/29/2021   HDL 60 06/29/2021   CHOLHDL 3.5 06/29/2021   VLDL 8 06/29/2021   LDLCALC 142 (H) 06/29/2021   LDLCALC 114 (H) 06/22/2019   Lab Results  Component Value Date   TSH 2.290 06/29/2021   TSH 2.897 06/22/2019    Therapeutic Level Labs: No results found for: "LITHIUM" No results found for: "VALPROATE" No results found for: "CBMZ"  Current Medications: Current Outpatient Medications  Medication Sig Dispense Refill   amitriptyline (ELAVIL) 100 MG tablet Take 1 tablet (100 mg total) by mouth at bedtime. 30 tablet 3   ARIPiprazole ER ([name] MAINTENA) 400 MG SRER injection Inject 2 mLs (400 mg total) into the muscle every 30 (thirty) days. 1 each 9   atenolol (TENORMIN) 25 MG tablet Take 25 mg by mouth daily.     losartan (COZAAR) 100 MG tablet Take 100 mg by mouth daily.     No current facility-administered medications for this visit.     Musculoskeletal: Strength & Muscle Tone: within normal limits Gait & Station: normal Patient leans: N/A  Psychiatric Specialty Exam: Review of Systems  Psychiatric/Behavioral:  Positive for decreased concentration and dysphoric mood.     Blood pressure (!) 169/118, pulse 75, height 5\' 6"  (1.676 m), weight 232 lb (105.2 kg), SpO2 99 %.Body mass index is 37.45 kg/m.  General Appearance: Casual  Eye Contact:  Minimal  Speech:  Clear and Coherent   Volume:  Decreased  Mood:  Dysphoric  Affect:  Congruent  Thought Process:  Goal Directed  Orientation:  Full (Time, Place, and Person)  Thought Content: WDL   Suicidal Thoughts:  Yes.  without intent/plan  Homicidal Thoughts:  No  Memory:  Immediate;   Fair Recent;   Fair Remote;   Good 4/5  Judgement:  Fair  Insight:  Fair  Psychomotor Activity:  Psychomotor Retardation  Concentration:  Concentration: Fair  Recall:  Fair 2/3  Fund of Knowledge: Good  Language: Good  Akathisia:  No    AIMS (if indicated): not done  Assets:  Housing Resilience  ADL's:  Intact  Cognition: WNL  Sleep:  Fair   Screenings: GAD-7    Flowsheet Row Clinical Support from 01/07/2021 in [facility] Clinical Support from 12/24/2020 in [facility] Clinical Support from 11/11/2020 in [facility] Clinical Support from 05/20/2020 in [facility] Video Visit from 01/23/2020 in [facility]  Total GAD-7 Score 7 18 1 2 6       PHQ2-9    Flowsheet Row Office Visit from 03/09/2021 in [facility] Clinical Support from 01/07/2021 in [facility] Clinical Support from 12/24/2020 in [facility] Clinical Support from 11/11/2020 in [facility] Clinical Support from 05/20/2020 in [facility]  PHQ-2 Total Score 0 0 3 1 2   PHQ-9 Total Score -- 8 12 1 5       Flowsheet Row Clinical Support from 08/11/2021 in [facility] from 06/29/2021 in [facility] Office Visit from 03/09/2021 in [facility]  C-SSRS RISK CATEGORY Low Risk Low Risk No Risk        Assessment and Plan: Chris Rogers is a 46 yo patient with PPH of Bipolar 1 disorder. Patient missed his injection  last month and appears to be severely depressed currently. Patient is able to contract for safety and safety planning was done. Patient also can list some protective factors. Despite this patient appears to be decompensating gradually over the last few months and his previous compliance is no longer occurring. Patient is endorsing struggling with his diagnosis and the necessity of medicine. Patient may need his medication adjusted as he he endorses some concerning behaviors during likely manic episode in 03/2021. However, no adjustments were made today as patient missed his last dose and was clearly depressed today. This provider spoke with his last psychiatric provider, Everardo Beals, NP and ask that he be added back to her schedule next month and removed from shot clinic.   Bipolar 1 disorder, current episode depression - Continue [name] Maintena 400mg   - F/U with 52, NP 09/17/2021 - F/U Dr. Karen Kitchens 8/25 - RN will send referral to [facility] house  Collaboration of Care: Collaboration of Care:   Patient/Guardian was advised Release of Information must be obtained prior to any record release in order to collaborate their care with an outside provider. Patient/Guardian was advised if they have not already done so to contact the registration department to sign all necessary forms in order for 09/19/2021 to release information regarding their care.   Consent: Patient/Guardian gives verbal consent for treatment and assignment of benefits for services provided during this visit. Patient/Guardian expressed understanding and agreed to proceed.   PGY-3 Morrie Sheldon, MD 08/11/2021, 1:58 PM

## 2021-08-11 NOTE — Progress Notes (Signed)
Patient presents to the for [name] Maintena 400 mg in right Deltoid given by Aul Mangieri CMA, Patient will return in 28 days  

## 2021-08-13 ENCOUNTER — Telehealth (HOSPITAL_COMMUNITY): Payer: Self-pay | Admitting: *Deleted

## 2021-08-13 NOTE — Telephone Encounter (Signed)
Called to follow up with him as a new appt was made for him per Dr Volney Presser after he left from getting his shot that he was not made aware of. The appt is for 8/25 with the Dr but he states he has a court date that same day re his 50B violation. He will call me after court and we can reschedule him. He also has an appt with Ilda Foil DNP on 9/5 he is aware of. Also wanted to inform him [facility] would be calling him re services there. He states they already called him and made an appt with him for an assessment on the 28 th. He states he is feeling better just getting his shot. He is appropriate an pleasant. Will wait for him to call after court to go forward with an appt with Dr Morrie Sheldon.

## 2021-08-27 ENCOUNTER — Ambulatory Visit (HOSPITAL_COMMUNITY): Payer: Medicare Other | Admitting: Student in an Organized Health Care Education/Training Program

## 2021-09-07 ENCOUNTER — Ambulatory Visit (INDEPENDENT_AMBULATORY_CARE_PROVIDER_SITE_OTHER): Payer: Medicare Other | Admitting: Psychiatry

## 2021-09-07 ENCOUNTER — Ambulatory Visit (HOSPITAL_COMMUNITY): Payer: Medicare Other

## 2021-09-07 ENCOUNTER — Encounter (HOSPITAL_COMMUNITY): Payer: Self-pay

## 2021-09-07 ENCOUNTER — Encounter (HOSPITAL_COMMUNITY): Payer: Self-pay | Admitting: Psychiatry

## 2021-09-07 VITALS — BP 144/101 | HR 67 | Ht 66.0 in | Wt 237.0 lb

## 2021-09-07 DIAGNOSIS — F3130 Bipolar disorder, current episode depressed, mild or moderate severity, unspecified: Secondary | ICD-10-CM

## 2021-09-07 MED ORDER — ARIPIPRAZOLE ER 400 MG IM PRSY
400.0000 mg | PREFILLED_SYRINGE | Freq: Once | INTRAMUSCULAR | Status: AC
  Administered 2021-09-07: 400 mg via INTRAMUSCULAR

## 2021-09-07 MED ORDER — AMITRIPTYLINE HCL 100 MG PO TABS: 100.0000 mg | ORAL_TABLET | Freq: Every day | ORAL | 3 refills | Status: DC

## 2021-09-07 MED ORDER — ARIPIPRAZOLE ER 400 MG IM SRER
400.0000 mg | INTRAMUSCULAR | 9 refills | Status: DC
Start: 2021-09-07 — End: 2021-11-04

## 2021-09-07 NOTE — Progress Notes (Signed)
BH MD/PA/NP OP Progress Note     09/07/2021 12:05 PM Chris Rogers  MRN:  [id number]  Chief Complaint: "I got locke up and I am not sleep"   HPI: 46 year old male seen today for follow up psychiatric evaluation. He has a psychiatric history of bipolar affective disorder, bipolar 1, and depression managed on amitriptyline 100 mg and [name] 400 mg q. 28 days. He notes that his medications are somewhat effective in managing his psychiatric conditions.     Today he was well-groomed, pleasant, cooperative, engaged in conversation, and maintained eye contact.  He informed Clinical research associate that he has been having a depressive state that he reports is situational.  He notes that he has no motivation to do things.  Patient reports that his relationship issues are exacerbating his mental health.  He reports that his current girlfriend is mentally unstable and toxic to the relationship.  He however reports that it is difficult for him to leave.  Patient denies being in a current manic state or having SI/HI/VAH, mania, paranoia.  Today provider conducted a GAD-7 and patient scored a 4.  Provider also conducted PHQ-9 and patient scored a 7.  He endorses adequate sleep and appetite.   Patient notes that  he takes amitriptyline as needed. Provider recommended him taking amitriptyline daily to help manage his anxiety and depression. He will continue other medications as prescribed. Provider discussed mood stabilizers or adding an antipsychotic to help manage fluctuations in mood. He however notes that he has difficulty remembering to take medications.Patient has trailed Haldol (caused stiffness), Zyprexa (induced mania), and Invega (caused mania).  No other concerns at this time.       Visit Diagnosis:    ICD-10-CM   1. Bipolar I disorder, most recent episode depressed (HCC)  F31.30 ARIPiprazole ER ([name] MAINTENA) 400 MG SRER injection    amitriptyline (ELAVIL) 100 MG tablet       Past Psychiatric History:  Bipolar affective, MDD, Bipolar 1  Past Medical History:  Past Medical History:  Diagnosis Date   Bipolar 1 disorder (HCC)    Diabetes mellitus, type II (HCC)    Hypertension     Past Surgical History:  Procedure Laterality Date   KNEE SURGERY      Family Psychiatric History: Father bipolar, sister depression, brother schizophrenia  Family History: History reviewed. No pertinent family history.  Social History:  Social History   Socioeconomic History   Marital status: Divorced    Spouse name: Not on file   Number of children: Not on file   Years of education: Not on file   Highest education level: Not on file  Occupational History   Not on file  Tobacco Use   Smoking status: Never   Smokeless tobacco: Never  Vaping Use   Vaping Use: Never used  Substance and Sexual Activity   Alcohol use: Not Currently   Drug use: Not Currently   Sexual activity: Not on file  Other Topics Concern   Not on file  Social History Narrative   Not on file   Social Determinants of Health   Financial Resource Strain: Not on file  Food Insecurity: Not on file  Transportation Needs: Not on file  Physical Activity: Not on file  Stress: Not on file  Social Connections: Not on file    Allergies:  Allergies  Allergen Reactions   Invega [Paliperidone] Other (See Comments)    "manic episode"   Olanzapine Other (See Comments)    Mania  Topamax [Topiramate] Other (See Comments)    Patient states "glaucoma like symptoms" and blindness    Metabolic Disorder Labs: Lab Results  Component Value Date   HGBA1C 6.3 (H) 06/29/2021   MPG 134 06/29/2021   MPG 139.85 06/22/2019   No results found for: "PROLACTIN" Lab Results  Component Value Date   CHOL 210 (H) 06/29/2021   TRIG 39 06/29/2021   HDL 60 06/29/2021   CHOLHDL 3.5 06/29/2021   VLDL 8 06/29/2021   LDLCALC 142 (H) 06/29/2021   LDLCALC 114 (H) 06/22/2019   Lab Results  Component Value Date   TSH 2.290 06/29/2021   TSH  2.897 06/22/2019    Therapeutic Level Labs: No results found for: "LITHIUM" No results found for: "VALPROATE" No results found for: "CBMZ"  Current Medications: Current Outpatient Medications  Medication Sig Dispense Refill   amitriptyline (ELAVIL) 100 MG tablet Take 1 tablet (100 mg total) by mouth at bedtime. 30 tablet 3   ARIPiprazole ER ([name] MAINTENA) 400 MG SRER injection Inject 2 mLs (400 mg total) into the muscle every 30 (thirty) days. 1 each 9   atenolol (TENORMIN) 25 MG tablet Take 25 mg by mouth daily.     losartan (COZAAR) 100 MG tablet Take 100 mg by mouth daily.     No current facility-administered medications for this visit.     Musculoskeletal: Strength & Muscle Tone: within normal limits Gait & Station: normal Patient leans: N/A  Psychiatric Specialty Exam: Review of Systems  There were no vitals taken for this visit.There is no height or weight on file to calculate BMI.  General Appearance: Well Groomed  Eye Contact:  Good  Speech:  Clear and Coherent and Normal Rate  Volume:  Normal  Mood:  Depressed  Affect:  Appropriate and Congruent  Thought Process:  Coherent, Goal Directed and Linear  Orientation:  Full (Time, Place, and Person)  Thought Content: WDL and Logical   Suicidal Thoughts:  No  Homicidal Thoughts:  No,   Memory:  Immediate;   Good Recent;   Good Remote;   Good  Judgement:  Good  Insight:  Good  Psychomotor Activity:  Normal  Concentration:  Concentration: Good and Attention Span: Good  Recall:  Good  Fund of Knowledge: Good  Language: Good  Akathisia:  No  Handed:  Right  AIMS (if indicated): Not done  Assets:  Communication Skills Desire for Improvement Financial Resources/Insurance Housing Social Support  ADL's:  Intact  Cognition: WNL  Sleep:  Good   Screenings: GAD-7    Flowsheet Row Office Visit from 09/07/2021 in [facility] Clinical Support from 01/07/2021 in [facility] Clinical Support from 12/24/2020 in [facility] Clinical Support from 11/11/2020 in [facility] Clinical Support from 05/20/2020 in [facility]  Total GAD-7 Score 4 7 18 1 2       PHQ2-9    Flowsheet Row Office Visit from 09/07/2021 in [facility] Clinical Support from 08/11/2021 in [facility] Office Visit from 03/09/2021 in [facility] Clinical Support from 01/07/2021 in [facility] Clinical Support from 12/24/2020 in [facility]  PHQ-2 Total Score 4 6 0 0 3  PHQ-9 Total Score 7 22 -- 8 12      Flowsheet Row Office Visit from 09/07/2021 in [facility] Clinical Support from 08/11/2021 in  [facility] ED from 06/29/2021 in [facility]  C-SSRS RISK CATEGORY Error: Question 6 not populated Low Risk Low Risk        Assessment and Plan: Today patient endorses situational depression due to life stressors. At times he notes that his mood fluctuates. Patient notes that  he takes amitriptyline as needed. Provider recommended him taking amitriptyline daily to help manage his anxiety and depression. He will continue other medications as prescribed. Provider discussed mood stabilizers or adding an antipsychotic to help manage fluctuations in mood. He however notes that he has difficulty remembering to take medications.Patient has trailed Haldol (caused stiffness), Zyprexa (induced mania), and Invega (caused mania).   1. Bipolar I disorder, most recent episode depressed (HCC)  Continue- ARIPiprazole ER ([name] MAINTENA) 400 MG SRER injection; Inject 2 mLs (400 mg total) into the muscle every 30 (thirty) days.  Dispense: 1 each; Refill: 9 Continue- amitriptyline (ELAVIL) 100 MG  tablet; Take 1 tablet (100 mg total) by mouth at bedtime.  Dispense: 30 tablet; Refill: 3    Follow-up in 3 month for medication management Follow-up in 1 month for [name] injection Shanna Cisco, NP 09/07/2021, 12:05 PM

## 2021-09-07 NOTE — Progress Notes (Signed)
Patient presents to the for [name] Maintena 400 mg in right Deltoid given by Shikita Vaillancourt CMA, Patient will return in 28 days  

## 2021-10-07 ENCOUNTER — Encounter (HOSPITAL_COMMUNITY): Payer: Self-pay

## 2021-10-07 ENCOUNTER — Ambulatory Visit (INDEPENDENT_AMBULATORY_CARE_PROVIDER_SITE_OTHER): Payer: Medicare Other | Admitting: *Deleted

## 2021-10-07 VITALS — BP 159/112 | HR 63 | Wt 242.0 lb

## 2021-10-07 DIAGNOSIS — F3130 Bipolar disorder, current episode depressed, mild or moderate severity, unspecified: Secondary | ICD-10-CM | POA: Diagnosis not present

## 2021-10-07 MED ORDER — ARIPIPRAZOLE ER 400 MG IM PRSY
400.0000 mg | PREFILLED_SYRINGE | INTRAMUSCULAR | Status: AC
  Administered 2021-10-07 – 2022-05-26 (×8): 400 mg via INTRAMUSCULAR

## 2021-10-07 NOTE — Progress Notes (Signed)
In for scheduled abilify M 400 mg in. Last time I saw him was 2 months ago and at that time had referred him to [facility] and therapy but he did not follow up. When asked if he would like to be rescheduled he said no not at this time. He is flat but denies any issues. Writer informed him I will be retiring and he verbalized his understanding and reminded him his services here would not change and he knows the other staff. He will return in 28 days for his next shot. He continues to stay with his male friend who he acknowledges is sometimes not good for him.

## 2021-10-26 ENCOUNTER — Ambulatory Visit (INDEPENDENT_AMBULATORY_CARE_PROVIDER_SITE_OTHER): Payer: Medicare Other | Admitting: Clinical

## 2021-10-26 DIAGNOSIS — F3130 Bipolar disorder, current episode depressed, mild or moderate severity, unspecified: Secondary | ICD-10-CM | POA: Diagnosis not present

## 2021-10-30 NOTE — Progress Notes (Signed)
   THERAPIST PROGRESS NOTE  Session Time: 40 minutes  Participation Level: Active  Behavioral Response: CasualAlertEuthymic  Type of Therapy: Individual Therapy  Treatment Goals addressed: Client will cooperate with a initial psychiatric evaluation and during follow up visits once per month  ProgressTowards Goals: Progressing  Interventions: CBT and Supportive  Summary:  Chris Rogers is a 46 y.o. male who presents for the scheduled appointment oriented times five, appropriately dressed, and friendly. Client denied hallucinations and delusions. Client reported he is doing better currently. Client reported in past months he was going through a depressive episode. Client reported he is staying with his girlfriend in [place] and his mother alternatively. Client reported he missed his previous appointments because he was in jail for over a month beginning in March 2023. Client reported he was sentenced to jail for violating a 50b his ex wife out in place. Client reported he also had not been able to see his son. Client reported his wife got full custody of their son while he was in jail. Client reported he is still working on finding his own housing. Evidence of progress towards goal:  client reported he is medication compliant 7 days out of the week.    Suicidal/Homicidal: Nowithout intent/plan  Therapist Response:  Therapist began the appointment asking the client how he has been doing since last seen. Therapist used CBT to engage using active listening and positive emotional support. Therapist used CBT to give the client time to discuss his stressors over the past few months. Therapist used CBT to discuss healthy coping skills including problem solving and normalizing his emotions. Therapist used CBT ask the client to identify his progress with frequency of use with coping skills with continued practice in his daily activity.    Therapist assigned the client homework for self  care.    Plan: Client will return for psychiatry appointments. Client will be referred the [facility] office due to having private insurance  Diagnosis: bipolar 1 disorder, most recent episode depressed  Collaboration of Care: Referral or follow-up with counselor/therapist AEB client will be referred to [place] outpatient office or one of his choosing due to having private insurance.  Patient/Guardian was advised Release of Information must be obtained prior to any record release in order to collaborate their care with an outside provider. Patient/Guardian was advised if they have not already done so to contact the registration department to sign all necessary forms in order for [place] to release information regarding their care.   Consent: Patient/Guardian gives verbal consent for treatment and assignment of benefits for services provided during this visit. Patient/Guardian expressed understanding and agreed to proceed.   [place], LCSW 10/26/2021

## 2021-11-04 ENCOUNTER — Encounter (HOSPITAL_COMMUNITY): Payer: Self-pay | Admitting: Psychiatry

## 2021-11-04 ENCOUNTER — Encounter (HOSPITAL_COMMUNITY): Payer: Self-pay

## 2021-11-04 ENCOUNTER — Ambulatory Visit (HOSPITAL_COMMUNITY): Payer: Medicare Other

## 2021-11-04 ENCOUNTER — Ambulatory Visit (INDEPENDENT_AMBULATORY_CARE_PROVIDER_SITE_OTHER): Payer: Medicare Other | Admitting: Psychiatry

## 2021-11-04 VITALS — BP 158/118 | HR 93 | Resp 14 | Ht 66.0 in | Wt 241.1 lb

## 2021-11-04 DIAGNOSIS — F3110 Bipolar disorder, current episode manic without psychotic features, unspecified: Secondary | ICD-10-CM

## 2021-11-04 DIAGNOSIS — F3130 Bipolar disorder, current episode depressed, mild or moderate severity, unspecified: Secondary | ICD-10-CM

## 2021-11-04 DIAGNOSIS — F319 Bipolar disorder, unspecified: Secondary | ICD-10-CM

## 2021-11-04 MED ORDER — AMITRIPTYLINE HCL 100 MG PO TABS: 100.0000 mg | ORAL_TABLET | Freq: Every day | ORAL | 3 refills | Status: DC

## 2021-11-04 MED ORDER — ARIPIPRAZOLE ER 400 MG IM SRER
400.0000 mg | INTRAMUSCULAR | 9 refills | Status: DC
Start: 2021-11-04 — End: 2022-03-10

## 2021-11-04 NOTE — Progress Notes (Signed)
BH MD/PA/NP OP Progress Note     11/04/2021 9:36 AM Chris Rogers  MRN:  [id number]  Chief Complaint: "I am down"   HPI: 46 year old male seen today for follow up psychiatric evaluation. He has a psychiatric history of bipolar affective disorder, bipolar 1, and depression managed on amitriptyline 100 mg and [name] 400 mg q. 28 days. He notes that his medications are effective in managing his psychiatric conditions.     Today he was well-groomed, pleasant, cooperative, engaged in conversation, and maintained eye contact.  He informed Clinical research associate that he feels down.  He notes that the child who he once that was his daughter's birthday is today.  He notes that he misses her and reports that he has not seen her since Father's Day.  He also notes that he misses his son whose birthday will be coming up later this month.  He notes that his son does not want anything to do with him.  He also reports that several of his family members will not speak to him. Patient informed Clinical research associate that he is trying to cope with this.  He continues to have issues in his relationship.  At this time patient request not to do a GAD-7 and PHQ-9.  He denies SI/HI/AVH, mania, or paranoia.  Patient informed Clinical research associate that he has not taken amitriptyline in a while because he has been sleeping well.  He notes that he takes it as needed.  He also endorses having adequate appetite.    Patient request provider fill out a half fare bus service form.  Provider was agreeable to this and the form was given to the patient.  Patient reports that he is able to cope with the above stressors.  No medication changes made today.  Patient agreeable to continue medication as      Visit Diagnosis:    ICD-10-CM   1. Bipolar I disorder, most recent episode depressed (HCC)  F31.30 amitriptyline (ELAVIL) 100 MG tablet    ARIPiprazole ER ([name] MAINTENA) 400 MG SRER injection       Past Psychiatric History: Bipolar affective, MDD, Bipolar 1  Past  Medical History:  Past Medical History:  Diagnosis Date   Bipolar 1 disorder (HCC)    Diabetes mellitus, type II (HCC)    Hypertension     Past Surgical History:  Procedure Laterality Date   KNEE SURGERY      Family Psychiatric History: Father bipolar, sister depression, brother schizophrenia  Family History: History reviewed. No pertinent family history.  Social History:  Social History   Socioeconomic History   Marital status: Divorced    Spouse name: Not on file   Number of children: Not on file   Years of education: Not on file   Highest education level: Not on file  Occupational History   Not on file  Tobacco Use   Smoking status: Never   Smokeless tobacco: Never  Vaping Use   Vaping Use: Never used  Substance and Sexual Activity   Alcohol use: Not Currently   Drug use: Not Currently   Sexual activity: Not on file  Other Topics Concern   Not on file  Social History Narrative   Not on file   Social Determinants of Health   Financial Resource Strain: Not on file  Food Insecurity: Not on file  Transportation Needs: Not on file  Physical Activity: Not on file  Stress: Not on file  Social Connections: Not on file    Allergies:  Allergies  Allergen Reactions   Invega [Paliperidone] Other (See Comments)    "manic episode"   Olanzapine Other (See Comments)    Mania   Topamax [Topiramate] Other (See Comments)    Patient states "glaucoma like symptoms" and blindness    Metabolic Disorder Labs: Lab Results  Component Value Date   HGBA1C 6.3 (H) 06/29/2021   MPG 134 06/29/2021   MPG 139.85 06/22/2019   No results found for: "PROLACTIN" Lab Results  Component Value Date   CHOL 210 (H) 06/29/2021   TRIG 39 06/29/2021   HDL 60 06/29/2021   CHOLHDL 3.5 06/29/2021   VLDL 8 06/29/2021   LDLCALC 142 (H) 06/29/2021   LDLCALC 114 (H) 06/22/2019   Lab Results  Component Value Date   TSH 2.290 06/29/2021   TSH 2.897 06/22/2019    Therapeutic Level  Labs: No results found for: "LITHIUM" No results found for: "VALPROATE" No results found for: "CBMZ"  Current Medications: Current Outpatient Medications  Medication Sig Dispense Refill   amitriptyline (ELAVIL) 100 MG tablet Take 1 tablet (100 mg total) by mouth at bedtime. 30 tablet 3   ARIPiprazole ER ([name] MAINTENA) 400 MG SRER injection Inject 2 mLs (400 mg total) into the muscle every 30 (thirty) days. 1 each 9   atenolol (TENORMIN) 25 MG tablet Take 25 mg by mouth daily.     losartan (COZAAR) 100 MG tablet Take 100 mg by mouth daily.     Current Facility-Administered Medications  Medication Dose Route Frequency Provider Last Rate Last Admin   ARIPiprazole ER ([name] MAINTENA) 400 MG prefilled syringe 400 mg  400 mg Intramuscular Q28 days Rankin, Shuvon B, NP   400 mg at 10/07/21 1001     Musculoskeletal: Strength & Muscle Tone: within normal limits Gait & Station: normal Patient leans: N/A  Psychiatric Specialty Exam: Review of Systems  There were no vitals taken for this visit.There is no height or weight on file to calculate BMI.  General Appearance: Well Groomed  Eye Contact:  Good  Speech:  Clear and Coherent and Normal Rate  Volume:  Normal  Mood:  Depressed  Affect:  Appropriate and Congruent  Thought Process:  Coherent, Goal Directed and Linear  Orientation:  Full (Time, Place, and Person)  Thought Content: WDL and Logical   Suicidal Thoughts:  No  Homicidal Thoughts:  No,   Memory:  Immediate;   Good Recent;   Good Remote;   Good  Judgement:  Good  Insight:  Good  Psychomotor Activity:  Normal  Concentration:  Concentration: Good and Attention Span: Good  Recall:  Good  Fund of Knowledge: Good  Language: Good  Akathisia:  No  Handed:  Right  AIMS (if indicated): Not done  Assets:  Communication Skills Desire for Improvement Financial Resources/Insurance Housing Social Support  ADL's:  Intact  Cognition: WNL  Sleep:  Good    Screenings: GAD-7    Flowsheet Row Office Visit from 09/07/2021 in [facility] Clinical Support from 01/07/2021 in [facility] Clinical Support from 12/24/2020 in [facility] Clinical Support from 11/11/2020 in [facility] Clinical Support from 05/20/2020 in [facility]  Total GAD-7 Score 4 7 18 1 2       PHQ2-9    [place] Office Visit from 09/07/2021 in [place] from 08/11/2021 in [facility] Office Visit from 03/09/2021 in [facility] Clinical Support from 01/07/2021 in [facility] Clinical Support from 12/24/2020 in [facility]  PHQ-2 Total Score 4 6 0 0 3  PHQ-9 Total Score 7 22 -- 8 12      Flowsheet Row Office Visit from 09/07/2021 in [facility] Clinical Support from 08/11/2021 in [facility] ED from 06/29/2021 in [facility]  C-SSRS RISK CATEGORY Error: Question 6 not populated Low Risk Low Risk        Assessment and Plan: Today patient endorses situational depression due to life stressors. At this time he request that medications not be adjusted.  1. Bipolar I disorder, most recent episode depressed (HCC)  Continue- ARIPiprazole ER ([name] MAINTENA) 400 MG SRER injection; Inject 2 mLs (400 mg total) into the muscle every 30 (thirty) days.  Dispense: 1 each; Refill: 9 Continue- amitriptyline (ELAVIL) 100 MG tablet; Take 1 tablet (100 mg total) by mouth at bedtime.  Dispense: 30 tablet; Refill: 3    Follow-up in 3 month for medication management Follow-up in 1 month for [name] injection Shanna Cisco, NP 11/04/2021, 9:36 AM

## 2021-11-04 NOTE — Progress Notes (Signed)
Patient presents to the for [name] Maintena 400 mg in right Deltoid given by Griffith Citron CMA, Patient will return in 28 days

## 2021-11-05 NOTE — Plan of Care (Signed)
  Problem: Bipolar Disorder CCP Problem  1  Goal: STG: Jani WILL COOPERATE WITH A INITIAL PSYCHIATRIC EVALUATION AND DURING ALL FOLLOW-UP VISITS ONCE PER MONTH Outcome: Progressing

## 2021-12-01 ENCOUNTER — Ambulatory Visit (HOSPITAL_COMMUNITY): Payer: Medicare Other | Admitting: Psychiatry

## 2021-12-09 ENCOUNTER — Ambulatory Visit (INDEPENDENT_AMBULATORY_CARE_PROVIDER_SITE_OTHER): Payer: Medicare Other | Admitting: Psychiatry

## 2021-12-09 ENCOUNTER — Ambulatory Visit (HOSPITAL_COMMUNITY): Payer: Medicare Other

## 2021-12-09 ENCOUNTER — Encounter (HOSPITAL_COMMUNITY): Payer: Self-pay

## 2021-12-09 VITALS — BP 173/125 | HR 100 | Ht 66.0 in | Wt 242.0 lb

## 2021-12-09 DIAGNOSIS — F3110 Bipolar disorder, current episode manic without psychotic features, unspecified: Secondary | ICD-10-CM

## 2021-12-09 DIAGNOSIS — F3130 Bipolar disorder, current episode depressed, mild or moderate severity, unspecified: Secondary | ICD-10-CM | POA: Diagnosis not present

## 2021-12-09 DIAGNOSIS — F319 Bipolar disorder, unspecified: Secondary | ICD-10-CM

## 2021-12-09 DIAGNOSIS — I10 Essential (primary) hypertension: Secondary | ICD-10-CM | POA: Diagnosis not present

## 2021-12-09 NOTE — Progress Notes (Signed)
Patient presents to the for [name] Maintena 400 mg in right Deltoid given by Antony Salmon CMA, Patient will return in 28 days     Pt was sent to ED by EMS due to elevated BP was taken manually

## 2021-12-09 NOTE — Progress Notes (Signed)
BH MD/PA/NP OP Progress Note     12/09/2021 10:34 AM Chris Rogers  MRN:  [id number]  Chief Complaint: "I have been having problems seeing"   HPI: 46 year old male seen today for follow up psychiatric evaluation. He has a psychiatric history of bipolar affective disorder, bipolar 1, and depression managed on amitriptyline 100 mg and [name] 400 mg q. 28 days. He notes that his medications are effective in managing his psychiatric conditions.  He however notes that physically he is not feeling well.  Today he was well-groomed, pleasant, cooperative, engaged in conversation, and maintained eye contact.  Patient was alert and oriented x 4.  He informed Clinical research associate that he has been having problems seeing and notes that at times he feels dizzy.  Patient's blood pressure is elevated today at 173/125.  Nursing staff took blood pressure multiple times with similar readings.  Patient informed writer that he misplaced his Atenolol and has not been taking it.  He did note that he takes his Losartan regularly.  Provider and nursing staff assisted patient in calling his primary care doctor for an emergency visit.  Patient's PCP did not have availability today and recommended that he goes to the ED or urgent care.  Patient requested to go to the ED.  911 called to transfer patient.    During visit he informed writer that at this time of year is specifically hard for him.  He notes that he lost his child around this time last year.  He also notes that his father's death anniversary will be approaching in January.  Patient notes that he is sleeping well.  He did endorse having increased energy but denies other symptoms of mania today.  He reports that his anxiety and depression are well-managed.  Today he denies SI/HI/AVH or paranoia.  No medication changes made today.  Patient agreeable to continue medication as prescribed.  At this time patient is not in need of refills.       Visit Diagnosis:    ICD-10-CM   1.  Bipolar I disorder, most recent episode depressed (HCC)  F31.30     2. Hypertension, unspecified type  I10        Past Psychiatric History: Bipolar affective, MDD, Bipolar 1  Past Medical History:  Past Medical History:  Diagnosis Date   Bipolar 1 disorder (HCC)    Diabetes mellitus, type II (HCC)    Hypertension     Past Surgical History:  Procedure Laterality Date   KNEE SURGERY      Family Psychiatric History: Father bipolar, sister depression, brother schizophrenia  Family History: No family history on file.  Social History:  Social History   Socioeconomic History   Marital status: Divorced    Spouse name: Not on file   Number of children: Not on file   Years of education: Not on file   Highest education level: Not on file  Occupational History   Not on file  Tobacco Use   Smoking status: Never   Smokeless tobacco: Never  Vaping Use   Vaping Use: Never used  Substance and Sexual Activity   Alcohol use: Not Currently   Drug use: Not Currently   Sexual activity: Not on file  Other Topics Concern   Not on file  Social History Narrative   Not on file   Social Determinants of Health   Financial Resource Strain: Not on file  Food Insecurity: Not on file  Transportation Needs: Not on file  Physical Activity:  Not on file  Stress: Not on file  Social Connections: Not on file    Allergies:  Allergies  Allergen Reactions   Invega [Paliperidone] Other (See Comments)    "manic episode"   Olanzapine Other (See Comments)    Mania   Topamax [Topiramate] Other (See Comments)    Patient states "glaucoma like symptoms" and blindness    Metabolic Disorder Labs: Lab Results  Component Value Date   HGBA1C 6.3 (H) 06/29/2021   MPG 134 06/29/2021   MPG 139.85 06/22/2019   No results found for: "PROLACTIN" Lab Results  Component Value Date   CHOL 210 (H) 06/29/2021   TRIG 39 06/29/2021   HDL 60 06/29/2021   CHOLHDL 3.5 06/29/2021   VLDL 8 06/29/2021    LDLCALC 142 (H) 06/29/2021   LDLCALC 114 (H) 06/22/2019   Lab Results  Component Value Date   TSH 2.290 06/29/2021   TSH 2.897 06/22/2019    Therapeutic Level Labs: No results found for: "LITHIUM" No results found for: "VALPROATE" No results found for: "CBMZ"  Current Medications: Current Outpatient Medications  Medication Sig Dispense Refill   amitriptyline (ELAVIL) 100 MG tablet Take 1 tablet (100 mg total) by mouth at bedtime. 30 tablet 3   ARIPiprazole ER ([name] MAINTENA) 400 MG SRER injection Inject 2 mLs (400 mg total) into the muscle every 30 (thirty) days. 1 each 9   atenolol (TENORMIN) 25 MG tablet Take 25 mg by mouth daily.     losartan (COZAAR) 100 MG tablet Take 100 mg by mouth daily.     Current Facility-Administered Medications  Medication Dose Route Frequency Provider Last Rate Last Admin   ARIPiprazole ER ([name] MAINTENA) 400 MG prefilled syringe 400 mg  400 mg Intramuscular Q28 days Rankin, Shuvon B, NP   400 mg at 12/09/21 1021     Musculoskeletal: Strength & Muscle Tone: within normal limits Gait & Station: normal Patient leans: N/A  Psychiatric Specialty Exam: Review of Systems  There were no vitals taken for this visit.There is no height or weight on file to calculate BMI.  General Appearance: Well Groomed  Eye Contact:  Good  Speech:  Clear and Coherent and Normal Rate  Volume:  Normal  Mood:  Euthymic  Affect:  Appropriate and Congruent  Thought Process:  Coherent, Goal Directed and Linear  Orientation:  Full (Time, Place, and Person)  Thought Content: WDL and Logical   Suicidal Thoughts:  No  Homicidal Thoughts:  No,   Memory:  Immediate;   Good Recent;   Good Remote;   Good  Judgement:  Good  Insight:  Good  Psychomotor Activity:  Normal  Concentration:  Concentration: Good and Attention Span: Good  Recall:  Good  Fund of Knowledge: Good  Language: Good  Akathisia:  No  Handed:  Right  AIMS (if indicated): Not done  Assets:   Communication Skills Desire for Improvement Financial Resources/Insurance Housing Social Support  ADL's:  Intact  Cognition: WNL  Sleep:  Good   Screenings: GAD-7    Flowsheet Row Office Visit from 09/07/2021 in [facility] Clinical Support from 01/07/2021 in [facility] Clinical Support from 12/24/2020 in [facility] Clinical Support from 11/11/2020 [facility] Support from 05/20/2020 in [facility]  Total GAD-7 Score 4 7 18 1 2       PHQ2-9    [place] Office Visit from 09/07/2021 in [facility] Clinical Support from 08/11/2021 in [facility] Office Visit from 03/09/2021 in [place] from 01/07/2021 in [place] from 12/24/2020 in [facility]  PHQ-2 Total Score 4 6 0 0 3  PHQ-9 Total Score 7 22 -- 8 12      [place] Office Visit from 09/07/2021 in [facility] from 08/11/2021 in [facility] ED from 06/29/2021 in [place] Error: Question 6 not populated Low Risk Low Risk        Assessment and Plan: Patient complained of being dizzy and having issues seen.  Patient's blood pressure elevated at 173/125.  Provider and nursing staff assisted patient in calling his PCP who recommended that he be seen in the ED or urgent care.  Patient preferred being seen in the ED.  911 called to transfer patient.  Mentally he endorses some situational depression but reports that he is able to cope with it.  No medication changes made today.  Patient agreeable to continue medication as prescribed.  At this time patient is not in need of refills.  1.  Bipolar I disorder, most recent episode depressed ([place])   2. Hypertension, unspecified type      Follow-up in 3 month for medication management Follow-up in 1 month for [name] injection Salley Slaughter, NP 12/09/2021, 10:34 AM

## 2022-01-27 ENCOUNTER — Encounter (HOSPITAL_COMMUNITY): Payer: Self-pay

## 2022-01-27 ENCOUNTER — Ambulatory Visit (HOSPITAL_COMMUNITY): Payer: Medicare Other

## 2022-01-27 VITALS — BP 146/102 | HR 63 | Ht 66.0 in | Wt 242.0 lb

## 2022-01-27 DIAGNOSIS — F3130 Bipolar disorder, current episode depressed, mild or moderate severity, unspecified: Secondary | ICD-10-CM

## 2022-01-27 DIAGNOSIS — I10 Essential (primary) hypertension: Secondary | ICD-10-CM

## 2022-01-27 DIAGNOSIS — F3110 Bipolar disorder, current episode manic without psychotic features, unspecified: Secondary | ICD-10-CM

## 2022-01-27 DIAGNOSIS — F319 Bipolar disorder, unspecified: Secondary | ICD-10-CM

## 2022-01-27 NOTE — Progress Notes (Signed)
Patient presents to the for [name] Maintena 400 mg in Left Deltoid given by Griffith Citron CMA, Patient will return in 28 days

## 2022-02-24 ENCOUNTER — Telehealth: Payer: Self-pay | Admitting: Hematology and Oncology

## 2022-02-24 ENCOUNTER — Ambulatory Visit (INDEPENDENT_AMBULATORY_CARE_PROVIDER_SITE_OTHER): Payer: Medicare Other | Admitting: *Deleted

## 2022-02-24 ENCOUNTER — Encounter (HOSPITAL_COMMUNITY): Payer: Self-pay

## 2022-02-24 VITALS — BP 154/112 | HR 77 | Ht 66.0 in | Wt 240.0 lb

## 2022-02-24 DIAGNOSIS — F319 Bipolar disorder, unspecified: Secondary | ICD-10-CM

## 2022-02-24 NOTE — Progress Notes (Signed)
In as scheduled for his monthly inj of [name] M 400 mg. He got his shot today in his R DELTOID without issue. He has a good appearance and a flexible affect. He offers no complaints. States he is very glad to see me, I am working prn only. He is looking at an apartment today. He is still with his girlfriend but not living with her, can't stop caring for her but he is living with his mom and reports he is doing OK. He is to return in 28 days for his next shot.

## 2022-02-24 NOTE — Telephone Encounter (Signed)
Scheduled appt per 2/22 referral. Pt is aware of appt date and time. Pt is aware to arrive 15 mins prior to appt time and to bring and updated insurance card. Pt is aware of appt location.

## 2022-03-10 ENCOUNTER — Ambulatory Visit (INDEPENDENT_AMBULATORY_CARE_PROVIDER_SITE_OTHER): Payer: Medicare Other | Admitting: Psychiatry

## 2022-03-10 ENCOUNTER — Encounter (HOSPITAL_COMMUNITY): Payer: Self-pay | Admitting: Psychiatry

## 2022-03-10 ENCOUNTER — Other Ambulatory Visit (HOSPITAL_COMMUNITY): Payer: Self-pay

## 2022-03-10 VITALS — BP 150/101 | HR 92 | Wt 242.6 lb

## 2022-03-10 DIAGNOSIS — Z726 Gambling and betting: Secondary | ICD-10-CM

## 2022-03-10 DIAGNOSIS — F3130 Bipolar disorder, current episode depressed, mild or moderate severity, unspecified: Secondary | ICD-10-CM | POA: Diagnosis not present

## 2022-03-10 MED ORDER — ARIPIPRAZOLE ER 400 MG IM SRER
400.0000 mg | INTRAMUSCULAR | 9 refills | Status: DC
Start: 2022-03-10 — End: 2022-05-26

## 2022-03-10 MED ORDER — AMITRIPTYLINE HCL 100 MG PO TABS: 100.0000 mg | ORAL_TABLET | Freq: Every day | ORAL | 3 refills | Status: DC

## 2022-03-10 NOTE — Progress Notes (Signed)
BH MD/PA/NP OP Progress Note     03/10/2022 9:20 AM Deforest Floerke  MRN:  [id number]  Chief Complaint: "I have been stressed because of gambling"   HPI: 47 year old male seen today for follow up psychiatric evaluation. He has a psychiatric history of bipolar affective disorder, bipolar 1, and depression managed on amitriptyline 100 mg and [name] 400 mg q. 28 days. He notes that his medications are somewhat effective in managing his psychiatric conditions.    Today he was well-groomed, pleasant, cooperative, engaged in conversation, and maintained eye contact.  Patient informed Probation officer that he has been stressed out because of his gambling.  He reports that he has been gambling excessively for the last year.  He notes that he has spent his entire disability check and is now concerned about finances.  He notes that his family members have been supporting him financially.  Patient reports that his mood continues to fluctuate, he is irritable, distractible, has racing thoughts.  He notes that most days he feels depressed.  Provider offered patient a mood stabilizer or adjustment in his antidepressants however he was not agreeable.  Patient requested not to do a GAD-7 or PHQ-9 today.   Today he denies SI/HI/VAH or paranoia.  He endorses adequate sleep and appetite.   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   Today patient given resources for gambling.  At this time he request that his medications not be adjusted.  Provider discussed starting a mood stabilizer such as Depakote Tegretol at next visit if his depression and mood does not improve.  He endorsed understanding and agreed.  No other concerns noted at this time.        Visit Diagnosis:    ICD-10-CM   1. Gambling  Z72.6     2. Bipolar I disorder, most recent episode depressed (HCC)  F31.30 amitriptyline (ELAVIL) 100 MG tablet    ARIPiprazole ER ([name] MAINTENA) 400 MG SRER injection        Past Psychiatric History: Bipolar affective, MDD, Bipolar 1  Past Medical History:  Past Medical History:  Diagnosis Date   Bipolar 1 disorder ([place])    Diabetes mellitus, type II ([place])    Hypertension     Past Surgical History:  Procedure Laterality Date   KNEE SURGERY      Family Psychiatric History: Father bipolar, sister depression, brother schizophrenia  Family History: History reviewed. No pertinent family history.  Social History:  Social History   Socioeconomic History   Marital status: Divorced    Spouse name: Not on file   Number of children: Not on file   Years of education: Not on file   Highest education level: Not on file  Occupational History   Not on file  Tobacco Use   Smoking status: Never   Smokeless tobacco: Never  Vaping Use   Vaping Use: Never used  Substance and Sexual Activity   Alcohol use: Not Currently   Drug use: Not Currently   Sexual activity: Not on file  Other Topics Concern   Not on file  Social History Narrative   Not on file   Social Determinants of Health   Financial Resource Strain: Not on file  Food Insecurity: Not on file  Transportation Needs: Not on file  Physical Activity: Not on file  Stress: Not on file  Social Connections: Not on file    Allergies:  Allergies  Allergen Reactions   Invega [Paliperidone] Other (  See Comments)    "manic episode"   Olanzapine Other (See Comments)    Mania   Topamax [Topiramate] Other (See Comments)    Patient states "glaucoma like symptoms" and blindness    Metabolic Disorder Labs: Lab Results  Component Value Date   HGBA1C 6.3 (H) 06/29/2021   MPG 134 06/29/2021   MPG 139.85 06/22/2019   No results found for: "PROLACTIN" Lab Results  Component Value Date   CHOL 210 (H) 06/29/2021   TRIG 39 06/29/2021   HDL 60 06/29/2021   CHOLHDL 3.5 06/29/2021   VLDL 8 06/29/2021   LDLCALC 142 (H) 06/29/2021   LDLCALC 114 (H) 06/22/2019   Lab Results  Component Value  Date   TSH 2.290 06/29/2021   TSH 2.897 06/22/2019    Therapeutic Level Labs: No results found for: "LITHIUM" No results found for: "VALPROATE" No results found for: "CBMZ"  Current Medications: Current Outpatient Medications  Medication Sig Dispense Refill   amitriptyline (ELAVIL) 100 MG tablet Take 1 tablet (100 mg total) by mouth at bedtime. 30 tablet 3   ARIPiprazole ER ([name] MAINTENA) 400 MG SRER injection Inject 2 mLs (400 mg total) into the muscle every 30 (thirty) days. 1 each 9   atenolol (TENORMIN) 25 MG tablet Take 25 mg by mouth daily.     losartan (COZAAR) 100 MG tablet Take 100 mg by mouth daily.     Current Facility-Administered Medications  Medication Dose Route Frequency Provider Last Rate Last Admin   ARIPiprazole ER ([name] MAINTENA) 400 MG prefilled syringe 400 mg  400 mg Intramuscular Q28 days Rankin, Shuvon B, NP   400 mg at 02/24/22 1328     Musculoskeletal: Strength & Muscle Tone: within normal limits Gait & Station: normal Patient leans: N/A  Psychiatric Specialty Exam: Review of Systems  Blood pressure (!) 150/101, pulse 92, weight 242 lb 9.6 oz (110 kg), SpO2 100 %.Body mass index is 39.16 kg/m.  General Appearance: Well Groomed  Eye Contact:  Good  Speech:  Clear and Coherent and Normal Rate  Volume:  Normal  Mood:  Depressed  Affect:  Appropriate and Congruent  Thought Process:  Coherent, Goal Directed and Linear  Orientation:  Full (Time, Place, and Person)  Thought Content: WDL and Logical   Suicidal Thoughts:  No  Homicidal Thoughts:  No,   Memory:  Immediate;   Good Recent;   Good Remote;   Good  Judgement:  Good  Insight:  Good  Psychomotor Activity:  Normal  Concentration:  Concentration: Good and Attention Span: Good  Recall:  Good  Fund of Knowledge: Good  Language: Good  Akathisia:  No  Handed:  Right  AIMS (if indicated): Not done  Assets:  Communication Skills Desire for Improvement Financial  Resources/Insurance Housing Social Support  ADL's:  Intact  Cognition: WNL  Sleep:  Good   Screenings: GAD-7    Flowsheet Row Office Visit from 09/07/2021 in [facility] Clinical Support from 01/07/2021 in [facility] Clinical Support from 12/24/2020 in [facility] Clinical Support from 11/11/2020 in [facility] Clinical Support from 05/20/2020 in [facility]  Total GAD-7 Score '4 7 18 1 2      '$ PHQ2-9    [place] Office Visit from 09/07/2021 in [place] from 08/11/2021 in [facility] Office Visit from 03/09/2021 in [facility] Clinical Support from  01/07/2021 in [place] from 12/24/2020 in [facility]  PHQ-2 Total Score 4 6 0 0 3  PHQ-9 Total Score 7 22 -- 8 12      [place] Office Visit from 09/07/2021 in [place] from 08/11/2021 in [facility] ED from 06/29/2021 in [place] Error: Question 6 not populated Low Risk Low Risk        Assessment and Plan: Patient reports that he has been impulsively gambling for the last year.  He also endorses symptoms of depression.  At this time he request that medications not be adjusted.  Patient given resources for gambling.  No medication changes made today.  Patient agreeable to continue medications as prescribed.  1. Bipolar I disorder, most recent episode depressed (HCC)  Continue- amitriptyline (ELAVIL) 100 MG tablet; Take 1 tablet (100 mg total) by mouth at bedtime.  Dispense: 30 tablet; Refill: 3 Continue- ARIPiprazole ER ([name] MAINTENA) 400 MG SRER injection; Inject 2 mLs (400 mg total) into  the muscle every 30 (thirty) days.  Dispense: 1 each; Refill: 9  2. Gambling          Follow-up in 3 month for medication management Follow-up in 1 month for [name] injection Salley Slaughter, NP 03/10/2022, 9:20 AM

## 2022-03-14 ENCOUNTER — Inpatient Hospital Stay: Payer: Medicare Other | Attending: Hematology and Oncology | Admitting: Hematology and Oncology

## 2022-03-14 ENCOUNTER — Inpatient Hospital Stay: Payer: Medicare Other

## 2022-03-24 ENCOUNTER — Encounter (HOSPITAL_COMMUNITY): Payer: Self-pay

## 2022-03-24 ENCOUNTER — Ambulatory Visit (INDEPENDENT_AMBULATORY_CARE_PROVIDER_SITE_OTHER): Payer: Medicare Other

## 2022-03-24 VITALS — BP 169/114 | HR 79 | Resp 100 | Ht 66.0 in | Wt 253.0 lb

## 2022-03-24 DIAGNOSIS — F319 Bipolar disorder, unspecified: Secondary | ICD-10-CM

## 2022-03-24 DIAGNOSIS — F3130 Bipolar disorder, current episode depressed, mild or moderate severity, unspecified: Secondary | ICD-10-CM

## 2022-03-24 DIAGNOSIS — I10 Essential (primary) hypertension: Secondary | ICD-10-CM

## 2022-03-24 DIAGNOSIS — F3110 Bipolar disorder, current episode manic without psychotic features, unspecified: Secondary | ICD-10-CM

## 2022-03-24 NOTE — Progress Notes (Signed)
Patient presents to the for [name] Maintena 400 mg in Left Deltoid given by Griffith Citron CMA, Patient will return in 28 days

## 2022-04-11 ENCOUNTER — Other Ambulatory Visit: Payer: Self-pay

## 2022-04-11 ENCOUNTER — Inpatient Hospital Stay: Payer: Medicare Other

## 2022-04-11 ENCOUNTER — Inpatient Hospital Stay: Payer: Medicare Other | Attending: Hematology and Oncology | Admitting: Hematology and Oncology

## 2022-04-11 ENCOUNTER — Encounter: Payer: Self-pay | Admitting: Hematology and Oncology

## 2022-04-11 VITALS — BP 158/101 | HR 76 | Temp 97.4°F | Resp 18 | Ht 66.0 in | Wt 252.4 lb

## 2022-04-11 DIAGNOSIS — M4317 Spondylolisthesis, lumbosacral region: Secondary | ICD-10-CM | POA: Insufficient documentation

## 2022-04-11 DIAGNOSIS — Z79899 Other long term (current) drug therapy: Secondary | ICD-10-CM | POA: Insufficient documentation

## 2022-04-11 DIAGNOSIS — E119 Type 2 diabetes mellitus without complications: Secondary | ICD-10-CM | POA: Diagnosis not present

## 2022-04-11 DIAGNOSIS — F319 Bipolar disorder, unspecified: Secondary | ICD-10-CM | POA: Diagnosis not present

## 2022-04-11 DIAGNOSIS — F331 Major depressive disorder, recurrent, moderate: Secondary | ICD-10-CM | POA: Diagnosis not present

## 2022-04-11 DIAGNOSIS — I10 Essential (primary) hypertension: Secondary | ICD-10-CM | POA: Diagnosis not present

## 2022-04-11 DIAGNOSIS — C8408 Mycosis fungoides, lymph nodes of multiple sites: Secondary | ICD-10-CM

## 2022-04-11 DIAGNOSIS — N400 Enlarged prostate without lower urinary tract symptoms: Secondary | ICD-10-CM | POA: Diagnosis not present

## 2022-04-11 DIAGNOSIS — R591 Generalized enlarged lymph nodes: Secondary | ICD-10-CM | POA: Insufficient documentation

## 2022-04-11 LAB — CMP (CANCER CENTER ONLY)
ALT: 24 U/L (ref 0–44)
AST: 19 U/L (ref 15–41)
Albumin: 4 g/dL (ref 3.5–5.0)
Alkaline Phosphatase: 57 U/L (ref 38–126)
Anion gap: 6 (ref 5–15)
BUN: 7 mg/dL (ref 6–20)
CO2: 28 mmol/L (ref 22–32)
Calcium: 9.3 mg/dL (ref 8.9–10.3)
Chloride: 106 mmol/L (ref 98–111)
Creatinine: 1.09 mg/dL (ref 0.61–1.24)
GFR, Estimated: >60 mL/min (ref >60)
Glucose, Bld: 105 mg/dL — ABNORMAL HIGH (ref 70–99)
Potassium: 4 mmol/L (ref 3.5–5.1)
Sodium: 140 mmol/L (ref 135–145)
Total Bilirubin: 0.7 mg/dL (ref 0.3–1.2)
Total Protein: 6.3 g/dL — ABNORMAL LOW (ref 6.5–8.1)

## 2022-04-11 LAB — CBC WITH DIFFERENTIAL (CANCER CENTER ONLY)
Abs Immature Granulocytes: 0.02 10*3/uL (ref 0.00–0.07)
Basophils Absolute: 0 10*3/uL (ref 0.0–0.1)
Basophils Relative: 1 %
Eosinophils Absolute: 0.1 10*3/uL (ref 0.0–0.5)
Eosinophils Relative: 3 %
HCT: 43.4 % (ref 39.0–52.0)
Hemoglobin: 14.4 g/dL (ref 13.0–17.0)
Immature Granulocytes: 0 %
Lymphocytes Relative: 29 %
Lymphs Abs: 1.4 10*3/uL (ref 0.7–4.0)
MCH: 27.2 pg (ref 26.0–34.0)
MCHC: 33.2 g/dL (ref 30.0–36.0)
MCV: 82 fL (ref 80.0–100.0)
Monocytes Absolute: 0.4 10*3/uL (ref 0.1–1.0)
Monocytes Relative: 8 %
Neutro Abs: 2.8 10*3/uL (ref 1.7–7.7)
Neutrophils Relative %: 59 %
Platelet Count: 223 10*3/uL (ref 150–400)
RBC: 5.29 MIL/uL (ref 4.22–5.81)
RDW: 13.3 % (ref 11.5–15.5)
WBC Count: 4.9 10*3/uL (ref 4.0–10.5)
nRBC: 0 % (ref 0.0–0.2)

## 2022-04-11 LAB — LACTATE DEHYDROGENASE: LDH: 131 U/L (ref 98–192)

## 2022-04-11 NOTE — Assessment & Plan Note (Signed)
I was not able to get pathology report that come from his diagnosis He does not remember much of his diagnosis I was able to pull records from dermatologist that he saw in [name] The patient had received PUVA treatment in the past; he does not find it particularly helpful I will order blood work and additional workup I will order a PET CT imaging for staging; he has clinically palpable lymphadenopathy on exam I will see him again in 3 weeks for further follow-up

## 2022-04-11 NOTE — Progress Notes (Signed)
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 I was not able to get pathology report that come from his diagnosis He does not remember much of his diagnosis I was able to pull records from dermatologist that he saw in [name] The patient had received PUVA treatment in the past; he does not find it particularly helpful I will order blood work and additional workup I will order a PET CT imaging for staging; he has clinically palpable lymphadenopathy on exam I will see him again in 3 weeks for further follow-up   Major depressive disorder, recurrent episode, moderate (HCC) The patient is on multiple different medications He is seeing a counselor for this  Hypertension His blood pressure is very high I encouraged the patient to take his medications as prescribed  Orders Placed This Encounter  Procedures   NM PET Image Initial (PI) Skull Base To Thigh (F-18 FDG)    Standing Status:   Future    Standing Expiration Date:   04/11/2023    Order Specific Question:   If indicated for the ordered procedure, I authorize the administration of a radiopharmaceutical per Radiology protocol    Answer:   Yes    Order Specific Question:   Preferred imaging location?    Answer:   [place]   CBC with Differential (Cancer Center Only)    Standing Status:   Future    Number of Occurrences:   1    Standing Expiration Date:   04/11/2023   CMP (Cancer Center only)    Standing Status:   Future    Number of Occurrences:   1    Standing Expiration Date:   04/11/2023   Lactate dehydrogenase    Standing Status:   Future    Number of Occurrences:   1    Standing Expiration Date:   04/11/2023   Miscellaneous test (send-out)    Standing Status:   Future    Number of Occurrences:   1    Standing Expiration Date:   04/11/2023    Order Specific Question:   Test name / description:    Answer:    T cell rearrangement    The total time spent in the appointment was 60 minutes encounter with patients including review of chart and various tests results, discussions about plan of care and coordination of care plan   All questions were answered. The patient knows to call the clinic with any problems, questions or concerns. No barriers to learning was detected.  Artis Delay, MD 4/8/202410:47 AM  CHIEF COMPLAINTS/PURPOSE OF CONSULTATION:  Mycosis fungoides  HISTORY OF PRESENTING ILLNESS:  Chris Rogers 47 y.o. male is here because of previous diagnosis of mycosis fungoides. The patient is a poor historian He is here by himself He felt that he might have been diagnosed for over 10 years He is being followed by her dermatologist in [place] and was prescribed "light" treatment He does not think they helped.  He denies skin itching or discomfort Subsequently, he moved to [place] and was seen by a dermatologist once.  Subsequently, he moved back to [place].  He denies worsening skin rashes.  No new lymphadenopathy.  Denies abnormal weight loss or night sweats. He is not working.  He is on disability due to bipolar disorder  I have reviewed his chart and materials related to his cancer extensively and collaborated history  with the patient. 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   04/11/2022 Cancer Staging   Staging form: Mycosis Fungoides and Sezary Syndrome, AJCC 8th Edition - Clinical: cT4, cN1, cM0 - Signed by Artis Delay, MD on 04/11/2022 Stage prefix: Initial diagnosis     MEDICAL HISTORY:  Past Medical History:  Diagnosis Date   Bipolar 1 disorder    Diabetes mellitus, type II    Hypertension     SURGICAL HISTORY: Past Surgical History:  Procedure Laterality Date   KNEE SURGERY      SOCIAL  HISTORY: Social History   Socioeconomic History   Marital status: Divorced    Spouse name: Not on file   Number of children: 1   Years of education: Not on file   Highest education level: Not on file  Occupational History   Not on file  Tobacco Use   Smoking status: Never   Smokeless tobacco: Never  Vaping Use   Vaping Use: Never used  Substance and Sexual Activity   Alcohol use: Not Currently   Drug use: Not Currently   Sexual activity: Not on file  Other Topics Concern   Not on file  Social History Narrative   Not on file   Social Determinants of Health   Financial Resource Strain: Not on file  Food Insecurity: Not on file  Transportation Needs: Not on file  Physical Activity: Not on file  Stress: Not on file  Social Connections: Not on file  Intimate Partner Violence: Not on file    FAMILY HISTORY: Family History  Problem Relation Age of Onset   Hypertension Mother    Hyperlipidemia Mother     ALLERGIES:  is allergic to invega [paliperidone], olanzapine, and topamax [topiramate].  MEDICATIONS:  Current Outpatient Medications  Medication Sig Dispense Refill   amLODipine (NORVASC) 10 MG tablet Take 10 mg by mouth daily.     amitriptyline (ELAVIL) 100 MG tablet Take 1 tablet (100 mg total) by mouth at bedtime. 30 tablet 3   ARIPiprazole ER ([name] MAINTENA) 400 MG SRER injection Inject 2 mLs (400 mg total) into the muscle every 30 (thirty) days. 1 each 9   atenolol (TENORMIN) 25 MG tablet Take 25 mg by mouth daily.     losartan (COZAAR) 100 MG tablet Take 100 mg by mouth daily.     Current Facility-Administered Medications  Medication Dose Route Frequency Provider Last Rate Last Admin   ARIPiprazole ER ([name] MAINTENA) 400 MG prefilled syringe 400 mg  400 mg Intramuscular Q28 days Rankin, Shuvon B, NP   400 mg at 03/24/22 0827    REVIEW OF SYSTEMS:   Constitutional: Denies fevers, chills or abnormal night sweats Eyes: Denies blurriness of vision, double  vision or watery eyes Ears, nose, mouth, throat, and face: Denies mucositis or sore throat Respiratory: Denies cough, dyspnea or wheezes Cardiovascular: Denies palpitation, chest discomfort or lower extremity swelling Gastrointestinal:  Denies nausea, heartburn or change in bowel habits Skin: Denies abnormal skin rashes Lymphatics: Denies new lymphadenopathy or easy bruising Neurological:Denies numbness, tingling or new weaknesses Behavioral/Psych: Mood is stable, no new changes  All other systems were reviewed with the patient and are negative.  PHYSICAL EXAMINATION: ECOG PERFORMANCE STATUS: 0 - Asymptomatic  Vitals:   04/11/22 1005  BP: (!) 158/101  Pulse: 76  Resp: 18  Temp: (!) 97.4 F (36.3  C)  SpO2: 100%   Filed Weights   04/11/22 1005  Weight: 252 lb 6.4 oz (114.5 kg)    GENERAL:alert, no distress and comfortable SKIN: He has diffuse skin plaques throughout his body EYES: normal, conjunctiva are pink and non-injected, sclera clear OROPHARYNX:no exudate, no erythema and lips, buccal mucosa, and tongue normal  NECK: supple, thyroid normal size, non-tender, without nodularity LYMPH: He has palpable lymphadenopathy on the right axilla  LUNGS: clear to auscultation and percussion with normal breathing effort HEART: regular rate & rhythm and no murmurs and no lower extremity edema ABDOMEN:abdomen soft, non-tender and normal bowel sounds Musculoskeletal:no cyanosis of digits and no clubbing  PSYCH: alert & oriented x 3 with fluent speech NEURO: no focal motor/sensory deficits  LABORATORY DATA:  I have reviewed the data as listed Lab Results  Component Value Date   WBC 4.9 04/11/2022   HGB 14.4 04/11/2022   HCT 43.4 04/11/2022   MCV 82.0 04/11/2022   PLT 223 04/11/2022   Recent Labs    06/29/21 1049  NA 140  K 4.4  CL 105  CO2 26  GLUCOSE 96  BUN 9  CREATININE 1.09  CALCIUM 10.0  GFRNONAA >60  PROT 7.8  ALBUMIN 4.6  AST 21  ALT 20  ALKPHOS 80   BILITOT 1.6*

## 2022-04-11 NOTE — Assessment & Plan Note (Signed)
The patient is on multiple different medications He is seeing a counselor for this

## 2022-04-11 NOTE — Assessment & Plan Note (Signed)
His blood pressure is very high I encouraged the patient to take his medications as prescribed

## 2022-04-12 ENCOUNTER — Telehealth: Payer: Self-pay

## 2022-04-12 NOTE — Telephone Encounter (Signed)
-----   Message from Artis Delay, MD sent at 04/12/2022  3:33 PM EDT ----- He has not called to schedule PET yet Please remind him to call

## 2022-04-12 NOTE — Telephone Encounter (Signed)
Called and left a message asking him to call radiology scheduling to schedule PET, left phone # to call and schedule.

## 2022-04-15 ENCOUNTER — Telehealth: Payer: Self-pay

## 2022-04-15 NOTE — Telephone Encounter (Signed)
-----   Message from Ni Gorsuch, MD sent at 04/12/2022  3:33 PM EDT ----- He has not called to schedule PET yet Please remind him to call  

## 2022-04-15 NOTE — Telephone Encounter (Signed)
Called and given below message and phone # for radiology scheduling. He verbalized understanding and appreciated the call.

## 2022-04-20 LAB — T CELL LYMPOHMA, PCR (GENPATH)

## 2022-04-21 ENCOUNTER — Ambulatory Visit (HOSPITAL_COMMUNITY): Payer: Medicare Other

## 2022-04-21 ENCOUNTER — Encounter (HOSPITAL_COMMUNITY): Payer: Self-pay

## 2022-04-26 ENCOUNTER — Ambulatory Visit (INDEPENDENT_AMBULATORY_CARE_PROVIDER_SITE_OTHER): Payer: Medicare Other | Admitting: *Deleted

## 2022-04-26 ENCOUNTER — Encounter (HOSPITAL_COMMUNITY): Payer: Self-pay

## 2022-04-26 VITALS — BP 155/102 | HR 80 | Resp 16 | Ht 66.0 in | Wt 258.8 lb

## 2022-04-26 DIAGNOSIS — F3130 Bipolar disorder, current episode depressed, mild or moderate severity, unspecified: Secondary | ICD-10-CM

## 2022-04-26 NOTE — Progress Notes (Signed)
Patient arrived with his [name] Maintena  injection. Given in his Right Deltoid without issues or complaints. His BP was elevated at 155/102, retaken for a reading of 162/107, Dr. Doyne Keel notified and spoke with patient before injection was given. States he takes several medications for his Blood pressure. Denies SI/HI or AV hallucinations.

## 2022-04-27 ENCOUNTER — Encounter
Admission: RE | Admit: 2022-04-27 | Discharge: 2022-04-27 | Disposition: A | Discharge status: Home / Self Care | Payer: Medicare Other | Source: Ambulatory Visit | Attending: Hematology and Oncology | Admitting: Hematology and Oncology

## 2022-04-27 DIAGNOSIS — C8408 Mycosis fungoides, lymph nodes of multiple sites: Secondary | ICD-10-CM | POA: Diagnosis not present

## 2022-04-27 LAB — GLUCOSE, CAPILLARY: Glucose-Capillary: 108 mg/dL — ABNORMAL HIGH (ref 70–99)

## 2022-04-27 MED ORDER — FLUDEOXYGLUCOSE F - 18 (FDG) INJECTION
12.0000 | Freq: Once | INTRAVENOUS | Status: AC | PRN
  Administered 2022-04-27: 11.84 via INTRAVENOUS

## 2022-05-03 ENCOUNTER — Inpatient Hospital Stay: Payer: Medicare Other | Admitting: Hematology and Oncology

## 2022-05-03 ENCOUNTER — Other Ambulatory Visit (HOSPITAL_COMMUNITY): Payer: Self-pay

## 2022-05-03 ENCOUNTER — Other Ambulatory Visit: Payer: Self-pay

## 2022-05-03 ENCOUNTER — Encounter: Payer: Self-pay | Admitting: Hematology and Oncology

## 2022-05-03 VITALS — BP 120/74 | HR 7 | Temp 97.4°F | Resp 18 | Ht 66.0 in | Wt 251.4 lb

## 2022-05-03 DIAGNOSIS — C8408 Mycosis fungoides, lymph nodes of multiple sites: Secondary | ICD-10-CM

## 2022-05-03 MED ORDER — METHOTREXATE SODIUM 2.5 MG PO TABS
15.0000 mg | ORAL_TABLET | ORAL | 1 refills | Status: DC
  Filled 2022-05-03: qty 24, 28d supply, fill #0

## 2022-05-03 MED ORDER — FOLIC ACID 1 MG PO TABS
1.0000 mg | ORAL_TABLET | Freq: Every day | ORAL | 9 refills | Status: DC
  Filled 2022-05-03: qty 30, 30d supply, fill #0

## 2022-05-03 NOTE — Progress Notes (Signed)
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   All questions were answered. The patient knows to call the clinic with any problems, questions or concerns. The total time spent in the appointment was 40 minutes encounter with patients including review of chart and various tests results, discussions about plan of care and coordination of care plan   Artis Delay, MD 05/03/2022 6:17 PM  INTERVAL HISTORY: Please see below for problem oriented charting. he returns for review of imaging studies and to discuss test results He is doing well The skin changes does not bother him too much We reviewed PET CT imaging and discussed treatment options  REVIEW OF SYSTEMS:   Constitutional: Denies fevers, chills or abnormal weight loss Eyes: Denies blurriness of vision Ears, nose, mouth, throat, and face: Denies mucositis or sore throat Respiratory: Denies cough, dyspnea or wheezes Cardiovascular: Denies palpitation, chest discomfort or lower extremity swelling Gastrointestinal:  Denies nausea, heartburn or change in bowel habits Skin: Denies abnormal skin rashes Lymphatics: Denies new lymphadenopathy or easy bruising Neurological:Denies numbness, tingling or new weaknesses Behavioral/Psych:  Mood is stable, no new changes  All other systems were reviewed with the patient and are negative.  I have reviewed the past medical history, past surgical history, social history and family history with the patient and they are unchanged from previous note.  ALLERGIES:  is allergic to invega [paliperidone], olanzapine, and topamax [topiramate].  MEDICATIONS:  Current Outpatient Medications  Medication Sig Dispense Refill   folic acid (FOLVITE) 1 MG tablet Take 1 tablet (1 mg total) by mouth daily. 30 tablet 9   methotrexate (RHEUMATREX) 2.5 MG tablet Take 6 tablets (15 mg total) by mouth once a week. Caution:Chemotherapy. Protect from light. 24 tablet 1   amitriptyline (ELAVIL) 100 MG tablet Take 1 tablet (100 mg total) by mouth at bedtime. 30 tablet 3   amLODipine (NORVASC) 10 MG tablet Take 10 mg by mouth daily.     ARIPiprazole ER ([name] MAINTENA) 400 MG SRER injection Inject 2 mLs (400 mg total) into the muscle every 30 (thirty) days. 1 each 9   atenolol (TENORMIN) 25 MG tablet Take 25 mg by mouth daily.     losartan (COZAAR) 100 MG tablet Take 100 mg by mouth daily.     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 EXAM: NUCLEAR MEDICINE PET SKULL BASE TO THIGH TECHNIQUE: 11.8 mCi F-18 FDG was injected intravenously. Full-ring PET imaging was performed from the skull base to thigh after the  radiotracer. CT data was obtained and used for attenuation correction and anatomic localization. Fasting blood glucose: 108 mg/dl COMPARISON:  CT abdomen/pelvis dated 10/05/2021 FINDINGS: Mediastinal blood pool activity: SUV max 2.2 Liver activity: SUV max NA NECK: No hypermetabolic cervical lymphadenopathy. Incidental CT findings: None. CHEST: No hypermetabolic pulmonary nodules. No hypermetabolic mediastinal lymphadenopathy. Prominent bilateral axillary nodes, measuring up to 9 mm on the left (series 4/image 47) and 10 mm on the right (series 4/image 48), max SUV 2.6 and 1.8 respectively. Incidental CT findings: None. ABDOMEN/PELVIS: No abnormal hypermetabolism in the liver, pancreas, or adrenal glands. No abnormal hypermetabolism or focal lesion in the spleen, which is normal in size. No hypermetabolic abdominal or retroperitoneal lymphadenopathy. 12 mm short axis left deep inguinal node (series 4/image 133), max SUV 2.9. Mildly prominent bilateral inguinal nodes, measuring up to 11 mm on the left (series 4/image 138) and 10 mm on the right (series 4/image 139), max SUV 2.8 and 2.6 respectively. Incidental CT findings: Prostatomegaly. Thick-walled bladder, although underdistended. SKELETON: No focal hypermetabolic activity to suggest skeletal metastasis. Incidental CT findings: Grade 2 spondylolisthesis at L5-S1. Mild degenerative changes of the lower thoracic and lumbar spine. IMPRESSION: Mildly prominent bilateral axillary and inguinal nodes, possibly related to the patient's known lymphoma, as above. Spleen is within normal limits. Electronically Signed   By: Charline Bills M.D.   On: 05/02/2022 01:43      05/03/2022 Cancer Staging   Staging form: Mycosis Fungoides and Sezary Syndrome, AJCC 8th Edition - Clinical stage from 05/03/2022: Stage IIIA (cT4, cN1, cM0, B0) - Signed by Artis Delay, MD on 05/03/2022 Stage prefix: Initial diagnosis NCI-VA classification: LN0     PHYSICAL EXAMINATION: ECOG  PERFORMANCE STATUS: 0 - Asymptomatic  Vitals:   05/03/22 1141  BP: 120/74  Pulse: (!) 7  Resp: 18  Temp: (!) 97.4 F (36.3 C)  SpO2: 98%   Filed Weights   05/03/22 1141  Weight: 251 lb 6.4 oz (114 kg)    GENERAL:alert, no distress and comfortable SKIN: Diffuse erythematous skin changes and plaques NEURO: alert & oriented x 3 with fluent speech, no focal motor/sensory deficits  LABORATORY DATA:  I have reviewed the data as listed    Component Value Date/Time   NA 140 04/11/2022 1026   K 4.0 04/11/2022 1026   CL 106 04/11/2022 1026   CO2 28 04/11/2022 1026   GLUCOSE 105 (H) 04/11/2022 1026   BUN 7 04/11/2022 1026   CREATININE 1.09 04/11/2022 1026   CALCIUM 9.3 04/11/2022 1026   PROT 6.3 (L) 04/11/2022 1026   ALBUMIN 4.0 04/11/2022 1026   AST 19 04/11/2022 1026   ALT 24 04/11/2022 1026   ALKPHOS 57 04/11/2022 1026   BILITOT 0.7 04/11/2022 1026   GFRNONAA >60 04/11/2022 1026   GFRAA >60 10/06/2019 0227    No results found for: "SPEP", "UPEP"  Lab Results  Component Value Date   WBC 4.9 04/11/2022   NEUTROABS 2.8 04/11/2022   HGB 14.4 04/11/2022   HCT 43.4 04/11/2022   MCV 82.0 04/11/2022   PLT 223 04/11/2022      Chemistry      Component Value Date/Time   NA 140 04/11/2022 1026   K 4.0 04/11/2022 1026   CL  106 04/11/2022 1026   CO2 28 04/11/2022 1026   BUN 7 04/11/2022 1026   CREATININE 1.09 04/11/2022 1026      Component Value Date/Time   CALCIUM 9.3 04/11/2022 1026   ALKPHOS 57 04/11/2022 1026   AST 19 04/11/2022 1026   ALT 24 04/11/2022 1026   BILITOT 0.7 04/11/2022 1026       RADIOGRAPHIC STUDIES: I reviewed imaging studies with the patient I have personally reviewed the radiological images as listed and agreed with the findings in the report. 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 EXAM: NUCLEAR MEDICINE PET SKULL BASE TO  THIGH TECHNIQUE: 11.8 mCi F-18 FDG was injected intravenously. Full-ring PET imaging was performed from the skull base to thigh after the radiotracer. CT data was obtained and used for attenuation correction and anatomic localization. Fasting blood glucose: 108 mg/dl COMPARISON:  CT abdomen/pelvis dated 10/05/2021 FINDINGS: Mediastinal blood pool activity: SUV max 2.2 Liver activity: SUV max NA NECK: No hypermetabolic cervical lymphadenopathy. Incidental CT findings: None. CHEST: No hypermetabolic pulmonary nodules. No hypermetabolic mediastinal lymphadenopathy. Prominent bilateral axillary nodes, measuring up to 9 mm on the left (series 4/image 47) and 10 mm on the right (series 4/image 48), max SUV 2.6 and 1.8 respectively. Incidental CT findings: None. ABDOMEN/PELVIS: No abnormal hypermetabolism in the liver, pancreas, or adrenal glands. No abnormal hypermetabolism or focal lesion in the spleen, which is normal in size. No hypermetabolic abdominal or retroperitoneal lymphadenopathy. 12 mm short axis left deep inguinal node (series 4/image 133), max SUV 2.9. Mildly prominent bilateral inguinal nodes, measuring up to 11 mm on the left (series 4/image 138) and 10 mm on the right (series 4/image 139), max SUV 2.8 and 2.6 respectively. Incidental CT findings: Prostatomegaly. Thick-walled bladder, although underdistended. SKELETON: No focal hypermetabolic activity to suggest skeletal metastasis. Incidental CT findings: Grade 2 spondylolisthesis at L5-S1. Mild degenerative changes of the lower thoracic and lumbar spine. IMPRESSION: Mildly prominent bilateral axillary and inguinal nodes, possibly related to the patient's known lymphoma, as above. Spleen is within normal limits. Electronically Signed   By: Charline Bills M.D.   On: 05/02/2022 01:43

## 2022-05-03 NOTE — Assessment & Plan Note (Signed)
I have reviewed multiple imaging studies with the patient We discussed staging of disease We discussed treatment options including brentuximab versus methotrexate The patient was prescribed methotrexate in the past and tolerated well without difficulties Due to his body habitus, I would recommend we start him on 50 mg once a week We discussed importance of taking folic acid supplement I plan to see him back in 2 weeks for further follow-up I recommend minimum 3 months of treatment before repeating imaging studies

## 2022-05-04 ENCOUNTER — Telehealth: Payer: Self-pay | Admitting: Hematology and Oncology

## 2022-05-04 NOTE — Telephone Encounter (Signed)
Spoke with patient confirming upcoming appointment  

## 2022-05-13 ENCOUNTER — Other Ambulatory Visit: Payer: Self-pay | Admitting: Hematology and Oncology

## 2022-05-13 DIAGNOSIS — C8408 Mycosis fungoides, lymph nodes of multiple sites: Secondary | ICD-10-CM

## 2022-05-17 ENCOUNTER — Encounter: Payer: Self-pay | Admitting: Hematology and Oncology

## 2022-05-17 ENCOUNTER — Inpatient Hospital Stay: Payer: Medicare Other | Attending: Hematology and Oncology

## 2022-05-17 ENCOUNTER — Inpatient Hospital Stay (HOSPITAL_BASED_OUTPATIENT_CLINIC_OR_DEPARTMENT_OTHER): Payer: Medicare Other | Admitting: Hematology and Oncology

## 2022-05-17 ENCOUNTER — Other Ambulatory Visit: Payer: Self-pay

## 2022-05-17 VITALS — BP 122/72 | HR 67 | Temp 97.4°F | Resp 18 | Ht 66.0 in | Wt 253.8 lb

## 2022-05-17 DIAGNOSIS — C8408 Mycosis fungoides, lymph nodes of multiple sites: Secondary | ICD-10-CM | POA: Insufficient documentation

## 2022-05-17 DIAGNOSIS — Z79899 Other long term (current) drug therapy: Secondary | ICD-10-CM | POA: Insufficient documentation

## 2022-05-17 LAB — COMPREHENSIVE METABOLIC PANEL
ALT: 24 U/L (ref 0–44)
AST: 19 U/L (ref 15–41)
Albumin: 4.1 g/dL (ref 3.5–5.0)
Alkaline Phosphatase: 59 U/L (ref 38–126)
Anion gap: 5 (ref 5–15)
BUN: 10 mg/dL (ref 6–20)
CO2: 29 mmol/L (ref 22–32)
Calcium: 9.1 mg/dL (ref 8.9–10.3)
Chloride: 107 mmol/L (ref 98–111)
Creatinine, Ser: 1.15 mg/dL (ref 0.61–1.24)
GFR, Estimated: >60 mL/min (ref >60)
Glucose, Bld: 126 mg/dL — ABNORMAL HIGH (ref 70–99)
Potassium: 3.9 mmol/L (ref 3.5–5.1)
Sodium: 141 mmol/L (ref 135–145)
Total Bilirubin: 0.5 mg/dL (ref 0.3–1.2)
Total Protein: 6.4 g/dL — ABNORMAL LOW (ref 6.5–8.1)

## 2022-05-17 LAB — CBC WITH DIFFERENTIAL/PLATELET
Abs Immature Granulocytes: 0.01 10*3/uL (ref 0.00–0.07)
Basophils Absolute: 0 10*3/uL (ref 0.0–0.1)
Basophils Relative: 1 %
Eosinophils Absolute: 0.1 10*3/uL (ref 0.0–0.5)
Eosinophils Relative: 3 %
HCT: 44.7 % (ref 39.0–52.0)
Hemoglobin: 14.6 g/dL (ref 13.0–17.0)
Immature Granulocytes: 0 %
Lymphocytes Relative: 32 %
Lymphs Abs: 1.5 10*3/uL (ref 0.7–4.0)
MCH: 27.2 pg (ref 26.0–34.0)
MCHC: 32.7 g/dL (ref 30.0–36.0)
MCV: 83.2 fL (ref 80.0–100.0)
Monocytes Absolute: 0.3 10*3/uL (ref 0.1–1.0)
Monocytes Relative: 6 %
Neutro Abs: 2.8 10*3/uL (ref 1.7–7.7)
Neutrophils Relative %: 58 %
Platelets: 212 10*3/uL (ref 150–400)
RBC: 5.37 MIL/uL (ref 4.22–5.81)
RDW: 13.1 % (ref 11.5–15.5)
WBC: 4.8 10*3/uL (ref 4.0–10.5)
nRBC: 0 % (ref 0.0–0.2)

## 2022-05-17 IMAGING — CT CT HEAD W/O CM
3 series · 15 of 47 positions shown, 18 images · non-contrast
Comparison: None.

CLINICAL DATA: Laceration and fall, COVID positive

EXAM:
CT HEAD WITHOUT CONTRAST
TECHNIQUE: Contiguous axial images were obtained from the base of the skull
through the vertex without intravenous contrast.

[Series 2: head wo · axial · 0.47mm/px · z∈[-138,-13]mm · 9 of 31 slices shown, 12 images]
[im 3/31  brain]
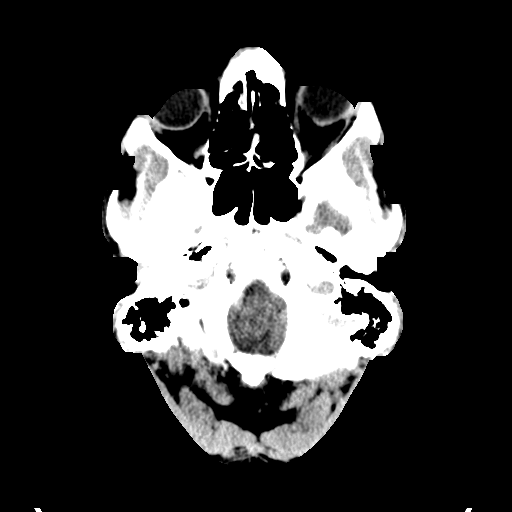
[im 3/31  bone]
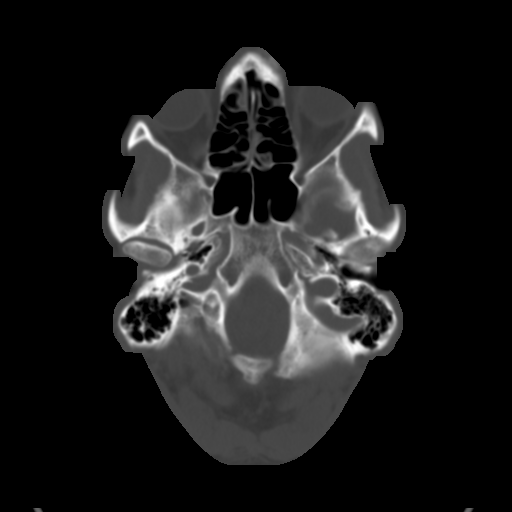
[im 6/31  brain]
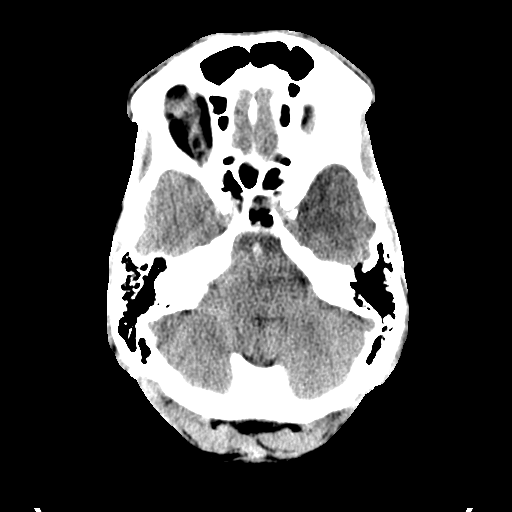
[im 9/31  brain]
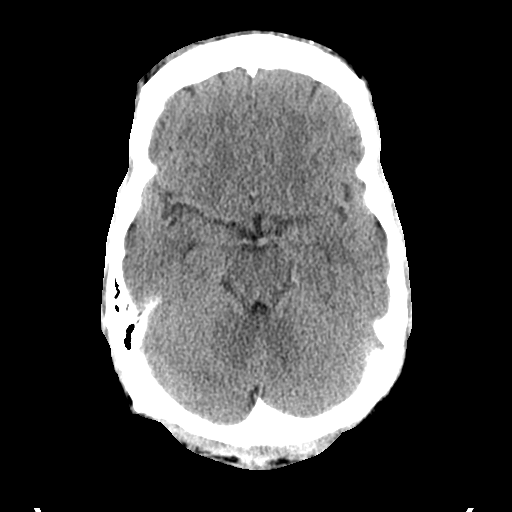
[im 12/31  brain]
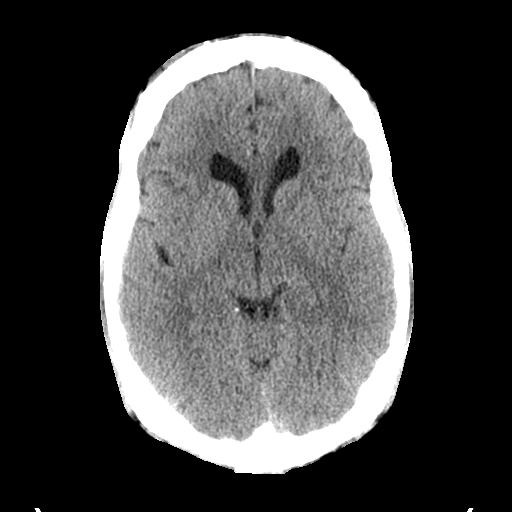
[im 16/31  brain]
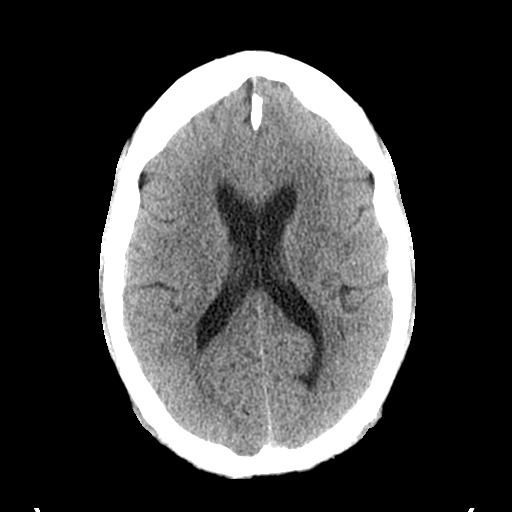
[im 16/31  bone]
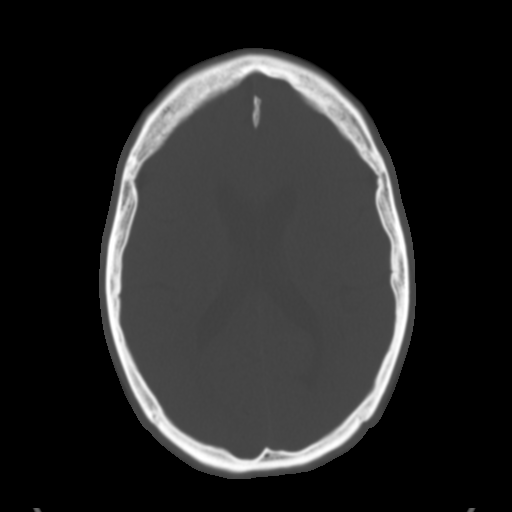
[im 19/31  brain]
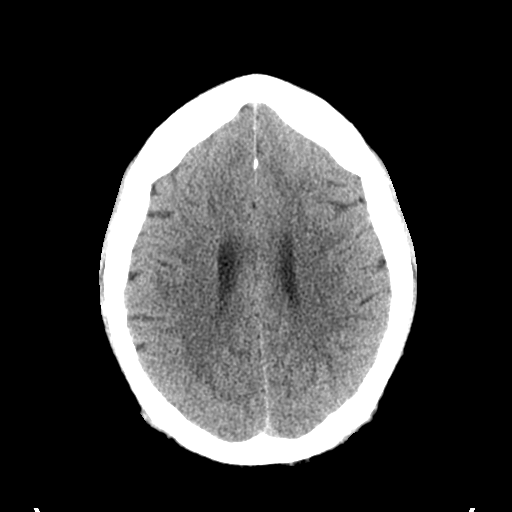
[im 22/31  brain]
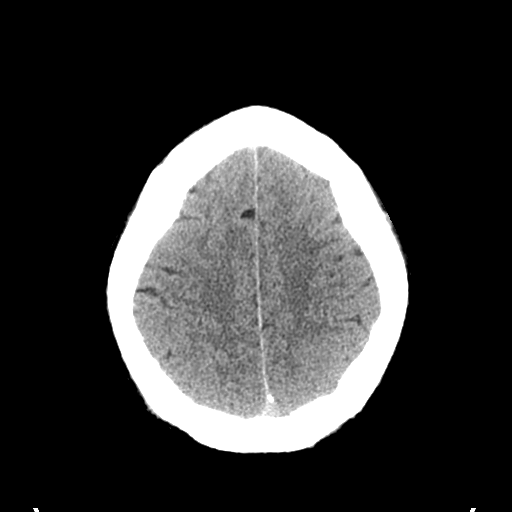
[im 25/31  brain]
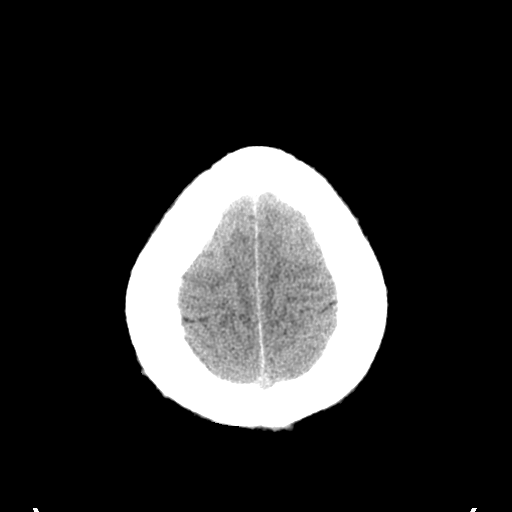
[im 28/31  brain]
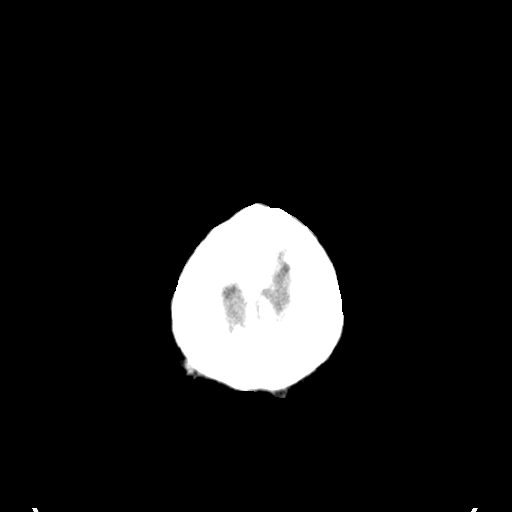
[im 28/31  bone]
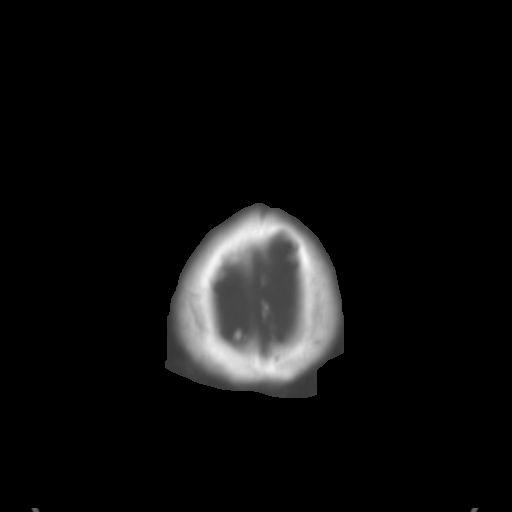

[Series 5: coronal soft tissue · coronal · 0.36mm/px · 3 of 78 slices shown]
[im 26/78  brain]
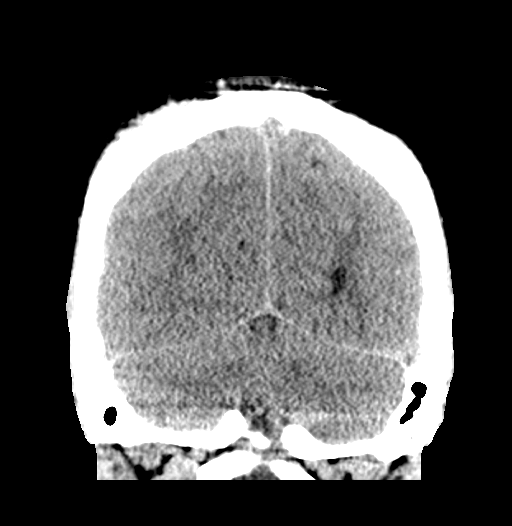
[im 35/78  brain]
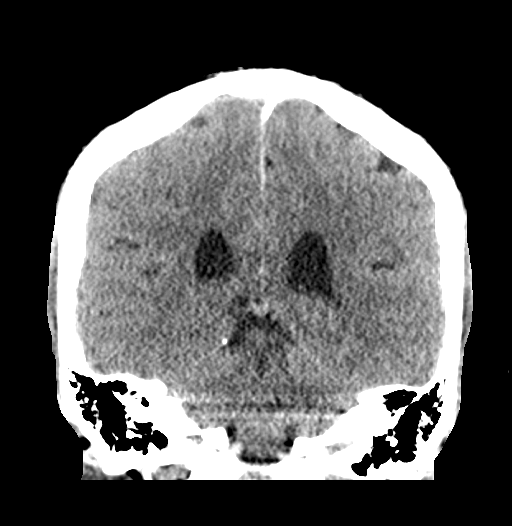
[im 43/78  brain]
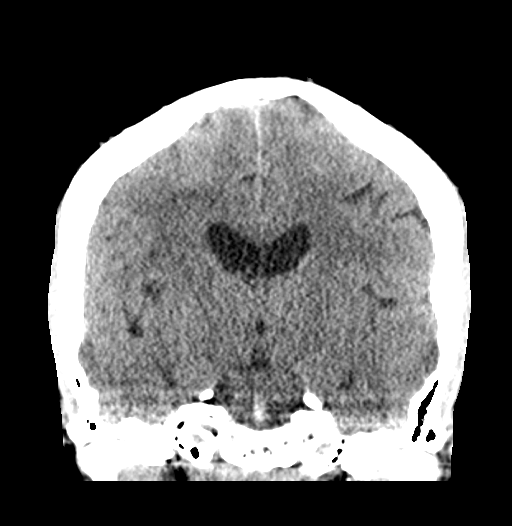

[Series 6: sagittal soft tissue · sagittal · 0.33mm/px · 3 of 61 slices shown]
[im 21/61  brain]
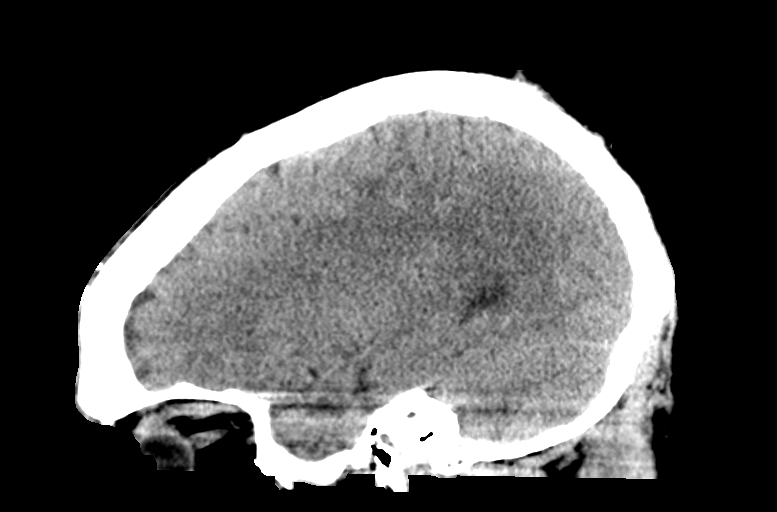
[im 31/61  brain]
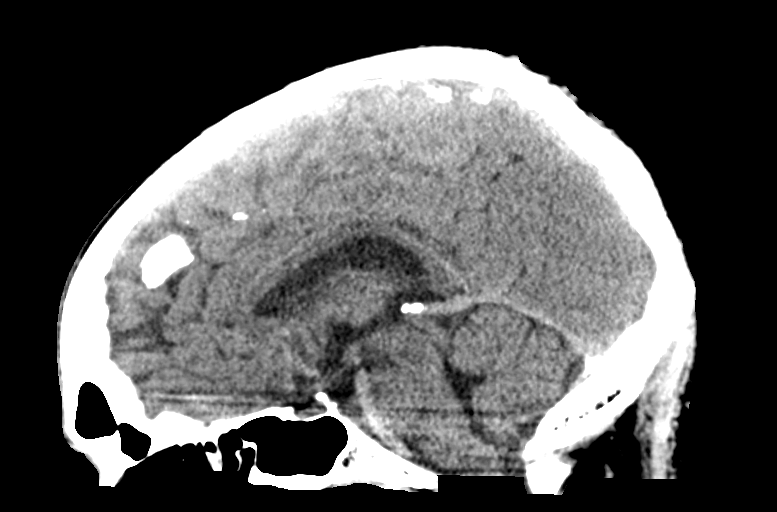
[im 41/61  brain]
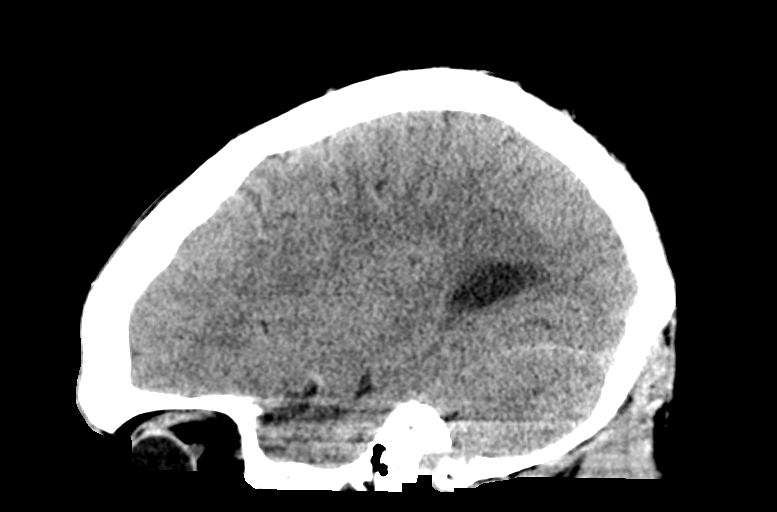

[15 of 47 positions shown; findings below may reference images not displayed]

FINDINGS: Brain: No evidence of acute territorial infarction, hemorrhage,
hydrocephalus,extra-axial collection or mass lesion/mass effect.
Normal gray-white differentiation. Ventricles are normal in size and
contour.

Vascular: No hyperdense vessel or unexpected calcification.

Skull: The skull is intact. No fracture or focal lesion identified.

Sinuses/Orbits: The visualized paranasal sinuses and mastoid air
cells are clear. The orbits and globes intact.

Other: Soft tissue swelling with a small hematoma seen overlying the
right frontal skull
IMPRESSION: No acute intracranial abnormality.

## 2022-05-17 NOTE — Assessment & Plan Note (Signed)
So far, he tolerated methotrexate well We will continue same dosing I reinforced the importance of daily folic acid supplement I recommend minimum 3 months of treatment before repeating imaging study in August

## 2022-05-17 NOTE — Progress Notes (Signed)
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   All questions were answered. The patient knows to call the clinic with any problems, questions or concerns. The total time spent in the appointment was 20 minutes encounter with patients including review of chart and various tests results, discussions about plan of care and coordination of care plan   Artis Delay, MD 05/17/2022 12:07 PM  INTERVAL HISTORY: Please see below for problem oriented charting. he returns for treatment follow-up weekly methotrexate So far, he tolerated treatment well He denies side effects from treatment, such as nausea, mucositis no changes in bowel habits He has passage of red-colored urine 1 day but that resolved.  Denies dysuria, frequency or urgency  REVIEW OF SYSTEMS:   Constitutional: Denies fevers, chills or abnormal weight loss Eyes: Denies blurriness of vision Ears, nose, mouth, throat, and face: Denies mucositis or sore throat Respiratory: Denies cough, dyspnea or wheezes Cardiovascular: Denies palpitation, chest discomfort or lower extremity swelling Gastrointestinal:  Denies nausea, heartburn or change in bowel habits Skin: Denies abnormal skin rashes Lymphatics: Denies new lymphadenopathy or easy bruising Neurological:Denies numbness, tingling or new weaknesses Behavioral/Psych: Mood is stable, no new changes  All other systems were reviewed with the patient and are negative.  I have reviewed the past medical history, past surgical history, social history and family history with the patient and  they are unchanged from previous note.  ALLERGIES:  is allergic to invega [paliperidone], olanzapine, and topamax [topiramate].  MEDICATIONS:  Current Outpatient Medications  Medication Sig Dispense Refill   amitriptyline (ELAVIL) 100 MG tablet Take 1 tablet (100 mg total) by mouth at bedtime. 30 tablet 3   amLODipine (NORVASC) 10 MG tablet Take 10 mg by mouth daily.     ARIPiprazole ER ([name] MAINTENA) 400 MG SRER injection Inject 2 mLs (400 mg total) into the muscle every 30 (thirty) days. 1 each 9   atenolol (TENORMIN) 25 MG tablet Take 25 mg by mouth daily.     folic acid (FOLVITE) 1 MG tablet Take 1 tablet (1 mg total) by mouth daily. 30 tablet 9   losartan (COZAAR) 100 MG tablet Take 100 mg by mouth daily.     methotrexate (RHEUMATREX) 2.5 MG tablet Take 6 tablets (15 mg total) by mouth once a week. Caution:Chemotherapy. Protect from light. 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 EXAM: NUCLEAR MEDICINE PET SKULL BASE TO THIGH TECHNIQUE: 11.8 mCi F-18 FDG was injected intravenously. Full-ring PET imaging was performed from the skull  base to thigh after the radiotracer. CT data was obtained and used for attenuation correction and anatomic localization. Fasting blood glucose: 108 mg/dl COMPARISON:  CT abdomen/pelvis dated 10/05/2021 FINDINGS: Mediastinal blood pool activity: SUV max 2.2  Liver activity: SUV max NA NECK: No hypermetabolic cervical lymphadenopathy. Incidental CT findings: None. CHEST: No hypermetabolic pulmonary nodules. No hypermetabolic mediastinal lymphadenopathy. Prominent bilateral axillary nodes, measuring up to 9 mm on the left (series 4/image 47) and 10 mm on the right (series 4/image 48), max SUV 2.6 and 1.8 respectively. Incidental CT findings: None. ABDOMEN/PELVIS: No abnormal hypermetabolism in the liver, pancreas, or adrenal glands. No abnormal hypermetabolism or focal lesion in the spleen, which is normal in size. No hypermetabolic abdominal or retroperitoneal lymphadenopathy. 12 mm short axis left deep inguinal node (series 4/image 133), max SUV 2.9. Mildly prominent bilateral inguinal nodes, measuring up to 11 mm on the left (series 4/image 138) and 10 mm on the right (series 4/image 139), max SUV 2.8 and 2.6 respectively. Incidental CT findings: Prostatomegaly. Thick-walled bladder, although underdistended. SKELETON: No focal hypermetabolic activity to suggest skeletal metastasis. Incidental CT findings: Grade 2 spondylolisthesis at L5-S1. Mild degenerative changes of the lower thoracic and lumbar spine. IMPRESSION: Mildly prominent bilateral axillary and inguinal nodes, possibly related to the patient's known lymphoma, as above. Spleen is within normal limits. Electronically Signed   By: Charline Bills M.D.   On: 05/02/2022 01:43      05/03/2022 Cancer Staging   Staging form: Mycosis Fungoides and Sezary Syndrome, AJCC 8th Edition - Clinical stage from 05/03/2022: Stage IIIA (cT4, cN1, cM0, B0) - Signed by Artis Delay, MD on 05/03/2022 Stage prefix: Initial diagnosis NCI-VA classification: LN0   05/04/2022 -  Chemotherapy   He is started on weekly methotrexate at 15 mg     PHYSICAL EXAMINATION: ECOG PERFORMANCE STATUS: 1 - Symptomatic but completely ambulatory  Vitals:   05/17/22 0846  BP: 122/72  Pulse: 67  Resp: 18  Temp: (!) 97.4 F (36.3 C)   SpO2: 99%   Filed Weights   05/17/22 0846  Weight: 253 lb 12.8 oz (115.1 kg)    GENERAL:alert, no distress and comfortable SKIN: There is no appreciable changes to his skin NEURO: alert & oriented x 3 with fluent speech, no focal motor/sensory deficits  LABORATORY DATA:  I have reviewed the data as listed    Component Value Date/Time   NA 141 05/17/2022 0830   K 3.9 05/17/2022 0830   CL 107 05/17/2022 0830   CO2 29 05/17/2022 0830   GLUCOSE 126 (H) 05/17/2022 0830   BUN 10 05/17/2022 0830   CREATININE 1.15 05/17/2022 0830   CREATININE 1.09 04/11/2022 1026   CALCIUM 9.1 05/17/2022 0830   PROT 6.4 (L) 05/17/2022 0830   ALBUMIN 4.1 05/17/2022 0830   AST 19 05/17/2022 0830   AST 19 04/11/2022 1026   ALT 24 05/17/2022 0830   ALT 24 04/11/2022 1026   ALKPHOS 59 05/17/2022 0830   BILITOT 0.5 05/17/2022 0830   BILITOT 0.7 04/11/2022 1026   GFRNONAA >60 05/17/2022 0830   GFRNONAA >60 04/11/2022 1026   GFRAA >60 10/06/2019 0227    No results found for: "SPEP", "UPEP"  Lab Results  Component Value Date   WBC 4.8 05/17/2022   NEUTROABS 2.8 05/17/2022   HGB 14.6 05/17/2022   HCT 44.7 05/17/2022   MCV 83.2 05/17/2022   PLT 212 05/17/2022      Chemistry      Component Value Date/Time   NA  141 05/17/2022 0830   K 3.9 05/17/2022 0830   CL 107 05/17/2022 0830   CO2 29 05/17/2022 0830   BUN 10 05/17/2022 0830   CREATININE 1.15 05/17/2022 0830   CREATININE 1.09 04/11/2022 1026      Component Value Date/Time   CALCIUM 9.1 05/17/2022 0830   ALKPHOS 59 05/17/2022 0830   AST 19 05/17/2022 0830   AST 19 04/11/2022 1026   ALT 24 05/17/2022 0830   ALT 24 04/11/2022 1026   BILITOT 0.5 05/17/2022 0830   BILITOT 0.7 04/11/2022 1026       RADIOGRAPHIC STUDIES: I have personally reviewed the radiological images as listed and agreed with the findings in the report. NM PET Image Initial (PI) Skull Base To Thigh (F-18 FDG)  Result Date: 05/02/2022 CLINICAL DATA:  Initial  treatment strategy for cutaneous T-cell lymphoma. EXAM: NUCLEAR MEDICINE PET SKULL BASE TO THIGH TECHNIQUE: 11.8 mCi F-18 FDG was injected intravenously. Full-ring PET imaging was performed from the skull base to thigh after the radiotracer. CT data was obtained and used for attenuation correction and anatomic localization. Fasting blood glucose: 108 mg/dl COMPARISON:  CT abdomen/pelvis dated 10/05/2021 FINDINGS: Mediastinal blood pool activity: SUV max 2.2 Liver activity: SUV max NA NECK: No hypermetabolic cervical lymphadenopathy. Incidental CT findings: None. CHEST: No hypermetabolic pulmonary nodules. No hypermetabolic mediastinal lymphadenopathy. Prominent bilateral axillary nodes, measuring up to 9 mm on the left (series 4/image 47) and 10 mm on the right (series 4/image 48), max SUV 2.6 and 1.8 respectively. Incidental CT findings: None. ABDOMEN/PELVIS: No abnormal hypermetabolism in the liver, pancreas, or adrenal glands. No abnormal hypermetabolism or focal lesion in the spleen, which is normal in size. No hypermetabolic abdominal or retroperitoneal lymphadenopathy. 12 mm short axis left deep inguinal node (series 4/image 133), max SUV 2.9. Mildly prominent bilateral inguinal nodes, measuring up to 11 mm on the left (series 4/image 138) and 10 mm on the right (series 4/image 139), max SUV 2.8 and 2.6 respectively. Incidental CT findings: Prostatomegaly. Thick-walled bladder, although underdistended. SKELETON: No focal hypermetabolic activity to suggest skeletal metastasis. Incidental CT findings: Grade 2 spondylolisthesis at L5-S1. Mild degenerative changes of the lower thoracic and lumbar spine. IMPRESSION: Mildly prominent bilateral axillary and inguinal nodes, possibly related to the patient's known lymphoma, as above. Spleen is within normal limits. Electronically Signed   By: Charline Bills M.D.   On: 05/02/2022 01:43

## 2022-05-26 ENCOUNTER — Ambulatory Visit (INDEPENDENT_AMBULATORY_CARE_PROVIDER_SITE_OTHER): Payer: Medicare Other | Admitting: Psychiatry

## 2022-05-26 ENCOUNTER — Encounter (HOSPITAL_COMMUNITY): Payer: Self-pay

## 2022-05-26 ENCOUNTER — Encounter (HOSPITAL_COMMUNITY): Payer: Self-pay | Admitting: Psychiatry

## 2022-05-26 ENCOUNTER — Ambulatory Visit (INDEPENDENT_AMBULATORY_CARE_PROVIDER_SITE_OTHER): Payer: Medicare Other

## 2022-05-26 VITALS — BP 132/81 | HR 64 | Ht 66.0 in | Wt 255.0 lb

## 2022-05-26 DIAGNOSIS — F3130 Bipolar disorder, current episode depressed, mild or moderate severity, unspecified: Secondary | ICD-10-CM

## 2022-05-26 DIAGNOSIS — F200 Paranoid schizophrenia: Secondary | ICD-10-CM

## 2022-05-26 DIAGNOSIS — F411 Generalized anxiety disorder: Secondary | ICD-10-CM

## 2022-05-26 DIAGNOSIS — G4700 Insomnia, unspecified: Secondary | ICD-10-CM

## 2022-05-26 MED ORDER — AMITRIPTYLINE HCL 100 MG PO TABS: 100.0000 mg | ORAL_TABLET | Freq: Every day | ORAL | 3 refills | Status: DC

## 2022-05-26 MED ORDER — ARIPIPRAZOLE ER 400 MG IM SRER
400.0000 mg | INTRAMUSCULAR | 11 refills | Status: DC
Start: 2022-05-26 — End: 2022-11-30

## 2022-05-26 NOTE — Progress Notes (Cosign Needed)
Patient presents to the for [name] Maintena 400 mg in Left Deltoid given by Antony Salmon CMA, Patient will return in 28 day

## 2022-05-26 NOTE — Progress Notes (Signed)
BH MD/PA/NP OP Progress Note     05/26/2022 10:31 AM Chris Rogers  MRN:  [id number]  Chief Complaint: "I am good"   HPI: 47 year old male seen today for follow up psychiatric evaluation. He has a psychiatric history of bipolar affective disorder, bipolar 1, and depression managed on amitriptyline 100 mg nightly and [name] 400 mg q. 28 days. He notes that his medications are effective in managing his psychiatric conditions.    Today he was well-groomed, pleasant, cooperative, engaged in conversation, and maintained eye contact.  Patient informed Clinical research associate that he has good. He notes that he stays busy remodeling his his mothers home. Patient notes that his mood is stable and reports that he has minimal anxiety and depression. Today provider conducted a GAD 7 and patient scored a 4. Provider also conducted a PHQ 9 and patient scored a 3. He endorses adequate sleep and appetite. Today he denies SI/HI/VAH or paranoia.   Overall patient notes that he is doing well. No medication changes made today. Patient agreeable to continue medications as prescribed.  No other concerns noted at this time.        Visit Diagnosis:    ICD-10-CM   1. Bipolar I disorder, most recent episode depressed (HCC)  F31.30 amitriptyline (ELAVIL) 100 MG tablet    ARIPiprazole ER ([name] MAINTENA) 400 MG SRER injection        Past Psychiatric History: Bipolar affective, MDD, Bipolar 1  Past Medical History:  Past Medical History:  Diagnosis Date   Bipolar 1 disorder (HCC)    Diabetes mellitus, type II (HCC)    Hypertension     Past Surgical History:  Procedure Laterality Date   KNEE SURGERY      Family Psychiatric History: Father bipolar, sister depression, brother schizophrenia  Family History:  Family History  Problem Relation Age of Onset   Hypertension Mother    Hyperlipidemia Mother     Social History:  Social History   Socioeconomic History   Marital status: Divorced    Spouse name: Not on  file   Number of children: 1   Years of education: Not on file   Highest education level: Not on file  Occupational History   Not on file  Tobacco Use   Smoking status: Never   Smokeless tobacco: Never  Vaping Use   Vaping Use: Never used  Substance and Sexual Activity   Alcohol use: Not Currently   Drug use: Not Currently   Sexual activity: Not on file  Other Topics Concern   Not on file  Social History Narrative   Not on file   Social Determinants of Health   Financial Resource Strain: Not on file  Food Insecurity: Not on file  Transportation Needs: Not on file  Physical Activity: Not on file  Stress: Not on file  Social Connections: Not on file    Allergies:  Allergies  Allergen Reactions   Invega [Paliperidone] Other (See Comments)    "manic episode"   Olanzapine Other (See Comments)    Mania   Topamax [Topiramate] Other (See Comments)    Patient states "glaucoma like symptoms" and blindness    Metabolic Disorder Labs: Lab Results  Component Value Date   HGBA1C 6.3 (H) 06/29/2021   MPG 134 06/29/2021   MPG 139.85 06/22/2019   No results found for: "PROLACTIN" Lab Results  Component Value Date   CHOL 210 (H) 06/29/2021   TRIG 39 06/29/2021   HDL 60 06/29/2021   CHOLHDL 3.5  06/29/2021   VLDL 8 06/29/2021   LDLCALC 142 (H) 06/29/2021   LDLCALC 114 (H) 06/22/2019   Lab Results  Component Value Date   TSH 2.290 06/29/2021   TSH 2.897 06/22/2019    Therapeutic Level Labs: No results found for: "LITHIUM" No results found for: "VALPROATE" No results found for: "CBMZ"  Current Medications: Current Outpatient Medications  Medication Sig Dispense Refill   amitriptyline (ELAVIL) 100 MG tablet Take 1 tablet (100 mg total) by mouth at bedtime. 30 tablet 3   amLODipine (NORVASC) 10 MG tablet Take 10 mg by mouth daily.     ARIPiprazole ER ([name] MAINTENA) 400 MG SRER injection Inject 2 mLs (400 mg total) into the muscle every 30 (thirty) days. 1 each  11   atenolol (TENORMIN) 25 MG tablet Take 25 mg by mouth daily.     folic acid (FOLVITE) 1 MG tablet Take 1 tablet (1 mg total) by mouth daily. 30 tablet 9   losartan (COZAAR) 100 MG tablet Take 100 mg by mouth daily.     methotrexate (RHEUMATREX) 2.5 MG tablet Take 6 tablets (15 mg total) by mouth once a week. Caution:Chemotherapy. Protect from light. 24 tablet 1   Current Facility-Administered Medications  Medication Dose Route Frequency Provider Last Rate Last Admin   ARIPiprazole ER ([name] MAINTENA) 400 MG prefilled syringe 400 mg  400 mg Intramuscular Q28 days Rankin, Shuvon B, NP   400 mg at 05/26/22 1003     Musculoskeletal: Strength & Muscle Tone: within normal limits Gait & Station: normal Patient leans: N/A  Psychiatric Specialty Exam: Review of Systems  There were no vitals taken for this visit.There is no height or weight on file to calculate BMI.  General Appearance: Well Groomed  Eye Contact:  Good  Speech:  Clear and Coherent and Normal Rate  Volume:  Normal  Mood:  Depressed  Affect:  Appropriate and Congruent  Thought Process:  Coherent, Goal Directed and Linear  Orientation:  Full (Time, Place, and Person)  Thought Content: WDL and Logical   Suicidal Thoughts:  No  Homicidal Thoughts:  No,   Memory:  Immediate;   Good Recent;   Good Remote;   Good  Judgement:  Good  Insight:  Good  Psychomotor Activity:  Normal  Concentration:  Concentration: Good and Attention Span: Good  Recall:  Good  Fund of Knowledge: Good  Language: Good  Akathisia:  No  Handed:  Right  AIMS (if indicated): Not done  Assets:  Communication Skills Desire for Improvement Financial Resources/Insurance Housing Social Support  ADL's:  Intact  Cognition: WNL  Sleep:  Good   Screenings: GAD-7    Flowsheet Row Office Visit from 09/07/2021 in [facility] Clinical Support from 01/07/2021 in [facility] Clinical Support from  12/24/2020 in [facility] Clinical Support from 11/11/2020 in [facility] Clinical Support from 05/20/2020 in [facility]  Total GAD-7 Score 4 7 18 1 2       PHQ2-9    Flowsheet Row Office Visit from 09/07/2021 in [facility] Clinical Support from 08/11/2021 in [facility] Office Visit from 03/09/2021 in [facility] Clinical Support from 01/07/2021 in [facility] Clinical Support from 12/24/2020 in [facility]  PHQ-2 Total Score 4 6 0 0 3  PHQ-9 Total Score 7 22 -- 8 12  Flowsheet Row Office Visit from 09/07/2021 in [facility] Clinical Support from 08/11/2021 in [facility] from 06/29/2021 in [facility]  C-SSRS RISK CATEGORY Error: Question 6 not populated Low Risk Low Risk        Assessment and Plan: Patient reports that he has been doing well. No medication changes made today patient agreeable to continue medications as prescribe.   1. Bipolar I disorder, most recent episode depressed (HCC)  Continue- amitriptyline (ELAVIL) 100 MG tablet; Take 1 tablet (100 mg total) by mouth at bedtime.  Dispense: 30 tablet; Refill: 3 Continue- ARIPiprazole ER ([name] MAINTENA) 400 MG SRER injection; Inject 2 mLs (400 mg total) into the muscle every 30 (thirty) days.  Dispense: 1 each; Refill: 9            Follow-up in 2.5 month for medication management Follow-up in 1 month for [name] injection Shanna Cisco, NP 05/26/2022, 10:31 AM

## 2022-06-21 ENCOUNTER — Encounter: Payer: Self-pay | Admitting: Hematology and Oncology

## 2022-06-21 ENCOUNTER — Inpatient Hospital Stay: Payer: Medicare Other | Admitting: Hematology and Oncology

## 2022-06-21 ENCOUNTER — Inpatient Hospital Stay: Payer: Medicare Other | Attending: Hematology and Oncology

## 2022-06-23 ENCOUNTER — Ambulatory Visit (HOSPITAL_COMMUNITY): Payer: Medicare Other

## 2022-07-28 ENCOUNTER — Ambulatory Visit (HOSPITAL_COMMUNITY): Payer: Medicare PPO

## 2022-10-26 ENCOUNTER — Telehealth (HOSPITAL_COMMUNITY): Payer: Self-pay

## 2022-10-26 NOTE — Telephone Encounter (Signed)
Patient called and vm asking to pick up his medication. I called patient back to clarify and he advised me that he was talking about his shot. Patient has not had the shot since May. I told him he would need to make an appointment. Patient states he is going out of town ans will call back when he gets home in a week.   CB number is [phone]

## 2022-11-15 ENCOUNTER — Telehealth (HOSPITAL_COMMUNITY): Payer: Self-pay

## 2022-11-30 ENCOUNTER — Encounter (HOSPITAL_COMMUNITY): Payer: Self-pay | Admitting: Psychiatry

## 2022-11-30 ENCOUNTER — Telehealth (HOSPITAL_COMMUNITY): Payer: Medicare PPO | Admitting: Psychiatry

## 2022-11-30 DIAGNOSIS — F3130 Bipolar disorder, current episode depressed, mild or moderate severity, unspecified: Secondary | ICD-10-CM

## 2022-11-30 MED ORDER — AMITRIPTYLINE HCL 100 MG PO TABS: 100.0000 mg | ORAL_TABLET | Freq: Every day | ORAL | 3 refills | Status: DC

## 2022-11-30 MED ORDER — ARIPIPRAZOLE 10 MG PO TABS: 10.0000 mg | ORAL_TABLET | Freq: Every day | ORAL | 3 refills | Status: DC

## 2022-11-30 MED ORDER — ARIPIPRAZOLE ER 400 MG IM SRER: 400.0000 mg | INTRAMUSCULAR | Status: DC
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
## 2022-12-06 ENCOUNTER — Ambulatory Visit (HOSPITAL_COMMUNITY): Payer: Medicare PPO

## 2022-12-06 ENCOUNTER — Encounter (HOSPITAL_COMMUNITY): Payer: Self-pay

## 2022-12-06 VITALS — BP 151/101 | HR 90 | Ht 69.5 in | Wt 253.0 lb

## 2022-12-06 DIAGNOSIS — F200 Paranoid schizophrenia: Secondary | ICD-10-CM

## 2022-12-06 MED ORDER — ARIPIPRAZOLE ER 400 MG IM SRER
400.0000 mg | INTRAMUSCULAR | Status: DC
  Administered 2022-12-06 – 2023-05-04 (×5): 400 mg via INTRAMUSCULAR

## 2022-12-06 NOTE — Progress Notes (Signed)
Patient in today to restart ordered [name] Maintena 400 mg IM injection every 30 days. Patient presented with appropriate affect, level mood and denied any current auditory or visual hallucinations, no issues with mood noted, and no suicidal or homicidal ideations, plans, intent or means to want to harm self or others at this time.  Patient stated he had restarted PO [name] 10 mg daily and denied any side effects to the medication.  Patient's blood pressure was elevated today, initially 156/104 and then 151/101 a few minutes later.  Patient stated he was taking his prescribed amlodipine 10 mg daily, had taken it late this morning prior to coming in, but was no longer taking past prescribed Atenolol or Lasartan that he reports being taken off of by his PCP.  Discussed with patient concerns for his elevated BP and patient agreed to contact his PCP to question if they needed to restart past medications or to make adjustments to his hypertension medication based on current numbers.  Patient's ordered [name] Maintena 400 mg IM injection prepared as ordered and given to patient in his Right Deltoid area.  Patient tolerated restarted injection without complaint of pain or discomfort and agreed to call our office back if any issues with restart, side effects, or symptoms.  Patient again agreed to contact his PCP regarding blood pressure concerns noted today and will return in 1 month for next due injection.

## 2022-12-22 ENCOUNTER — Other Ambulatory Visit (HOSPITAL_COMMUNITY): Payer: Self-pay | Admitting: Psychiatry

## 2022-12-22 DIAGNOSIS — F3130 Bipolar disorder, current episode depressed, mild or moderate severity, unspecified: Secondary | ICD-10-CM

## 2022-12-23 ENCOUNTER — Inpatient Hospital Stay: Payer: Medicare PPO

## 2022-12-23 ENCOUNTER — Inpatient Hospital Stay: Payer: Medicare PPO | Attending: Hematology and Oncology | Admitting: Hematology and Oncology

## 2022-12-23 ENCOUNTER — Encounter: Payer: Self-pay | Admitting: Hematology and Oncology

## 2022-12-23 ENCOUNTER — Telehealth: Payer: Self-pay

## 2022-12-23 ENCOUNTER — Other Ambulatory Visit (HOSPITAL_COMMUNITY): Payer: Self-pay | Admitting: Psychiatry

## 2022-12-23 ENCOUNTER — Telehealth (HOSPITAL_COMMUNITY): Payer: Self-pay

## 2022-12-23 VITALS — BP 171/111 | HR 80 | Temp 98.9°F | Resp 18 | Ht 71.5 in | Wt 269.6 lb

## 2022-12-23 DIAGNOSIS — F331 Major depressive disorder, recurrent, moderate: Secondary | ICD-10-CM | POA: Insufficient documentation

## 2022-12-23 DIAGNOSIS — C8408 Mycosis fungoides, lymph nodes of multiple sites: Secondary | ICD-10-CM | POA: Diagnosis present

## 2022-12-23 MED ORDER — TRAZODONE HCL 50 MG PO TABS: 50.0000 mg | ORAL_TABLET | Freq: Every day | ORAL | 3 refills | Status: DC

## 2022-12-23 NOTE — Assessment & Plan Note (Signed)
I have not seen the patient for almost 6 months From a last visit, we discussed a trial of methotrexate Despite the fact that he is doing well with the treatment with no side effects, the patient self discontinue his treatment since June because he was fearful of side effects He would like to be referred to tertiary center for treatment Previously, he was under the care of Dr. Domenic Schwab from [facility] and I will referred the patient back to her for further evaluation and management

## 2022-12-23 NOTE — Progress Notes (Signed)
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 Domenic Schwab from [facility] and I will referred the patient back to her for further evaluation and management  Major depressive disorder, recurrent episode, moderate (HCC) Patient is known to have mental health disorder He is here today accompanied by his mother who concurred with recommendation She is added to his contact for next person to call if we are not able to get hold of the patient  No orders of the defined types were placed in this encounter.   All questions were answered. The patient knows to call the clinic with any problems, questions or concerns. The total time spent in the appointment was 40 minutes encounter with patients including review of chart and various tests results, discussions about plan of care and coordination of care plan   Artis Delay, MD 12/23/2022 11:00 AM  INTERVAL HISTORY: Please see below for problem oriented charting. he returns for further follow-up He was last seen in May He is here accompanied by his mother The patient could not give me definitive answer why he did not show up for his follow-up appointment He was told by family members the danger of being on methotrexate and the patient self discontinued it shortly after last visit in May This is despite absence of side effects He stated he is interested to be treated in a tertiary center and requested referral  REVIEW OF SYSTEMS:    Constitutional: Denies fevers, chills or abnormal weight loss Eyes: Denies blurriness of vision Ears, nose, mouth, throat, and face: Denies mucositis or sore throat Respiratory: Denies cough, dyspnea or wheezes Cardiovascular: Denies palpitation, chest discomfort or lower extremity swelling Gastrointestinal:  Denies nausea, heartburn or change in bowel habits Skin: Denies abnormal skin rashes Lymphatics: Denies new lymphadenopathy or easy bruising Neurological:Denies numbness, tingling or new weaknesses Behavioral/Psych: Mood is stable, no new changes  All other systems were reviewed with the patient and are negative.  I have reviewed the past medical history, past surgical history, social history and family history with the patient and they are unchanged from previous note.  ALLERGIES:  is allergic to invega [paliperidone], olanzapine, and topamax [topiramate].  MEDICATIONS:  Current Outpatient Medications  Medication Sig Dispense Refill   amLODipine (NORVASC) 10 MG tablet Take 10 mg by mouth daily.     ARIPiprazole ([name]) 10 MG tablet TAKE 1 TABLET BY MOUTH EVERY DAY 90 tablet 2   ARIPiprazole ER ([name] MAINTENA) 400 MG SRER injection Inject 2 mLs (400 mg total) into the muscle every 30 (thirty) days.     atenolol (TENORMIN) 25 MG tablet Take 25 mg by mouth daily. (Patient not taking: Reported on 12/06/2022)     losartan (COZAAR) 100 MG tablet Take 100 mg by mouth daily. 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 EXAM: NUCLEAR MEDICINE PET SKULL BASE TO THIGH TECHNIQUE: 11.8 mCi F-18 FDG was injected intravenously. Full-ring PET imaging was performed from the skull base to thigh after the radiotracer. CT data was obtained and used for attenuation correction and anatomic localization. Fasting blood glucose: 108 mg/dl COMPARISON:  CT abdomen/pelvis dated 10/05/2021 FINDINGS: Mediastinal blood pool activity: SUV max 2.2 Liver activity: SUV max NA NECK: No hypermetabolic cervical lymphadenopathy. Incidental CT findings: None. CHEST: No hypermetabolic pulmonary nodules. No hypermetabolic mediastinal lymphadenopathy. Prominent bilateral axillary nodes, measuring up to 9 mm on the left (series 4/image 47) and 10 mm on the right (series 4/image 48), max SUV 2.6 and 1.8 respectively. Incidental CT findings: None. ABDOMEN/PELVIS: No abnormal hypermetabolism in the liver, pancreas, or adrenal glands. No abnormal hypermetabolism or focal lesion in the spleen, which is normal in size. No hypermetabolic abdominal or retroperitoneal lymphadenopathy. 12 mm short axis left deep inguinal node (series 4/image 133), max SUV 2.9. Mildly prominent bilateral inguinal nodes, measuring up to 11 mm on the left (series 4/image 138) and 10 mm on the right (series 4/image 139), max SUV 2.8 and 2.6 respectively. Incidental CT findings: Prostatomegaly. Thick-walled bladder, although underdistended. SKELETON: No focal hypermetabolic activity to suggest skeletal metastasis. Incidental CT findings: Grade 2 spondylolisthesis at L5-S1. Mild degenerative changes of the lower thoracic and lumbar spine. IMPRESSION: Mildly prominent bilateral axillary and inguinal nodes, possibly related to the patient's known lymphoma, as above. Spleen is within normal limits. Electronically Signed   By: Charline Bills  M.D.   On: 05/02/2022 01:43      05/03/2022 Cancer Staging   Staging form: Mycosis Fungoides and Sezary Syndrome, AJCC 8th Edition - Clinical stage from 05/03/2022: Stage IIIA (cT4, cN1, cM0, B0) - Signed by Artis Delay, MD on 05/03/2022 Stage prefix: Initial diagnosis NCI-VA classification: LN0   05/04/2022 -  Chemotherapy   He is started on weekly methotrexate at 15 mg     PHYSICAL EXAMINATION: ECOG PERFORMANCE STATUS: 1 - Symptomatic but completely ambulatory  Vitals:   12/23/22 1020  BP: (!) 171/111  Pulse: 80  Resp: 18  Temp: 98.9 F (37.2 C)  SpO2: 98%   Filed Weights   12/23/22 1020  Weight: 269 lb 9.6 oz (122.3 kg)    GENERAL:alert, no distress and comfortable SKIN: He has diffuse skin changes NEURO: alert & oriented x 3 with fluent speech, no focal motor/sensory deficits  LABORATORY DATA:  I have reviewed the data as listed    Component Value Date/Time   NA 141 05/17/2022 0830   K 3.9 05/17/2022 0830   CL 107 05/17/2022 0830   CO2 29 05/17/2022 0830   GLUCOSE 126 (H) 05/17/2022 0830   BUN 10 05/17/2022 0830   CREATININE 1.15 05/17/2022 0830   CREATININE 1.09 04/11/2022 1026   CALCIUM 9.1 05/17/2022 0830   PROT 6.4 (L) 05/17/2022 0830   ALBUMIN 4.1 05/17/2022 0830   AST 19 05/17/2022 0830   AST 19 04/11/2022 1026   ALT 24 05/17/2022 0830   ALT 24 04/11/2022 1026  ALKPHOS 59 05/17/2022 0830   BILITOT 0.5 05/17/2022 0830   BILITOT 0.7 04/11/2022 1026   GFRNONAA >60 05/17/2022 0830   GFRNONAA >60 04/11/2022 1026   GFRAA >60 10/06/2019 0227    No results found for: "SPEP", "UPEP"  Lab Results  Component Value Date   WBC 4.8 05/17/2022   NEUTROABS 2.8 05/17/2022   HGB 14.6 05/17/2022   HCT 44.7 05/17/2022   MCV 83.2 05/17/2022   PLT 212 05/17/2022      Chemistry      Component Value Date/Time   NA 141 05/17/2022 0830   K 3.9 05/17/2022 0830   CL 107 05/17/2022 0830   CO2 29 05/17/2022 0830   BUN 10 05/17/2022 0830   CREATININE 1.15  05/17/2022 0830   CREATININE 1.09 04/11/2022 1026      Component Value Date/Time   CALCIUM 9.1 05/17/2022 0830   ALKPHOS 59 05/17/2022 0830   AST 19 05/17/2022 0830   AST 19 04/11/2022 1026   ALT 24 05/17/2022 0830   ALT 24 04/11/2022 1026   BILITOT 0.5 05/17/2022 0830   BILITOT 0.7 04/11/2022 1026

## 2022-12-23 NOTE — Telephone Encounter (Signed)
Faxed referral to [facility] to Dr. Jacobo Forest at [phone], received fax confirmation.

## 2022-12-23 NOTE — Telephone Encounter (Signed)
Medication management - Telephone call with pt to follow up on 2 messages he left questioning if Dr. Doyne Keel was going to send in an order for Trazodone or Seroquel for him and stated he would like this sent to the [facility] on [address]. Patient requested one of these be sent today and agreed to send Dr. Doyne Keel the message.

## 2022-12-23 NOTE — Assessment & Plan Note (Signed)
Patient is known to have mental health disorder He is here today accompanied by his mother who concurred with recommendation She is added to his contact for next person to call if we are not able to get hold of the patient

## 2022-12-23 NOTE — Telephone Encounter (Signed)
Provider attempted to call patient twice without success.  Provider did feel trazodone 50 to 100 mg nightly as needed and sent to preferred pharmacy.  Provider left patient a message informing him to call her if further questions or concerns are needed.

## 2022-12-29 ENCOUNTER — Encounter (HOSPITAL_COMMUNITY): Payer: Self-pay

## 2022-12-29 ENCOUNTER — Ambulatory Visit (HOSPITAL_COMMUNITY): Payer: Medicare PPO

## 2022-12-29 VITALS — BP 172/113 | HR 99 | Wt 270.2 lb

## 2022-12-29 DIAGNOSIS — F411 Generalized anxiety disorder: Secondary | ICD-10-CM

## 2022-12-29 DIAGNOSIS — F200 Paranoid schizophrenia: Secondary | ICD-10-CM

## 2022-12-29 DIAGNOSIS — G4700 Insomnia, unspecified: Secondary | ICD-10-CM

## 2022-12-29 NOTE — Progress Notes (Cosign Needed)
Patient presents to the for [name] Maintena 400 mg in RIGHT Deltoid given by Antony Salmon CMA, Patient will return in 28 day's for next injection . Pt  was informed that his BP was extremely high pt informed writer that he was unable to take hypertension medication due to family emergency this am .

## 2023-01-02 NOTE — Telephone Encounter (Signed)
Rx sent 

## 2023-01-03 ENCOUNTER — Ambulatory Visit (HOSPITAL_COMMUNITY): Payer: Medicare PPO

## 2023-01-24 ENCOUNTER — Ambulatory Visit (INDEPENDENT_AMBULATORY_CARE_PROVIDER_SITE_OTHER): Payer: Medicare Other

## 2023-01-24 ENCOUNTER — Encounter (HOSPITAL_COMMUNITY): Payer: Self-pay

## 2023-01-24 VITALS — BP 137/92 | HR 94 | Wt 280.4 lb

## 2023-01-24 DIAGNOSIS — G4700 Insomnia, unspecified: Secondary | ICD-10-CM

## 2023-01-24 DIAGNOSIS — F200 Paranoid schizophrenia: Secondary | ICD-10-CM | POA: Diagnosis not present

## 2023-01-24 DIAGNOSIS — F411 Generalized anxiety disorder: Secondary | ICD-10-CM

## 2023-01-24 NOTE — Progress Notes (Cosign Needed)
Patient presents to the for [name] Maintena 400 mg in RIGHT Deltoid given by Antony Salmon CMA, Patient will return in 28 day's for next injection .

## 2023-01-26 ENCOUNTER — Ambulatory Visit (HOSPITAL_COMMUNITY): Payer: Medicare PPO

## 2023-02-08 ENCOUNTER — Telehealth (HOSPITAL_COMMUNITY): Payer: Medicare PPO | Admitting: Psychiatry

## 2023-02-09 ENCOUNTER — Encounter (HOSPITAL_COMMUNITY): Payer: Medicare Other | Admitting: Psychiatry

## 2023-02-21 ENCOUNTER — Ambulatory Visit (INDEPENDENT_AMBULATORY_CARE_PROVIDER_SITE_OTHER): Payer: Medicare Other | Admitting: Psychiatry

## 2023-02-21 ENCOUNTER — Ambulatory Visit (HOSPITAL_COMMUNITY): Payer: Medicare Other

## 2023-02-21 ENCOUNTER — Encounter (HOSPITAL_COMMUNITY): Payer: Self-pay

## 2023-02-21 VITALS — BP 142/95 | HR 105 | Wt 279.8 lb

## 2023-02-21 DIAGNOSIS — F411 Generalized anxiety disorder: Secondary | ICD-10-CM | POA: Diagnosis not present

## 2023-02-21 DIAGNOSIS — G4700 Insomnia, unspecified: Secondary | ICD-10-CM

## 2023-02-21 DIAGNOSIS — F3130 Bipolar disorder, current episode depressed, mild or moderate severity, unspecified: Secondary | ICD-10-CM

## 2023-02-21 DIAGNOSIS — F200 Paranoid schizophrenia: Secondary | ICD-10-CM

## 2023-02-21 MED ORDER — BENZTROPINE MESYLATE 0.5 MG PO TABS: 0.5000 mg | ORAL_TABLET | Freq: Two times a day (BID) | ORAL | 3 refills | Status: DC

## 2023-02-21 MED ORDER — ARIPIPRAZOLE ER 400 MG IM SRER: 400.0000 mg | INTRAMUSCULAR | Status: DC

## 2023-02-21 MED ORDER — QUETIAPINE FUMARATE 50 MG PO TABS: 50.0000 mg | ORAL_TABLET | Freq: Every day | ORAL | 3 refills | Status: DC

## 2023-02-21 MED ORDER — TRAZODONE HCL 50 MG PO TABS: 50.0000 mg | ORAL_TABLET | Freq: Every day | ORAL | 3 refills | Status: DC

## 2023-02-21 NOTE — Progress Notes (Cosign Needed)
Patient presents to the for [name] Maintena 400 mg in LEFT Deltoid given by Antony Salmon CMA, Patient will return in 26 day's for next injection .

## 2023-02-21 NOTE — Progress Notes (Signed)
BH MD/PA/NP OP Progress Note     02/21/2023 9:43 AM [facility]  MRN:  [id number]  Chief Complaint: "I am writing two books"   HPI: 48 year old male seen today for follow up psychiatric evaluation. He has a psychiatric history of bipolar affective disorder, bipolar 1, and depression managed on amitriptyline 100 mg nightly and [name] 400 mg q. 28 days. He notes that he no longer takes amitriptyline and reports [name] is somewhat effective in managing his psychiatric conditions.    Today he was well-groomed, pleasant, cooperative, engaged in conversation, and maintained eye contact. Patient presents with mania today. He informed Clinical research associate that he is writing two books. Patient notes that he plans to write a book about Tagged an app that he notes that he spends all of his time on. He also notes that he is writing a book about big booties and bullying. Patient notes that he has been more irritable. She notes that he curses at his mother for lying, snaps on his sister, and notes that he is frustrated with [employer] as they has caused infractions on his male friend. Patient notes that he plans to own [employer] and the police department. He also notes that he is not sleeping reporting that he sleeps 2 hours nightly. He describes being distracted, with racing thoughts, and fluctuations in mood. Patient notes that he impulsively spend 150.00$ on his Tagged app a few times a week. He notes that he spend this money on a dragon that is converted to diamonds that he gives to male to turn into cash. He also notes that he spend a lot of time on [employer].   Patient informed Clinical research associate that his PCP discontinued his amitriptyline. He notes that he is now managed on another medication but uncertain of the name. His blood pressure today was 142/95 today. He notes that he did take his medications today.   Since his last visit he reports that his anxiety is well managed but notes that his depression has increased.  Today provider conducted a GAD 7 and patient scored a 3, at his last visit he scored an 8. Provider also conducted a PHQ 9 and patient scored an 11, at his last visit he scored a 14. Today he denies SI/HI/VAH or paranoia. Patient notes that his appetite has increased. He notes that he has gained 10 pounds since his last visit.  Today provider conducted an aims assessment and patient scored a 0.  Patient notes that he has been taking an old prescription of trazodone and would like to continue it. Today Trazodone 50-100 mg restarted. He is also agreeable to starting cogentin 0.5 mg twice daily. He will start Seroquel 50 mg to help manage sleep and mood. At this time amitriptyline not restarted. He will continue [name] as prescribed. Potential side effects of medication and risks vs benefits of treatment vs non-treatment were explained and discussed. All questions were answered.  No other concerns noted at this time.        Visit Diagnosis:    ICD-10-CM   1. Insomnia, unspecified type  G47.00 traZODone (DESYREL) 50 MG tablet    2. Bipolar I disorder, most recent episode depressed (HCC)  F31.30 QUEtiapine (SEROQUEL) 50 MG tablet    benztropine (COGENTIN) 0.5 MG tablet    ARIPiprazole ER ([name] MAINTENA) 400 MG SRER injection         Past Psychiatric History: Bipolar affective, MDD, Bipolar 1  Past Medical History:  Past Medical History:  Diagnosis Date   Bipolar 1 disorder (HCC)    Diabetes mellitus, type II (HCC)    Hypertension     Past Surgical History:  Procedure Laterality Date   KNEE SURGERY      Family Psychiatric History: Father bipolar, sister depression, brother schizophrenia  Family History:  Family History  Problem Relation Age of Onset   Hypertension Mother    Hyperlipidemia Mother     Social History:  Social History   Socioeconomic History   Marital status: Divorced    Spouse name: Not on file   Number of children: 1   Years of education: Not on file    Highest education level: Not on file  Occupational History   Not on file  Tobacco Use   Smoking status: Never   Smokeless tobacco: Never  Vaping Use   Vaping status: Never Used  Substance and Sexual Activity   Alcohol use: Not Currently   Drug use: Not Currently   Sexual activity: Yes    Partners: Female    Birth control/protection: Condom  Other Topics Concern   Not on file  Social History Narrative   Not on file   Social Drivers of Health   Financial Resource Strain: Not on file  Food Insecurity: No Food Insecurity (05/10/2021)   [facility]   Hunger Vital Sign    Worried About Running Out of Food in the Last Year: Never true    Ran Out of Food in the Last Year: Never true  Transportation Needs: Not on file  Physical Activity: Not on file  Stress: Not on file  Social Connections: Unknown (05/10/2021)   Received from [facility]   Social Network    Social Network: Not on file    Allergies:  Allergies  Allergen Reactions   Invega [Paliperidone] Other (See Comments)    "manic episode"   Olanzapine Other (See Comments)    Mania   Topamax [Topiramate] Other (See Comments)    Patient states "glaucoma like symptoms" and blindness    Metabolic Disorder Labs: Lab Results  Component Value Date   HGBA1C 6.3 (H) 06/29/2021   MPG 134 06/29/2021   MPG 139.85 06/22/2019   No results found for: "PROLACTIN" Lab Results  Component Value Date   CHOL 210 (H) 06/29/2021   TRIG 39 06/29/2021   HDL 60 06/29/2021   CHOLHDL 3.5 06/29/2021   VLDL 8 06/29/2021   LDLCALC 142 (H) 06/29/2021   LDLCALC 114 (H) 06/22/2019   Lab Results  Component Value Date   TSH 2.290 06/29/2021   TSH 2.897 06/22/2019    Therapeutic Level Labs: No results found for: "LITHIUM" No results found for: "VALPROATE" No results found for: "CBMZ"  Current Medications: Current Outpatient Medications  Medication Sig Dispense Refill   benztropine  (COGENTIN) 0.5 MG tablet Take 1 tablet (0.5 mg total) by mouth 2 (two) times daily. 60 tablet 3   QUEtiapine (SEROQUEL) 50 MG tablet Take 1 tablet (50 mg total) by mouth at bedtime. 30 tablet 3   amLODipine (NORVASC) 10 MG tablet Take 10 mg by mouth daily.     ARIPiprazole ER ([name] MAINTENA) 400 MG SRER injection Inject 2 mLs (400 mg total) into the muscle every 30 (thirty) days.     atenolol (TENORMIN) 25 MG tablet Take 25 mg by mouth daily. (Patient not taking: Reported on 12/06/2022)     losartan (COZAAR) 100 MG tablet Take 100 mg by mouth daily. (Patient not  taking: Reported on 12/06/2022)     traZODone (DESYREL) 50 MG tablet Take 1-2 tablets (50-100 mg total) by mouth at bedtime. 60 tablet 3   Current Facility-Administered Medications  Medication Dose Route Frequency Provider Last Rate Last Admin   ARIPiprazole ER ([name] MAINTENA) injection 400 mg  400 mg Intramuscular Q30 days Toy Cookey E, NP   400 mg at 02/21/23 0911     Musculoskeletal: Strength & Muscle Tone: within normal limits Gait & Station: normal Patient leans: N/A  Psychiatric Specialty Exam: Review of Systems  There were no vitals taken for this visit.There is no height or weight on file to calculate BMI.  General Appearance: Well Groomed  Eye Contact:  Good  Speech:  Clear and Coherent and Normal Rate  Volume:  Normal  Mood:  Euphoric  Affect:  Appropriate and Congruent  Thought Process:  Coherent, Goal Directed and Linear  Orientation:  Full (Time, Place, and Person)  Thought Content: WDL and Logical   Suicidal Thoughts:  No  Homicidal Thoughts:  No,   Memory:  Immediate;   Good Recent;   Good Remote;   Good  Judgement:  Fair  Insight:  Fair  Psychomotor Activity:  Normal  Concentration:  Concentration: Good and Attention Span: Good  Recall:  Good  Fund of Knowledge: Good  Language: Good  Akathisia:  No  Handed:  Right  AIMS (if indicated):  done  Assets:  Communication Skills Desire for  Improvement Financial Resources/Insurance Housing Social Support  ADL's:  Intact  Cognition: WNL  Sleep:  Poor   Screenings: AIMS    Flowsheet Row Clinical Support from 02/21/2023 in [facility] Most recent reading at 02/21/2023  9:43 AM Clinical Support from 02/21/2023 in [facility] Most recent reading at 02/21/2023  9:12 AM  AIMS Total Score 0 0      GAD-7    Flowsheet Row Clinical Support from 02/21/2023 in [facility] Most recent reading at 02/21/2023  9:14 AM Clinical Support from 02/21/2023 in [facility] Most recent reading at 02/21/2023  9:13 AM Video Visit from 11/30/2022 in [facility] Most recent reading at 11/30/2022 10:01 AM Office Visit from 09/07/2021 in [facility] Most recent reading at 09/07/2021 10:50 AM Clinical Support from 01/07/2021 in [facility] Most recent reading at 01/07/2021  8:49 AM  Total GAD-7 Score 3 3 8 4 7       PHQ2-9    Flowsheet Row Clinical Support from 02/21/2023 in [facility] Most recent reading at 02/21/2023  9:13 AM Clinical Support from 02/21/2023 in [facility] Most recent reading at 02/21/2023  9:11 AM Video Visit from 11/30/2022 in [facility] Most recent reading at 11/30/2022  9:59 AM Office Visit from 09/07/2021 in [facility] Most recent reading at 09/07/2021 10:51 AM Clinical Support from 08/11/2021 in [facility] Most recent reading at 08/11/2021  1:54 PM  PHQ-2 Total Score 0 0 4 4 6   PHQ-9 Total Score 11 11 14 7 22       Flowsheet Row Clinical Support from 02/21/2023 in [facility] Most recent reading at 02/21/2023  9:13 AM Clinical Support from 02/21/2023 in [facility] Most recent reading at 02/21/2023  9:11 AM Video Visit from 11/30/2022 in [facility] Most  recent reading at 11/30/2022  9:59 AM  C-SSRS RISK CATEGORY Error: Q3, 4, or 5 should not be populated when Q2 is No Error: Q3, 4, or 5 should not be populated when Q2 is No Error: Q7 should not be populated when Q6 is No        Assessment and Plan: Patient presents with mania today. He also notes that he is not sleeping. Patient notes that he has been taking an old prescription of trazodone and would like to continue it. Today Trazodone 50-100 mg restarted. He is also agreeable to starting cogentin 0.5 mg twice daily. He will start Seroquel 50 mg to help manage sleep and mood. At this time amitriptyline not restarted. He will continue [name] as prescribed.  1. Bipolar I disorder, most recent episode depressed (HCC)  Start- QUEtiapine (SEROQUEL) 50 MG tablet; Take 1 tablet (50 mg total) by mouth at bedtime.  Dispense: 30 tablet; Refill: 3 Start- benztropine (COGENTIN) 0.5 MG tablet; Take 1 tablet (0.5 mg total) by mouth 2 (two) times daily.  Dispense: 60 tablet; Refill: 3 Continue- ARIPiprazole ER ([name] MAINTENA) 400 MG SRER injection; Inject 2 mLs (400 mg total) into the muscle every 30 (thirty) days.  2. Insomnia, unspecified type (Primary)  Restart- traZODone (DESYREL) 50 MG tablet; Take 1-2 tablets (50-100 mg total) by mouth at bedtime.  Dispense: 60 tablet; Refill: 3            Follow-up in 2.5 month for medication management Follow-up in 1 month for [name] injection Shanna Cisco, NP 02/21/2023, 9:43 AM

## 2023-03-21 ENCOUNTER — Ambulatory Visit (HOSPITAL_COMMUNITY): Payer: Medicare Other

## 2023-03-30 ENCOUNTER — Ambulatory Visit (HOSPITAL_COMMUNITY)

## 2023-04-06 ENCOUNTER — Ambulatory Visit (INDEPENDENT_AMBULATORY_CARE_PROVIDER_SITE_OTHER): Admitting: *Deleted

## 2023-04-06 ENCOUNTER — Encounter (HOSPITAL_COMMUNITY): Payer: Self-pay

## 2023-04-06 ENCOUNTER — Other Ambulatory Visit (HOSPITAL_COMMUNITY): Payer: Self-pay | Admitting: Psychiatry

## 2023-04-06 ENCOUNTER — Telehealth (HOSPITAL_COMMUNITY): Payer: Self-pay

## 2023-04-06 VITALS — BP 183/116 | HR 73 | Ht 71.5 in | Wt 273.0 lb

## 2023-04-06 DIAGNOSIS — F200 Paranoid schizophrenia: Secondary | ICD-10-CM

## 2023-04-06 DIAGNOSIS — F3130 Bipolar disorder, current episode depressed, mild or moderate severity, unspecified: Secondary | ICD-10-CM

## 2023-04-06 MED ORDER — ARIPIPRAZOLE ER 400 MG IM SRER: 400.0000 mg | INTRAMUSCULAR | 11 refills | Status: DC

## 2023-04-06 NOTE — Progress Notes (Cosign Needed)
 PATIENT ARRIVED FOR INJECTION IPiprazole ER ([name] MAINTENA) 400 MG TOLERATED INJECTION WELL IN RIGHT DELTOID PATIENT PLEASANT AS ALWAYS  NO AH/VH NOR AI/VI

## 2023-04-06 NOTE — Telephone Encounter (Signed)
 Pt owes a total of 1,300+ dollars to [place] for Copay for [name]. States they will not send another injection.

## 2023-04-07 ENCOUNTER — Telehealth (HOSPITAL_COMMUNITY): Payer: Self-pay

## 2023-04-07 NOTE — Telephone Encounter (Signed)
 Hello this patient called today asking in inquiry of a [place] discounted Bus fair or passes. As I tried to help him I didn't see any information on file relating to this. He is trying to get set up again I believe and would like a phone call about what time range you had originally faxed those papers relating to this "bus event". I told Pt you were out of the office and to give time for response.     JNL

## 2023-04-07 NOTE — Telephone Encounter (Signed)
 Patient called asking if a letter can be be filled out for discounted bus rides. Thanks

## 2023-04-11 NOTE — Telephone Encounter (Signed)
 Form review and filled out for patient.

## 2023-05-02 ENCOUNTER — Telehealth (HOSPITAL_COMMUNITY): Payer: Self-pay

## 2023-05-02 NOTE — Telephone Encounter (Signed)
 Reached out to [facility] Patient has not met 1,190 deductible and has medicaid part D. Pt has to meet this before re-admin stated by pharmacy tech.     Patient may need to get on patient Assistance.    JNL

## 2023-05-04 ENCOUNTER — Ambulatory Visit (HOSPITAL_COMMUNITY)

## 2023-05-04 ENCOUNTER — Other Ambulatory Visit (HOSPITAL_COMMUNITY): Payer: Self-pay

## 2023-05-04 ENCOUNTER — Encounter (HOSPITAL_COMMUNITY): Payer: Self-pay

## 2023-05-04 VITALS — BP 152/90 | HR 84 | Temp 98.0°F | Ht 66.0 in | Wt 264.0 lb

## 2023-05-04 DIAGNOSIS — F200 Paranoid schizophrenia: Secondary | ICD-10-CM

## 2023-05-04 DIAGNOSIS — F3130 Bipolar disorder, current episode depressed, mild or moderate severity, unspecified: Secondary | ICD-10-CM

## 2023-05-04 NOTE — Progress Notes (Cosign Needed)
 Patient in today for due [name]  Maintena 400 mg IM injection for when he could come in this week.  Patient denied any current auditory or visual hallucinations, no suicidal or homicidal ideations, plans, intent or expressed means to want to harm self or others at this time.  Discussed with patient his new deductible reported by [facility] [place] and patient agreed to contact them to follow up due to large bill of $1,190 at present.  Patient to obtain a sample today of medication as approved by Dr. Melisa Spray.  Patient's due [name]  Maintena 400 mg IM injection prepared as ordered and given to patient in preferred Right Deltoid area. Patient tolerated due injection without complaint of pain or discomfort and agreed to return in 4 weeks for next due injection and to see provider.  Patient reported he wanted to follow up with [place] about deductible bill before scheduling next appointment and agreed to call back to set this up once worked out.  Expressed to patient to make sure to stay on schedule and he agreed he would call back to do this.

## 2023-05-11 ENCOUNTER — Encounter (HOSPITAL_COMMUNITY): Payer: Medicare Other | Admitting: Psychiatry

## 2023-08-03 ENCOUNTER — Encounter (HOSPITAL_COMMUNITY): Payer: Self-pay | Admitting: Physician Assistant

## 2023-08-03 ENCOUNTER — Ambulatory Visit (HOSPITAL_COMMUNITY): Admitting: Physician Assistant

## 2023-08-03 DIAGNOSIS — F3130 Bipolar disorder, current episode depressed, mild or moderate severity, unspecified: Secondary | ICD-10-CM | POA: Diagnosis not present

## 2023-08-03 MED ORDER — ARIPIPRAZOLE 10 MG PO TABS: 10.0000 mg | ORAL_TABLET | Freq: Every day | ORAL | 2 refills | Status: DC

## 2023-08-03 NOTE — Progress Notes (Signed)
 BH MD/PA/NP OP Progress Note  08/03/2023 4:26 PM Chris Rogers  MRN:  [id number]  Chief Complaint:  Chief Complaint  Patient presents with   Follow-up   Medication Management   HPI:   Chris Rogers is a 48 year old male with a past psychiatric history significant for bipolar 1 disorder (most recent episode depressed) who presents to [facility] for follow-up and medication management.  Patient was last seen by Dr. Harl on 02/21/2023.  During his last encounter, patient was being managed on the following psychiatric medications:  Seroquel  50 mg at bedtime Cogentin  0.5 mg 2 times daily [name]  ER ([name]  Maintena) 400 mg injection every 30 days Trazodone  50 mg to 100 mg at bedtime Patient last received [name]  Maintena injection on 05/04/2023.  Patient reports that he would like to be placed on oral [name]  since he is unable to afford his [name]  Maintena injection.  It should be noted that patient's last injection was received on 05/04/2023 (see note).  Patient reports that he has not received any [name]  since his last injection.  He also notes that he has not been taking any of his medications as well.  Patient denies paranoia or auditory/visual hallucinations.  He further denies any recent changes in his mood.  He does report that he recently lost his son back in May.  Patient endorses depression and rates his depression a 5 out of 10 with 10 being most severe.  Patient endorses depressive episodes 3 days/week.  Patient endorses the following depressive symptoms: feelings of sadness and irritability.  Patient denies lack of motivation, decreased concentration, decreased energy, feelings of guilt/worthlessness, or hopelessness.  Patient further denies anxiety.  A PHQ-9 screen was performed with the patient scoring a 3.  A GAD-7 screen was also performed with the patient scoring a 3.  Patient is alert and oriented x 4, calm, cooperative, and fully engaged in  conversation during the encounter.  Patient endorses okay mood.  Patient exhibits depressed mood with appropriate affect.  Patient denies suicidal or homicidal ideations.  He further denies auditory or visual hallucinations and does not appear to be responding to internal/external stimuli.  Patient endorses good sleep and receives on average 9 hours of sleep per night.  Patient endorses fair appetite and eats on average 2 meals per day.  Patient denies alcohol consumption, tobacco use, or illicit drug use.  Visit Diagnosis:    ICD-10-CM   1. Bipolar I disorder, most recent episode depressed (HCC)  F31.30 ARIPiprazole  ([name] ) 10 MG tablet      Past Psychiatric History:  Bipolar 1 disorder (most recent episode depressed)  Past Medical History:  Past Medical History:  Diagnosis Date   Bipolar 1 disorder (HCC)    Diabetes mellitus, type II (HCC)    Hypertension     Past Surgical History:  Procedure Laterality Date   KNEE SURGERY      Family Psychiatric History:  Father - bipolar disorder Sister - depression Brother - schizophrenia  Family History:  Family History  Problem Relation Age of Onset   Hypertension Mother    Hyperlipidemia Mother     Social History:  Social History   Socioeconomic History   Marital status: Divorced    Spouse name: Not on file   Number of children: 1   Years of education: Not on file   Highest education level: Not on file  Occupational History   Not on file  Tobacco Use   Smoking status: Never  Smokeless tobacco: Never  Vaping Use   Vaping status: Never Used  Substance and Sexual Activity   Alcohol use: Not Currently   Drug use: Not Currently   Sexual activity: Yes    Partners: Female    Birth control/protection: Condom  Other Topics Concern   Not on file  Social History Narrative   Not on file   Social Drivers of Health   Financial Resource Strain: Not on file  Food Insecurity: No Food Insecurity (05/10/2021)   Received from  [facility]   Hunger Vital Sign    Within the past 12 months, you worried that your food would run out before you got the money to buy more.: Never true    Within the past 12 months, the food you bought just didn't last and you didn't have money to get more.: Never true  Transportation Needs: Not on file  Physical Activity: Not on file  Stress: Not on file  Social Connections: Unknown (05/10/2021)   Received from [facility]   Social Network    Social Network: Not on file    Allergies:  Allergies  Allergen Reactions   Invega [Paliperidone] Other (See Comments)    manic episode   Olanzapine  Other (See Comments)    Mania   Topamax [Topiramate] Other (See Comments)    Patient states glaucoma like symptoms and blindness    Metabolic Disorder Labs: Lab Results  Component Value Date   HGBA1C 6.3 (H) 06/29/2021   MPG 134 06/29/2021   MPG 139.85 06/22/2019   No results found for: PROLACTIN Lab Results  Component Value Date   CHOL 210 (H) 06/29/2021   TRIG 39 06/29/2021   HDL 60 06/29/2021   CHOLHDL 3.5 06/29/2021   VLDL 8 06/29/2021   LDLCALC 142 (H) 06/29/2021   LDLCALC 114 (H) 06/22/2019   Lab Results  Component Value Date   TSH 2.290 06/29/2021   TSH 2.897 06/22/2019    Therapeutic Level Labs: No results found for: LITHIUM No results found for: VALPROATE No results found for: CBMZ  Current Medications: Current Outpatient Medications  Medication Sig Dispense Refill   ARIPiprazole  ([name] ) 10 MG tablet Take 1 tablet (10 mg total) by mouth daily. Patient to take half tablet (5 mg total) for 6 days before taking full tablet. 30 tablet 2   amLODipine  (NORVASC ) 10 MG tablet Take 10 mg by mouth daily.     ARIPiprazole  ER ([name]  MAINTENA) 400 MG SRER injection Inject 2 mLs (400 mg total) into the muscle every 30 (thirty) days. 2 mL 11   atenolol (TENORMIN) 25 MG tablet Take 25 mg by mouth daily. (Patient not taking: Reported on 12/06/2022)      benztropine  (COGENTIN ) 0.5 MG tablet Take 1 tablet (0.5 mg total) by mouth 2 (two) times daily. 60 tablet 3   losartan  (COZAAR ) 100 MG tablet Take 100 mg by mouth daily. (Patient not taking: Reported on 12/06/2022)     QUEtiapine  (SEROQUEL ) 50 MG tablet Take 1 tablet (50 mg total) by mouth at bedtime. 30 tablet 3   traZODone  (DESYREL ) 50 MG tablet Take 1-2 tablets (50-100 mg total) by mouth at bedtime. 60 tablet 3   Current Facility-Administered Medications  Medication Dose Route Frequency Provider Last Rate Last Admin   ARIPiprazole  ER ([name]  MAINTENA) injection 400 mg  400 mg Intramuscular Q30 days Parsons, Brittney E, NP   400 mg at 05/04/23 1150     Musculoskeletal: Strength & Muscle Tone: within normal limits Gait & Station:  normal Patient leans: N/A  Psychiatric Specialty Exam: Review of Systems  Psychiatric/Behavioral:  Positive for dysphoric mood. Negative for decreased concentration, hallucinations, self-injury, sleep disturbance and suicidal ideas. The patient is not nervous/anxious and is not hyperactive.     Blood pressure (!) 163/113, pulse 75, temperature 98.1 F (36.7 C), temperature source Oral, height 5' 6 (1.676 m), weight 263 lb (119.3 kg), SpO2 99%.Body mass index is 42.45 kg/m.  General Appearance: Casual  Eye Contact:  Good  Speech:  Clear and Coherent and Normal Rate  Volume:  Normal  Mood:  Depressed  Affect:  Appropriate  Thought Process:  Coherent, Goal Directed, and Descriptions of Associations: Intact  Orientation:  Full (Time, Place, and Person)  Thought Content: WDL   Suicidal Thoughts:  No  Homicidal Thoughts:  No  Memory:  Immediate;   Good Recent;   Good Remote;   Fair  Judgement:  Good  Insight:  Good  Psychomotor Activity:  Normal  Concentration:  Concentration: Good and Attention Span: Good  Recall:  Good  Fund of Knowledge: Good  Language: Good  Akathisia:  No  Handed:  Right  AIMS (if indicated): done  Assets:  Communication  Skills Desire for Improvement Financial Resources/Insurance Housing Social Support  ADL's:  Intact  Cognition: WNL  Sleep:  Good   Screenings: AIMS    Flowsheet Row Clinical Support from 08/03/2023 in [facility] Most recent reading at 08/03/2023  3:18 PM Clinical Support from 02/21/2023 in [facility] Most recent reading at 02/21/2023  9:43 AM Clinical Support from 02/21/2023 in [facility] Most recent reading at 02/21/2023  9:12 AM  AIMS Total Score 0 0 0   GAD-7    Flowsheet Row Clinical Support from 08/03/2023 in [facility] Most recent reading at 08/03/2023  3:13 PM Clinical Support from 02/21/2023 in [facility] Most recent reading at 02/21/2023  9:14 AM Clinical Support from 02/21/2023 in [facility] Most recent reading at 02/21/2023  9:13 AM Video Visit from 11/30/2022 in [facility] Most recent reading at 11/30/2022 10:01 AM Office Visit from 09/07/2021 in [facility] Most recent reading at 09/07/2021 10:50 AM  Total GAD-7 Score 3 3 3 8 4    PHQ2-9    Flowsheet Row Clinical Support from 08/03/2023 in [facility] Most recent reading at 08/03/2023  3:11 PM Clinical Support from 02/21/2023 in [facility] Most recent reading at 02/21/2023  9:13 AM Clinical Support from 02/21/2023 in [facility] Most recent reading at 02/21/2023  9:11 AM Video Visit from 11/30/2022 in [facility] Most recent reading at 11/30/2022  9:59 AM Office Visit from 09/07/2021 in [facility] Most recent reading at 09/07/2021 10:51 AM  PHQ-2 Total Score 2 0 0 4 4  PHQ-9 Total Score 3 11 11 14 7    Flowsheet Row Clinical Support from 08/03/2023 in [facility] Most recent reading at 08/03/2023  3:13 PM Clinical Support from 02/21/2023 in [facility] Most recent reading at 02/21/2023  9:13 AM Clinical Support from 02/21/2023 in [facility] Most recent reading at 02/21/2023  9:11 AM  C-SSRS RISK CATEGORY Low Risk Error: Q3, 4, or 5 should not be populated when Q2 is No Error: Q3, 4, or 5 should not  be populated when Q2 is No     Assessment and Plan:   Chris Rogers is a 48 year old male with a past psychiatric history significant for bipolar 1 disorder (most recent episode depressed) who presents to [facility] for follow-up and medication management.  Patient presents to the encounter requesting to be placed on oral [name]  due to being able to afford [name]  Maintena.  Patient last received [name]  Maintena on 05/04/2023 (see note).  Since being without [name]  Maintena, patient denies changes in his behavior nor does he endorse auditory or visual hallucinations.  Patient does continue to endorse some depression stating that he recently lost his son back in May.  Patient denies anxiety.  A PHQ-9 screen was performed with the patient scoring a 3.  A GAD-7 screen was also performed with the patient scoring a 3.  Since receiving his last injection, patient reports that he has not been taking any of his medications at this time.  Patient wishes to be placed on oral [name]  following the conclusion of the encounter.  Patient does not want to be placed on any of his other previous psychiatric medications.  Provider recommended patient be placed on [name]  5 mg daily for 6 days, followed by 10 mg daily for mood stability.  Patient was agreeable to recommendation.  Patient's medication to be e-prescribed to pharmacy of choice.  Collaboration of Care: Collaboration of Care: Medication Management AEB provider managing patient's psychiatric  medication and Psychiatrist AEB patient being followed by a mental health provider  Patient/Guardian was advised Release of Information must be obtained prior to any record release in order to collaborate their care with an outside provider. Patient/Guardian was advised if they have not already done so to contact the registration department to sign all necessary forms in order for us  to release information regarding their care.   Consent: Patient/Guardian gives verbal consent for treatment and assignment of benefits for services provided during this visit. Patient/Guardian expressed understanding and agreed to proceed.   1. Bipolar I disorder, most recent episode depressed (HCC)  - ARIPiprazole  ([name] ) 10 MG tablet; Take 1 tablet (10 mg total) by mouth daily. Patient to take half tablet (5 mg total) for 6 days before taking full tablet.  Dispense: 30 tablet; Refill: 2  Patient informed provider that he is moving outside of [facility]), and will not need a follow-up appointment.  Patient plans on establishing psychiatric care and [facility]. Patient to be provided refills following the conclusion of the encounter. Provider spent a total of 15 minutes with the patient/reviewing patient's chart.  Reginia FORBES Bolster, PA 08/03/2023, 4:26 PM

## 2023-09-11 ENCOUNTER — Other Ambulatory Visit (HOSPITAL_COMMUNITY)
Admission: EM | Admit: 2023-09-11 | Discharge: 2023-09-13 | Disposition: A | Discharge status: Other Acute Inpatient Hospital | Source: Intra-hospital | Attending: Psychiatry | Admitting: Psychiatry

## 2023-09-11 ENCOUNTER — Ambulatory Visit (HOSPITAL_COMMUNITY)
Admission: EM | Admit: 2023-09-11 | Discharge: 2023-09-11 | Disposition: A | Discharge status: Home / Self Care | Attending: Family Medicine | Admitting: Family Medicine

## 2023-09-11 DIAGNOSIS — Z79899 Other long term (current) drug therapy: Secondary | ICD-10-CM | POA: Insufficient documentation

## 2023-09-11 DIAGNOSIS — S0181XA Laceration without foreign body of other part of head, initial encounter: Secondary | ICD-10-CM

## 2023-09-11 DIAGNOSIS — F250 Schizoaffective disorder, bipolar type: Secondary | ICD-10-CM | POA: Diagnosis present

## 2023-09-11 DIAGNOSIS — F3130 Bipolar disorder, current episode depressed, mild or moderate severity, unspecified: Secondary | ICD-10-CM

## 2023-09-11 DIAGNOSIS — R45850 Homicidal ideations: Secondary | ICD-10-CM | POA: Insufficient documentation

## 2023-09-11 DIAGNOSIS — E785 Hyperlipidemia, unspecified: Secondary | ICD-10-CM | POA: Insufficient documentation

## 2023-09-11 DIAGNOSIS — F419 Anxiety disorder, unspecified: Secondary | ICD-10-CM | POA: Diagnosis not present

## 2023-09-11 DIAGNOSIS — I10 Essential (primary) hypertension: Secondary | ICD-10-CM

## 2023-09-11 DIAGNOSIS — Z634 Disappearance and death of family member: Secondary | ICD-10-CM | POA: Insufficient documentation

## 2023-09-11 DIAGNOSIS — Z599 Problem related to housing and economic circumstances, unspecified: Secondary | ICD-10-CM | POA: Diagnosis not present

## 2023-09-11 DIAGNOSIS — F4381 Prolonged grief disorder: Secondary | ICD-10-CM

## 2023-09-11 DIAGNOSIS — Z630 Problems in relationship with spouse or partner: Secondary | ICD-10-CM | POA: Insufficient documentation

## 2023-09-11 DIAGNOSIS — R45851 Suicidal ideations: Secondary | ICD-10-CM | POA: Insufficient documentation

## 2023-09-11 DIAGNOSIS — F4321 Adjustment disorder with depressed mood: Secondary | ICD-10-CM

## 2023-09-11 DIAGNOSIS — F251 Schizoaffective disorder, depressive type: Secondary | ICD-10-CM | POA: Insufficient documentation

## 2023-09-11 LAB — HEMOGLOBIN A1C
Hgb A1c MFr Bld: 6.5 % — ABNORMAL HIGH (ref 4.8–5.6)
Mean Plasma Glucose: 139.85 mg/dL

## 2023-09-11 LAB — COMPREHENSIVE METABOLIC PANEL WITH GFR
ALT: 26 U/L (ref 0–44)
AST: 28 U/L (ref 15–41)
Albumin: 4.3 g/dL (ref 3.5–5.0)
Alkaline Phosphatase: 80 U/L (ref 38–126)
Anion gap: 12 (ref 5–15)
BUN: 11 mg/dL (ref 6–20)
CO2: 22 mmol/L (ref 22–32)
Calcium: 9.6 mg/dL (ref 8.9–10.3)
Chloride: 98 mmol/L (ref 98–111)
Creatinine, Ser: 1.11 mg/dL (ref 0.61–1.24)
GFR, Estimated: >60 mL/min (ref >60)
Glucose, Bld: 102 mg/dL — ABNORMAL HIGH (ref 70–99)
Potassium: 3.7 mmol/L (ref 3.5–5.1)
Sodium: 132 mmol/L — ABNORMAL LOW (ref 135–145)
Total Bilirubin: 1.2 mg/dL (ref 0.0–1.2)
Total Protein: 7.8 g/dL (ref 6.5–8.1)

## 2023-09-11 LAB — CBC WITH DIFFERENTIAL/PLATELET
Abs Immature Granulocytes: 0.02 K/uL (ref 0.00–0.07)
Basophils Absolute: 0 K/uL (ref 0.0–0.1)
Basophils Relative: 1 %
Eosinophils Absolute: 0.1 K/uL (ref 0.0–0.5)
Eosinophils Relative: 2 %
HCT: 47.3 % (ref 39.0–52.0)
Hemoglobin: 15.6 g/dL (ref 13.0–17.0)
Immature Granulocytes: 0 %
Lymphocytes Relative: 22 %
Lymphs Abs: 1.6 K/uL (ref 0.7–4.0)
MCH: 26.1 pg (ref 26.0–34.0)
MCHC: 33 g/dL (ref 30.0–36.0)
MCV: 79.2 fL — ABNORMAL LOW (ref 80.0–100.0)
Monocytes Absolute: 0.5 K/uL (ref 0.1–1.0)
Monocytes Relative: 7 %
Neutro Abs: 4.9 K/uL (ref 1.7–7.7)
Neutrophils Relative %: 68 %
Platelets: 263 K/uL (ref 150–400)
RBC: 5.97 MIL/uL — ABNORMAL HIGH (ref 4.22–5.81)
RDW: 14 % (ref 11.5–15.5)
WBC: 7.2 K/uL (ref 4.0–10.5)
nRBC: 0 % (ref 0.0–0.2)

## 2023-09-11 LAB — LIPID PANEL
Cholesterol: 207 mg/dL — ABNORMAL HIGH (ref 0–200)
HDL: 60 mg/dL (ref >40)
LDL Cholesterol: 138 mg/dL — ABNORMAL HIGH (ref 0–99)
Total CHOL/HDL Ratio: 3.5 ratio
Triglycerides: 46 mg/dL (ref <150)
VLDL: 9 mg/dL (ref 0–40)

## 2023-09-11 LAB — POCT URINE DRUG SCREEN - MANUAL ENTRY (I-SCREEN)
POC Amphetamine UR: NOT DETECTED
POC Buprenorphine (BUP): NOT DETECTED
POC Cocaine UR: NOT DETECTED
POC Marijuana UR: NOT DETECTED
POC Methadone UR: NOT DETECTED
POC Methamphetamine UR: NOT DETECTED
POC Morphine: NOT DETECTED
POC Oxazepam (BZO): NOT DETECTED
POC Oxycodone UR: NOT DETECTED
POC Secobarbital (BAR): NOT DETECTED

## 2023-09-11 LAB — TSH: TSH: 2.117 u[IU]/mL (ref 0.350–4.500)

## 2023-09-11 MED ORDER — MAGNESIUM HYDROXIDE 400 MG/5ML PO SUSP: 30.0000 mL | Freq: Every day | ORAL | Status: DC | PRN

## 2023-09-11 MED ORDER — LORAZEPAM 1 MG PO TABS
2.0000 mg | ORAL_TABLET | ORAL | Status: AC
Start: 2023-09-11 — End: 2023-09-11
  Administered 2023-09-11: 2 mg via ORAL
  Filled 2023-09-11: qty 2

## 2023-09-11 MED ORDER — HALOPERIDOL LACTATE 5 MG/ML IJ SOLN: 10.0000 mg | Freq: Three times a day (TID) | INTRAMUSCULAR | Status: DC | PRN

## 2023-09-11 MED ORDER — DIPHENHYDRAMINE HCL 50 MG PO CAPS
50.0000 mg | ORAL_CAPSULE | Freq: Three times a day (TID) | ORAL | Status: DC | PRN
  Administered 2023-09-12 – 2023-09-13 (×2): 50 mg via ORAL
  Filled 2023-09-11 (×2): qty 1

## 2023-09-11 MED ORDER — HALOPERIDOL LACTATE 5 MG/ML IJ SOLN: 5.0000 mg | Freq: Three times a day (TID) | INTRAMUSCULAR | Status: DC | PRN

## 2023-09-11 MED ORDER — LORAZEPAM 2 MG/ML IJ SOLN
2.0000 mg | Freq: Three times a day (TID) | INTRAMUSCULAR | Status: DC | PRN
  Administered 2023-09-13: 2 mg via INTRAMUSCULAR

## 2023-09-11 MED ORDER — ARIPIPRAZOLE 10 MG PO TABS: 10.0000 mg | ORAL_TABLET | Freq: Two times a day (BID) | ORAL | Status: DC

## 2023-09-11 MED ORDER — ALUM & MAG HYDROXIDE-SIMETH 200-200-20 MG/5ML PO SUSP: 30.0000 mL | ORAL | Status: DC | PRN

## 2023-09-11 MED ORDER — LORAZEPAM 2 MG/ML IJ SOLN: 2.0000 mg | Freq: Three times a day (TID) | INTRAMUSCULAR | Status: DC | PRN

## 2023-09-11 MED ORDER — DIPHENHYDRAMINE HCL 50 MG/ML IJ SOLN: 50.0000 mg | Freq: Three times a day (TID) | INTRAMUSCULAR | Status: DC | PRN

## 2023-09-11 MED ORDER — LORAZEPAM 1 MG PO TABS
2.0000 mg | ORAL_TABLET | ORAL | Status: DC
Start: 2023-09-11 — End: 2023-09-11

## 2023-09-11 MED ORDER — AMLODIPINE BESYLATE 5 MG PO TABS
5.0000 mg | ORAL_TABLET | Freq: Every day | ORAL | Status: DC
  Administered 2023-09-11 – 2023-09-13 (×3): 5 mg via ORAL
  Filled 2023-09-11 (×3): qty 1

## 2023-09-11 MED ORDER — ACETAMINOPHEN 325 MG PO TABS: 650.0000 mg | ORAL_TABLET | Freq: Four times a day (QID) | ORAL | Status: DC | PRN

## 2023-09-11 MED ORDER — TRAZODONE HCL 100 MG PO TABS: 100.0000 mg | ORAL_TABLET | Freq: Every day | ORAL | Status: DC

## 2023-09-11 MED ORDER — DIPHENHYDRAMINE HCL 50 MG PO CAPS: 50.0000 mg | ORAL_CAPSULE | Freq: Three times a day (TID) | ORAL | Status: DC | PRN

## 2023-09-11 MED ORDER — LORAZEPAM 2 MG/ML IJ SOLN
2.0000 mg | Freq: Three times a day (TID) | INTRAMUSCULAR | Status: DC | PRN
  Filled 2023-09-11: qty 1

## 2023-09-11 MED ORDER — BACITRACIN ZINC 500 UNIT/GM EX OINT
1.0000 | TOPICAL_OINTMENT | Freq: Two times a day (BID) | CUTANEOUS | Status: DC
Start: 2023-09-11 — End: 2023-09-11

## 2023-09-11 MED ORDER — HALOPERIDOL 5 MG PO TABS
5.0000 mg | ORAL_TABLET | Freq: Three times a day (TID) | ORAL | Status: DC | PRN
  Administered 2023-09-12 – 2023-09-13 (×2): 5 mg via ORAL
  Filled 2023-09-11 (×2): qty 1

## 2023-09-11 MED ORDER — ARIPIPRAZOLE 10 MG PO TABS
10.0000 mg | ORAL_TABLET | Freq: Two times a day (BID) | ORAL | Status: DC
  Administered 2023-09-11 – 2023-09-12 (×2): 10 mg via ORAL
  Filled 2023-09-11 (×2): qty 1

## 2023-09-11 MED ORDER — HALOPERIDOL 5 MG PO TABS: 5.0000 mg | ORAL_TABLET | Freq: Three times a day (TID) | ORAL | Status: DC | PRN

## 2023-09-11 MED ORDER — HYDROXYZINE HCL 25 MG PO TABS: 25.0000 mg | ORAL_TABLET | Freq: Three times a day (TID) | ORAL | Status: DC | PRN

## 2023-09-11 MED ORDER — TRAZODONE HCL 100 MG PO TABS
100.0000 mg | ORAL_TABLET | Freq: Every day | ORAL | Status: DC
  Administered 2023-09-11 – 2023-09-12 (×2): 100 mg via ORAL
  Filled 2023-09-11 (×2): qty 1

## 2023-09-11 MED ORDER — ATENOLOL 25 MG PO TABS: 25.0000 mg | ORAL_TABLET | Freq: Every day | ORAL | Status: DC

## 2023-09-11 MED ORDER — HYDROXYZINE HCL 25 MG PO TABS
25.0000 mg | ORAL_TABLET | Freq: Three times a day (TID) | ORAL | Status: DC | PRN
  Administered 2023-09-12 – 2023-09-13 (×2): 25 mg via ORAL
  Filled 2023-09-11 (×2): qty 1

## 2023-09-11 MED ORDER — ATENOLOL 25 MG PO TABS
25.0000 mg | ORAL_TABLET | Freq: Every day | ORAL | Status: DC
  Administered 2023-09-12 – 2023-09-13 (×2): 25 mg via ORAL
  Filled 2023-09-11 (×2): qty 1

## 2023-09-11 MED ORDER — BACITRACIN ZINC 500 UNIT/GM EX OINT
1.0000 | TOPICAL_OINTMENT | Freq: Two times a day (BID) | CUTANEOUS | Status: AC
Start: 2023-09-11 — End: 2023-09-13
  Administered 2023-09-11 – 2023-09-12 (×3): 1 via TOPICAL
  Filled 2023-09-11 (×2): qty 28.35

## 2023-09-11 NOTE — Progress Notes (Signed)
 Pt is transferred to [facility]

## 2023-09-11 NOTE — ED Notes (Signed)
 Pt observed and assessed at the nursing station. He reports he takes amlopdine and losartan  daily. Per notes, he stopped taking the losartan  but he says she still takes them for his BP. The last time he took them was yesterday according to pt. this evening manual BP is 140/90.  NP Chinwendu notified and amlodipine  10 mg ordered. Pt notified.
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
## 2023-09-11 NOTE — Progress Notes (Signed)
 Pt has been accepted to [facility] on 09/11/2023 . Bed assignment:155  Pt meets inpatient criteria per Suzen Lesches, NP   Care Team Notified: [facility] Burnard Barter, RN, Suzen Lesches, NP
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
## 2023-09-11 NOTE — Discharge Instructions (Signed)
[facility]
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
## 2023-09-11 NOTE — Group Note (Signed)
 Group Topic: Social Support  Group Date: 09/11/2023 Start Time: 2015 End Time: 2045 Facilitators: Tanda Ogren D, NT  Department: [facility]  Number of Participants: 5  Group Focus: daily focus Treatment Modality:  Psychoeducation Interventions utilized were support Purpose: express feelings  Name: Chris Rogers Date of Birth: Jun 04, 1975  MR: [id number]    Level of Participation: moderate Quality of Participation: attentive Interactions with others: gave feedback Mood/Affect: appropriate Triggers (if applicable): n/a Cognition: coherent/clear Progress: Significant Response: n/a Plan: follow-up needed  Patients Problems:  Patient Active Problem List   Diagnosis Date Noted   Schizoaffective disorder, mixed type (HCC) 09/11/2023   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

## 2023-09-12 DIAGNOSIS — I10 Essential (primary) hypertension: Secondary | ICD-10-CM

## 2023-09-12 DIAGNOSIS — S0181XA Laceration without foreign body of other part of head, initial encounter: Secondary | ICD-10-CM

## 2023-09-12 DIAGNOSIS — R45851 Suicidal ideations: Secondary | ICD-10-CM

## 2023-09-12 DIAGNOSIS — F4321 Adjustment disorder with depressed mood: Secondary | ICD-10-CM

## 2023-09-12 DIAGNOSIS — F250 Schizoaffective disorder, bipolar type: Secondary | ICD-10-CM | POA: Diagnosis not present

## 2023-09-12 MED ORDER — BENZTROPINE MESYLATE 0.5 MG PO TABS
0.5000 mg | ORAL_TABLET | Freq: Two times a day (BID) | ORAL | Status: AC
  Administered 2023-09-12: 0.5 mg via ORAL
  Filled 2023-09-12: qty 1

## 2023-09-12 MED ORDER — HALOPERIDOL DECANOATE 100 MG/ML IM SOLN
100.0000 mg | INTRAMUSCULAR | Status: AC
Start: 2023-09-12 — End: 2023-09-12
  Administered 2023-09-12: 100 mg via INTRAMUSCULAR
  Filled 2023-09-12: qty 1

## 2023-09-12 NOTE — ED Notes (Signed)
 PRN Haldol  and Benadryl  given due to patient reports of increasing agitation and irritability. Patient observed with verbal outbursts on the phone with spouse during which he slammed the phone down. Medication administered with no complications. Environment secured, safety checks in place per facility policy.

## 2023-09-12 NOTE — ED Notes (Signed)
 Pt stated he wanted some more night time meds to help him sleep. Pt is up at the nursing station wanting to read a book.

## 2023-09-12 NOTE — ED Notes (Signed)
 Patient alert & oriented x4. Denies intent to harm self or others when asked. Denies A/VH. Patient denies any physical complaints when asked. No acute distress noted. Scheduled medications administered with no complications. Support and encouragement provided. Patient observed in milieu. No inappropriate behaviors observed or reported. Patient pleasant in demeanor, very talkative. Routine safety checks conducted per facility protocol. Encouraged patient to notify staff if any thoughts of harm towards self or others arise. Patient verbalizes understanding and agreement.

## 2023-09-12 NOTE — ED Notes (Signed)
 Patient is in the bedroom sleeping NAD. Will continue to monitor for safety.

## 2023-09-12 NOTE — ED Notes (Signed)
 Patient sitting in dayroom interacting with peers. No acute distress noted. No concerns voiced. No inappropriate behaviors observed or reported at this time. Informed patient to notify staff with any needs or assistance. Patient verbalized understanding or agreement. Safety checks in place per facility policy.

## 2023-09-12 NOTE — ED Provider Notes (Signed)
/  Treatment Change: Patient is unable to afford [name]  Maintena and patient wishes to change treatment to Haldol . Dicussed during treatment team the following: Discontinue [name]  now Start Haldol  Decanoate 100 mg  Start Haldol  10 mg at bedtime Continue Trazodone  100 mg at bedtime Continue Hydroxyzine  25 mg TID PRN   Patient has signed a 72 hour noticed to discharge to D/C 09/14/2023 Patient will be seen during rounds tomorrow.
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
## 2023-09-12 NOTE — Group Note (Signed)
 Group Topic: Emotional Regulation  Group Date: 09/12/2023 Start Time: 1300 End Time: 1325 Facilitators: Lonzell Dwayne RAMAN, NT  Department: [facility]  Number of Participants: 3  Group Focus: anger management Treatment Modality:  Cognitive Behavioral Therapy Interventions utilized were patient education Purpose: explore maladaptive thinking  Name: [name] Date of Birth: 04-11-1975  MR: [id number]    Level of Participation: Patient attended group Quality of Participation: When qued Interactions with others: no real interaction Mood/Affect: closed / guarded Triggers (if applicable): N/A Cognition: not focused Progress: Gaining insight Response: no real response Plan: follow-up needed  Patients Problems:  Patient Active Problem List   Diagnosis Date Noted   Schizoaffective disorder, mixed type (HCC) 09/11/2023   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

## 2023-09-12 NOTE — ED Notes (Signed)
 Pt is sleeping. No acute distress noted.

## 2023-09-12 NOTE — Group Note (Signed)
 Group Topic: Relapse and Recovery  Group Date: 09/12/2023 Start Time: 1945 End Time: 2045 Facilitators: Joan Plowman B  Department: [facility]  Number of Participants: 4  Group Focus: substance abuse education Treatment Modality:  Spiritual Interventions utilized were support Purpose: relapse prevention strategies and trigger / craving management  Name: Chris Rogers Date of Birth: 26-Jan-1975  MR: [id number]    Level of Participation: active Quality of Participation: cooperative Interactions with others: gave feedback Mood/Affect: appropriate Triggers (if applicable): NA Cognition: coherent/clear Progress: Gaining insight Response: NA Plan: patient will be encouraged to keep going to groups  Patients Problems:  Patient Active Problem List   Diagnosis Date Noted   Schizoaffective disorder, mixed type (HCC) 09/11/2023   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

## 2023-09-13 ENCOUNTER — Encounter (HOSPITAL_COMMUNITY): Payer: Self-pay | Admitting: Psychiatry

## 2023-09-13 ENCOUNTER — Other Ambulatory Visit: Payer: Self-pay

## 2023-09-13 ENCOUNTER — Inpatient Hospital Stay (HOSPITAL_COMMUNITY)
Admission: AD | Admit: 2023-09-13 | Discharge: 2023-09-16 | DRG: 885 | Disposition: A | Discharge status: Home / Self Care | Source: Intra-hospital | Attending: Student in an Organized Health Care Education/Training Program | Admitting: Student in an Organized Health Care Education/Training Program

## 2023-09-13 DIAGNOSIS — F251 Schizoaffective disorder, depressive type: Secondary | ICD-10-CM | POA: Diagnosis present

## 2023-09-13 DIAGNOSIS — G4700 Insomnia, unspecified: Secondary | ICD-10-CM | POA: Diagnosis present

## 2023-09-13 DIAGNOSIS — Z888 Allergy status to other drugs, medicaments and biological substances status: Secondary | ICD-10-CM | POA: Diagnosis not present

## 2023-09-13 DIAGNOSIS — E119 Type 2 diabetes mellitus without complications: Secondary | ICD-10-CM | POA: Diagnosis present

## 2023-09-13 DIAGNOSIS — F4381 Prolonged grief disorder: Secondary | ICD-10-CM | POA: Diagnosis present

## 2023-09-13 DIAGNOSIS — I10 Essential (primary) hypertension: Secondary | ICD-10-CM | POA: Diagnosis present

## 2023-09-13 DIAGNOSIS — Z8249 Family history of ischemic heart disease and other diseases of the circulatory system: Secondary | ICD-10-CM | POA: Diagnosis not present

## 2023-09-13 DIAGNOSIS — Z83438 Family history of other disorder of lipoprotein metabolism and other lipidemia: Secondary | ICD-10-CM | POA: Diagnosis not present

## 2023-09-13 DIAGNOSIS — F419 Anxiety disorder, unspecified: Secondary | ICD-10-CM | POA: Diagnosis present

## 2023-09-13 DIAGNOSIS — F250 Schizoaffective disorder, bipolar type: Secondary | ICD-10-CM | POA: Diagnosis present

## 2023-09-13 DIAGNOSIS — R45851 Suicidal ideations: Secondary | ICD-10-CM | POA: Diagnosis present

## 2023-09-13 DIAGNOSIS — Z6841 Body Mass Index (BMI) 40.0 and over, adult: Secondary | ICD-10-CM | POA: Diagnosis not present

## 2023-09-13 DIAGNOSIS — E785 Hyperlipidemia, unspecified: Secondary | ICD-10-CM | POA: Diagnosis present

## 2023-09-13 DIAGNOSIS — Z634 Disappearance and death of family member: Secondary | ICD-10-CM

## 2023-09-13 DIAGNOSIS — Z79899 Other long term (current) drug therapy: Secondary | ICD-10-CM | POA: Diagnosis not present

## 2023-09-13 DIAGNOSIS — R197 Diarrhea, unspecified: Secondary | ICD-10-CM | POA: Diagnosis present

## 2023-09-13 DIAGNOSIS — Z8572 Personal history of non-Hodgkin lymphomas: Secondary | ICD-10-CM

## 2023-09-13 MED ORDER — HALOPERIDOL LACTATE 5 MG/ML IJ SOLN
10.0000 mg | Freq: Three times a day (TID) | INTRAMUSCULAR | Status: DC | PRN
  Filled 2023-09-13: qty 2

## 2023-09-13 MED ORDER — ALUM & MAG HYDROXIDE-SIMETH 200-200-20 MG/5ML PO SUSP
30.0000 mL | ORAL | Status: DC | PRN
  Administered 2023-09-15 – 2023-09-16 (×2): 30 mL via ORAL
  Filled 2023-09-13 (×2): qty 30

## 2023-09-13 MED ORDER — ATENOLOL 25 MG PO TABS
25.0000 mg | ORAL_TABLET | Freq: Every day | ORAL | Status: DC
  Administered 2023-09-14 – 2023-09-16 (×3): 25 mg via ORAL
  Filled 2023-09-13 (×3): qty 1

## 2023-09-13 MED ORDER — DIPHENHYDRAMINE HCL 50 MG/ML IJ SOLN
50.0000 mg | Freq: Three times a day (TID) | INTRAMUSCULAR | Status: DC | PRN
  Administered 2023-09-15: 50 mg via INTRAMUSCULAR
  Filled 2023-09-13: qty 1

## 2023-09-13 MED ORDER — LOPERAMIDE HCL 2 MG PO CAPS
2.0000 mg | ORAL_CAPSULE | Freq: Two times a day (BID) | ORAL | Status: DC | PRN
  Administered 2023-09-13 – 2023-09-15 (×5): 2 mg via ORAL
  Filled 2023-09-13 (×6): qty 1

## 2023-09-13 MED ORDER — HALOPERIDOL LACTATE 5 MG/ML IJ SOLN
5.0000 mg | Freq: Three times a day (TID) | INTRAMUSCULAR | Status: DC | PRN
  Administered 2023-09-15: 5 mg via INTRAMUSCULAR
  Filled 2023-09-13: qty 1

## 2023-09-13 MED ORDER — HYDROXYZINE HCL 25 MG PO TABS
25.0000 mg | ORAL_TABLET | Freq: Three times a day (TID) | ORAL | Status: DC | PRN
  Administered 2023-09-13 – 2023-09-16 (×6): 25 mg via ORAL
  Filled 2023-09-13 (×6): qty 1

## 2023-09-13 MED ORDER — HALOPERIDOL 5 MG PO TABS
5.0000 mg | ORAL_TABLET | Freq: Three times a day (TID) | ORAL | Status: DC | PRN
  Administered 2023-09-14 – 2023-09-15 (×2): 5 mg via ORAL
  Filled 2023-09-13 (×2): qty 1

## 2023-09-13 MED ORDER — ACETAMINOPHEN 325 MG PO TABS
650.0000 mg | ORAL_TABLET | Freq: Four times a day (QID) | ORAL | Status: DC | PRN
  Administered 2023-09-13 – 2023-09-15 (×2): 650 mg via ORAL
  Filled 2023-09-13 (×2): qty 2

## 2023-09-13 MED ORDER — LORAZEPAM 2 MG/ML IJ SOLN
2.0000 mg | Freq: Three times a day (TID) | INTRAMUSCULAR | Status: DC | PRN
  Administered 2023-09-15: 2 mg via INTRAMUSCULAR
  Filled 2023-09-13 (×2): qty 1

## 2023-09-13 MED ORDER — DIPHENHYDRAMINE HCL 50 MG/ML IJ SOLN
50.0000 mg | Freq: Three times a day (TID) | INTRAMUSCULAR | Status: DC | PRN
  Filled 2023-09-13: qty 1

## 2023-09-13 MED ORDER — CLONIDINE HCL 0.1 MG PO TABS
0.1000 mg | ORAL_TABLET | Freq: Once | ORAL | Status: AC
  Administered 2023-09-13: 0.1 mg via ORAL
  Filled 2023-09-13: qty 1

## 2023-09-13 MED ORDER — DIPHENHYDRAMINE HCL 25 MG PO CAPS
50.0000 mg | ORAL_CAPSULE | Freq: Three times a day (TID) | ORAL | Status: DC | PRN
  Administered 2023-09-14 – 2023-09-15 (×2): 50 mg via ORAL
  Filled 2023-09-13 (×2): qty 2

## 2023-09-13 MED ORDER — HALOPERIDOL 5 MG PO TABS
5.0000 mg | ORAL_TABLET | Freq: Two times a day (BID) | ORAL | Status: DC
  Administered 2023-09-13 – 2023-09-15 (×4): 5 mg via ORAL
  Filled 2023-09-13 (×4): qty 1

## 2023-09-13 MED ORDER — AMLODIPINE BESYLATE 5 MG PO TABS
5.0000 mg | ORAL_TABLET | Freq: Every day | ORAL | Status: DC
  Administered 2023-09-14 – 2023-09-16 (×3): 5 mg via ORAL
  Filled 2023-09-13 (×3): qty 1

## 2023-09-13 MED ORDER — TRAZODONE HCL 100 MG PO TABS
100.0000 mg | ORAL_TABLET | Freq: Every day | ORAL | Status: DC
  Administered 2023-09-13 – 2023-09-15 (×3): 100 mg via ORAL
  Filled 2023-09-13 (×3): qty 1

## 2023-09-13 MED ORDER — LOPERAMIDE HCL 2 MG PO CAPS
2.0000 mg | ORAL_CAPSULE | Freq: Two times a day (BID) | ORAL | Status: DC | PRN
  Administered 2023-09-13 (×2): 2 mg via ORAL
  Filled 2023-09-13 (×2): qty 1

## 2023-09-13 MED ORDER — MAGNESIUM HYDROXIDE 400 MG/5ML PO SUSP: 30.0000 mL | Freq: Every day | ORAL | Status: DC | PRN

## 2023-09-13 MED ORDER — MIRTAZAPINE 7.5 MG PO TABS
7.5000 mg | ORAL_TABLET | Freq: Every day | ORAL | Status: DC
  Administered 2023-09-13 – 2023-09-15 (×3): 7.5 mg via ORAL
  Filled 2023-09-13 (×3): qty 1

## 2023-09-13 MED ORDER — LORAZEPAM 2 MG/ML IJ SOLN: 2.0000 mg | Freq: Three times a day (TID) | INTRAMUSCULAR | Status: DC | PRN

## 2023-09-13 NOTE — Progress Notes (Signed)
 Pt has been accepted to [facility] on 09/13/2023 Bed assignment: 403-02  Pt meets inpatient criteria per: Donia Snell NP  Attending Physician will be: Dr. Raliegh MD  Report can be called to: Adult unit: [phone]  Pt can arrive after pending VOL consent   Care Team Notified: [facility] Cherylynn Ernst RN, Donia Creighton NP, Zaira Mitchell RN  [place] Julene Rahn LCSW-A   09/13/2023 11:35 AM

## 2023-09-13 NOTE — ED Notes (Signed)
 Patient A&Ox4. Denies intent to harm self/others when asked. Denies A/VH. Patient c/o diarrhea. Sx relief medications provided. Pt received Clonidine  for increased BP 149/102, per provider order. Routine safety checks conducted according to facility protocol. Encouraged patient to notify staff if thoughts of harm toward self or others arise. Patient verbalize understanding and agreement. Pt came to desk requesting to discharge today instead of tomorrow. He said he changed his mind and has things to take care of. Providers made aware of pt request. Will continue to monitor for safety.

## 2023-09-13 NOTE — ED Notes (Signed)
 Pt is stating he is starting to see things and its getting worse. RN made aware. Pt asked for meds to help.

## 2023-09-13 NOTE — Progress Notes (Signed)
   09/13/23 1715  Psych Admission Type (Psych Patients Only)  Admission Status Voluntary  Psychosocial Assessment  Patient Complaints Anxiety  Eye Contact Fair  Facial Expression Flat  Affect Appropriate to circumstance  Speech Logical/coherent  Interaction Assertive  Motor Activity Slow  Appearance/Hygiene In scrubs  Behavior Characteristics Cooperative  Mood Pleasant  Thought Process  Coherency WDL  Content WDL  Delusions None reported or observed  Perception WDL  Hallucination None reported or observed  Judgment WDL  Confusion None  Danger to Self  Current suicidal ideation? Denies  Agreement Not to Harm Self Yes  Description of Agreement verbal  Danger to Others  Danger to Others None reported or observed

## 2023-09-13 NOTE — [facility] Group Notes (Signed)
 Spirituality Group   Description: Participant directed exploration of values, beliefs and meaning  **Included naming gifts, goals, and intention setting; fostering self-compassion  Following a brief framework of chaplain's role and ground rules of group behavior, participants are invited to share concerns or questions that engage spiritual life. Emphasis placed on common themes and shared experiences and ways to make meaning and clarify living into one's values.   Theory/Process/Goal: Utilize the theoretical framework of group therapy established by Celena Kite, Relational Cultural Theory and Rogerian approaches to facilitate relational empathy and use of the "here and now" to foster reflection, self-awareness, and sharing.   Observations: Chris Rogers was energized and very engaged in the group discussion. He expressed goals for himself and fostered mutual empathy with peer.  Chris Rogers L. Delores HERO.Div
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
## 2023-09-13 NOTE — ED Notes (Signed)
 Patient transferred to [facility] per MD order. Patient transferred in no acute distress, A& O x4 and ambulatory. Patient denied SI/HI, A/VH upon discharge. Patient verbalized understanding of admission. Patient mood fair. Patient belongings returned to patient from locker #23 complete and intact. Patient escorted to back [place] via staff for transport to destination. Safety maintained.

## 2023-09-13 NOTE — ED Notes (Signed)
 Pt approached nurses station requesting medication for anxiety. Pt received Vistaril  for sx mgmt. Pt currently in room resting. Safety maintained.

## 2023-09-13 NOTE — ED Notes (Signed)
 Pt pacing, hallucinating, rambling speech, stating he is unable to sleep.  PRN given.

## 2023-09-13 NOTE — ED Notes (Signed)
 Writer rechecked vitals, BP 108/67, pulse 75. Vitals WNL.

## 2023-09-13 NOTE — Group Note (Signed)
 Date:  09/13/2023 Time:  8:53 PM  Group Topic/Focus:  Wrap-Up Group:   The focus of this group is to help patients review their daily goal of treatment and discuss progress on daily workbooks.    Participation Level:  Active  Participation Quality:  Attentive, Intrusive, and Redirectable  Affect:  Excited  Cognitive:  Disorganized  Insight: Good and Limited  Engagement in Group:  Engaged  Modes of Intervention:  Discussion  Additional Comments:   Today is pts first day on the unit. Pt was able to call friends and family.  Pt denied everything   Chris Rogers A Breck [place] 09/13/2023, 8:53 PM

## 2023-09-13 NOTE — ED Notes (Signed)
 Pt is out here, requesting for extra med to help him sleep. Trazodone  100mg  was administered at 21: 10. NP Chiwendu notified.

## 2023-09-13 NOTE — ED Notes (Signed)
Pt currently on phone. No acute distress noted. Will continue to monitor for safety.

## 2023-09-13 NOTE — Plan of Care (Signed)
   Problem: Education: Goal: Knowledge of Chris Rogers General Education information/materials will improve Outcome: Progressing Goal: Emotional status will improve Outcome: Progressing Goal: Mental status will improve Outcome: Progressing Goal: Verbalization of understanding the information provided will improve Outcome: Progressing   Problem: Activity: Goal: Interest or engagement in activities will improve Outcome: Progressing Goal: Sleeping patterns will improve Outcome: Progressing   Problem: Coping: Goal: Ability to verbalize frustrations and anger appropriately will improve Outcome: Progressing Goal: Ability to demonstrate self-control will improve Outcome: Progressing

## 2023-09-13 NOTE — Group Note (Signed)
 Group Topic: Overcoming Obstacles  Group Date: 09/13/2023 Start Time: 0815 End Time: 0847 Facilitators: Lonzell Dwayne RAMAN, NT  Department: [facility]  Number of Participants: 2  Group Focus: relapse prevention Treatment Modality:  Psychoeducation Interventions utilized were clarification Purpose: enhance coping skills  Name: Chris Rogers Date of Birth: May 25, 1975  MR: [id number]    Level of Participation: Patient did not attend group Quality of Participation:  Interactions with others: N/A Mood/Affect: N/A Triggers (if applicable): N/A Cognition: N/A Progress: N/A Response: N/A Plan: N/A  Patients Problems:  Patient Active Problem List   Diagnosis Date Noted   Schizoaffective disorder, mixed type (HCC) 09/11/2023   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

## 2023-09-13 NOTE — ED Notes (Signed)
 Pt is currently sleeping. Q 15 safety checks in place.

## 2023-09-13 NOTE — Tx Team (Signed)
 Initial Treatment Plan 09/13/2023 5:41 PM Chris Rogers FMW:[id number]    PATIENT STRESSORS: Marital or family conflict   Medication change or noncompliance     PATIENT STRENGTHS: Capable of independent living  Work skills    PATIENT IDENTIFIED PROBLEMS: Insomnia  Medication management                    DISCHARGE CRITERIA:  Improved stabilization in mood, thinking, and/or behavior Verbal commitment to aftercare and medication compliance  PRELIMINARY DISCHARGE PLAN: Return to previous living arrangement Return to previous work or school arrangements  PATIENT/FAMILY INVOLVEMENT: This treatment plan has been presented to and reviewed with the patient, Chris Rogers. The patient has been given the opportunity to ask questions and make suggestions.  Huel Mall, RN 09/13/2023, 5:41 PM

## 2023-09-13 NOTE — Group Note (Signed)
 Date:  09/13/2023 Time:  4:42 PM  Group Topic/Focus:  Personal Choices and Values:   The focus of this group is to help patients assess and explore the importance of values in their lives, how their values affect their decisions, how they express their values and what opposes their expression. Recovery Goals:   The focus of this group is to identify appropriate goals for recovery and establish a plan to achieve them.    Participation Level:  Did Not Attend  Participation Quality:    Affect:    Cognitive:    Insight:   Engagement in Group:    Modes of Intervention:    Additional Comments:    Cheyne Boulden 09/13/2023, 4:42 PM

## 2023-09-13 NOTE — ED Notes (Signed)
 Pt is up and can't sleep he keeps asking for something to help him sleep.

## 2023-09-14 MED ORDER — DIPHENHYDRAMINE HCL 50 MG/ML IJ SOLN
50.0000 mg | Freq: Once | INTRAMUSCULAR | Status: AC
  Administered 2023-09-14: 50 mg via INTRAMUSCULAR

## 2023-09-14 MED ORDER — QUETIAPINE FUMARATE 200 MG PO TABS
200.0000 mg | ORAL_TABLET | Freq: Once | ORAL | Status: AC
  Administered 2023-09-14: 200 mg via ORAL
  Filled 2023-09-14: qty 1

## 2023-09-14 MED ORDER — LORAZEPAM 1 MG PO TABS: 1.0000 mg | ORAL_TABLET | Freq: Once | ORAL | Status: AC

## 2023-09-14 MED ORDER — LORAZEPAM 1 MG PO TABS
2.0000 mg | ORAL_TABLET | Freq: Once | ORAL | Status: AC
  Administered 2023-09-14: 2 mg via ORAL
  Filled 2023-09-14: qty 2

## 2023-09-14 MED ORDER — ARIPIPRAZOLE 10 MG PO TABS
10.0000 mg | ORAL_TABLET | Freq: Two times a day (BID) | ORAL | Status: DC
  Administered 2023-09-14 – 2023-09-16 (×5): 10 mg via ORAL
  Filled 2023-09-14 (×5): qty 1

## 2023-09-14 MED ORDER — HALOPERIDOL LACTATE 5 MG/ML IJ SOLN
10.0000 mg | Freq: Once | INTRAMUSCULAR | Status: AC
  Administered 2023-09-14: 10 mg via INTRAMUSCULAR

## 2023-09-14 MED ORDER — LORAZEPAM 2 MG/ML IJ SOLN
1.0000 mg | Freq: Once | INTRAMUSCULAR | Status: AC
  Administered 2023-09-14: 1 mg via INTRAMUSCULAR
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
## 2023-09-14 NOTE — Progress Notes (Signed)
(  Sleep Hours) -  (Any PRNs that were needed, meds refused, or side effects to meds)- Vistaril  25 mg  (Any disturbances and when (visitation, over night)- N/A  (Concerns raised by the patient)- N/A  (SI/HI/AVH)-denies
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
## 2023-09-14 NOTE — Progress Notes (Signed)
 Patient came up to the Nurse stating Call security I need security right now move me from this unit or put me in the chair that's what they have done to me all my life Give me some imodium , give me some vistaril , Patient continues to have disorganized thought process. Patient redirected books were given for Patient to read and went back to his room.. Support ongoing.

## 2023-09-14 NOTE — Group Note (Signed)
 Date:  09/14/2023 Time:  10:53 AM  Group Topic/Focus:  Goals Group:   The focus of this group is to help patients establish daily goals to achieve during treatment and discuss how the patient can incorporate goal setting into their daily lives to aide in recovery. Orientation:   The focus of this group is to educate the patient on the purpose and policies of crisis stabilization and provide a format to answer questions about their admission.  The group details unit policies and expectations of patients while admitted.    Participation Level:  Did Not Attend  Participation Quality:    Affect:    Cognitive:    Insight:   Engagement in Group:    Modes of Intervention:    Additional Comments:    Ellouise Dama Molt 09/14/2023, 10:53 AM

## 2023-09-14 NOTE — Group Note (Signed)
 Date:  09/14/2023 Time:  8:49 PM  Group Topic/Focus:  Wrap-Up Group:   The focus of this group is to help patients review their daily goal of treatment and discuss progress on daily workbooks.    Participation Level:  Active  Participation Quality:  Appropriate  Affect:  Appropriate  Cognitive:  Appropriate  Insight: Appropriate  Engagement in Group:  Engaged  Modes of Intervention:  Education and Exploration  Additional Comments:  Patient attended and participated in group tonight.  He reports that the best part of his day has not happen as yet. He did received rest and got what he needs for tonight.  Gwenn Chillington Dacosta 09/14/2023, 8:49 PM

## 2023-09-14 NOTE — Progress Notes (Signed)
 Patient escorted to [address] without incident. Pt was calm and cooperative, agreeable to room change. Report given to [facility]

## 2023-09-14 NOTE — Plan of Care (Signed)
   Problem: Education: Goal: Emotional status will improve Outcome: Progressing Goal: Mental status will improve Outcome: Progressing   Problem: Activity: Goal: Interest or engagement in activities will improve Outcome: Progressing Goal: Sleeping patterns will improve Outcome: Progressing

## 2023-09-14 NOTE — Progress Notes (Signed)
   09/14/23 0900  Psych Admission Type (Psych Patients Only)  Admission Status Voluntary  Psychosocial Assessment  Patient Complaints Anxiety  Eye Contact Fair  Facial Expression Flat  Affect Appropriate to circumstance  Speech Logical/coherent  Interaction Assertive  Motor Activity Pacing  Appearance/Hygiene In scrubs  Behavior Characteristics Anxious  Mood Preoccupied;Anxious  Thought Process  Coherency WDL  Content WDL  Delusions None reported or observed  Perception WDL  Hallucination None reported or observed  Judgment WDL  Confusion None  Danger to Self  Current suicidal ideation? Denies  Danger to Others  Danger to Others None reported or observed
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
## 2023-09-14 NOTE — Plan of Care (Signed)
  Problem: Education: Goal: Emotional status will improve Outcome: Not Progressing   Problem: Activity: Goal: Interest or engagement in activities will improve Outcome: Not Progressing

## 2023-09-14 NOTE — Progress Notes (Signed)
 Pt received 1 mg ativan , 10 mg Haldol , 50 mg Benadryl  IM per MD order. Administered without hold

## 2023-09-14 NOTE — Plan of Care (Signed)
   Problem: Education: Goal: Emotional status will improve Outcome: Progressing Goal: Mental status will improve Outcome: Progressing Goal: Verbalization of understanding the information provided will improve Outcome: Progressing

## 2023-09-14 NOTE — Progress Notes (Signed)
 Pt complaining of anxiety and diarrhea.  He was given prn medication as ordered.

## 2023-09-15 ENCOUNTER — Encounter (HOSPITAL_COMMUNITY): Payer: Self-pay

## 2023-09-15 MED ORDER — ARIPIPRAZOLE ER 400 MG IM SRER
400.0000 mg | INTRAMUSCULAR | Status: DC
  Administered 2023-09-15: 400 mg via INTRAMUSCULAR

## 2023-09-15 NOTE — [facility] Suicide Risk Assessment (Signed)
 [facility] INPATIENT:  Family/Significant Other Suicide Prevention Education  Suicide Prevention Education:  Contact Attempts: Clarice Heart (mom) [phone], (name of family member/significant other) has been identified by the patient as the family member/significant other with whom the patient will be residing, and identified as the person(s) who will aid the patient in the event of a mental health crisis.  With written consent from the patient, two attempts were made to provide suicide prevention education, prior to and/or following the patient's discharge.  We were unsuccessful in providing suicide prevention education.  A suicide education pamphlet was given to the patient to share with family/significant other.  Date and time of first attempt: 09/15/2023 / 4:11 PM  CSW left a voicemail.     Date and time of second attempt: second attempt needed  Dreyah Montrose O Damone Fancher, LCSWA 09/15/2023, 4:11 PM

## 2023-09-15 NOTE — Progress Notes (Signed)
   09/15/23 2115  Psych Admission Type (Psych Patients Only)  Admission Status Voluntary  Psychosocial Assessment  Patient Complaints Other (Comment) (pt reports ready to discharge)  Eye Contact Fair  Facial Expression Animated  Affect Appropriate to circumstance  Speech Logical/coherent  Interaction Assertive  Motor Activity Slow  Appearance/Hygiene Unremarkable  Behavior Characteristics Cooperative;Appropriate to situation  Mood Pleasant  Thought Process  Coherency WDL  Content WDL  Delusions None reported or observed  Perception WDL  Hallucination None reported or observed  Judgment Poor  Confusion None  Danger to Self  Current suicidal ideation? Denies

## 2023-09-15 NOTE — Group Note (Signed)
 Date:  09/15/2023 Time:  9:02 PM  Group Topic/Focus:  Wrap-Up Group:   The focus of this group is to help patients review their daily goal of treatment and discuss progress on daily workbooks.    Participation Level:  Active  Participation Quality:  Appropriate  Affect:  Appropriate  Cognitive:  Appropriate  Insight: Appropriate  Engagement in Group:  Engaged  Modes of Intervention:  Education  Additional Comments:  Patient attended and participated in group tonight.  He reports that his goal was to sleep. He did get sleep today  Chris Rogers 09/15/2023, 9:02 PM

## 2023-09-15 NOTE — Progress Notes (Signed)
   09/15/23 0740  Psych Admission Type (Psych Patients Only)  Admission Status Voluntary  Psychosocial Assessment  Patient Complaints Anxiety  Eye Contact Fair  Facial Expression Flat  Affect Anxious  Speech Logical/coherent  Interaction Assertive  Motor Activity Restless  Appearance/Hygiene Unremarkable  Behavior Characteristics Fidgety  Mood Anxious  Thought Process  Coherency WDL  Content WDL  Delusions None reported or observed  Perception WDL  Hallucination None reported or observed  Judgment Poor  Confusion None  Danger to Self  Current suicidal ideation? Denies  Agreement Not to Harm Self Yes  Description of Agreement verbal  Danger to Others  Danger to Others None reported or observed

## 2023-09-15 NOTE — Group Note (Signed)
 Recreation Therapy Group Note   Group Topic:Leisure Education  Group Date: 09/15/2023 Start Time: 1019 End Time: 1108 Facilitators: Garo Heidelberg-McCall, LRT,CTRS Location: [address] Dayroom   Group Topic: Leisure Education   Goal Area(s) Addresses:  Patient will successfully demonstrate knowledge of leisure and recreation interests. Patient will successfully identify benefits of leisure participation.  Patient will verbalize appropriate recreation activities to use post discharge.   Behavioral Response: Engaged   Intervention: Guess the Lyric   Activity: LRT facilitated a competitive group game that had patients guess the missing lyric to songs presented. Patients had 6 categories (Pop, Rock, R&B, Dance, Indie and Hip Hop) to choose from. Patient would spin the flicker and whatever category the spinner landed on, the patient would be read a line from that song. If they had the correct answer, they kept the card. If they made the wrong answer, everyone else got a chance to steal the point. The person with the most cards at the end, was the winner.   Education:  Leisure Programme researcher, broadcasting/film/video, Publishing copy Outcome: Acknowledges education   Affect/Mood: Drowsy   Participation Level: Engaged   Participation Quality: Independent   Behavior: Sedated   Speech/Thought Process: Relevant   Insight: Moderate   Judgement: Moderate   Modes of Intervention: Competitive Play   Patient Response to Interventions:  Engaged   Education Outcome:  In group clarification offered    Clinical Observations/Individualized Feedback: Pt was active in the activity but was slurring his speech and fighting to stay awake. Pt was in good spirits and was appropriate. LRT woke pt up when he had his eyes closed and was leaning to the side, he was going to fall out the chair. Pt stated he wouldn't, then proceeded to explain he having trouble going to sleep. Pt also stated he has taken numerous  medications and nothing has worked. Despite that, pt was engaging, appropriate in group and with peers.     Plan: Continue to engage patient in RT group sessions 2-3x/week.   Kamdyn Colborn-McCall, LRT,CTRS 09/15/2023 1:34 PM

## 2023-09-15 NOTE — [facility] Group Notes (Signed)
 Spirituality Group   Description: Participant directed exploration of values, beliefs and meaning   Following a brief framework of chaplain's role and ground rules of group behavior, participants are invited to share concerns or questions that engage spiritual life. Emphasis placed on common themes and shared experiences and ways to make meaning and clarify living into one's values.   Theory/Process/Goal: Utilize the theoretical framework of group therapy established by Celena Kite, Relational Cultural Theory and Rogerian approaches to facilitate relational empathy and use of the "here and now" to foster reflection, self-awareness, and sharing.   Observations: Chris Rogers was an active participant in the group discussion. He offered empathy to a peer and helped to establish trust within the group.  Rigdon Macomber L. Delores HERO.Div
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
## 2023-09-15 NOTE — BH IP Treatment Plan (Signed)
 Interdisciplinary Treatment and Diagnostic Plan Update  09/15/2023 Time of Session: 10:20 AM Chris Rogers MRN: [id number]  Principal Diagnosis: Schizoaffective disorder, bipolar type (HCC)  Secondary Diagnoses: Principal Problem:   Schizoaffective disorder, bipolar type (HCC)   Current Medications:  Current Facility-Administered Medications  Medication Dose Route Frequency Provider Last Rate Last Admin   acetaminophen  (TYLENOL ) tablet 650 mg  650 mg Oral Q6H PRN Tex Drilling, NP   650 mg at 09/15/23 0217   alum & mag hydroxide-simeth (MAALOX/MYLANTA) 200-200-20 MG/5ML suspension 30 mL  30 mL Oral Q4H PRN Tex Drilling, NP   30 mL at 09/15/23 0203   amLODipine  (NORVASC ) tablet 5 mg  5 mg Oral Daily Nkwenti, Doris, NP   5 mg at 09/15/23 0740   ARIPiprazole  ([name] ) tablet 10 mg  10 mg Oral BID Rollene Katz, MD   10 mg at 09/15/23 0740   atenolol  (TENORMIN ) tablet 25 mg  25 mg Oral Daily Nkwenti, Doris, NP   25 mg at 09/15/23 0740   haloperidol  (HALDOL ) tablet 5 mg  5 mg Oral TID PRN Tex Drilling, NP   5 mg at 09/15/23 0100   And   diphenhydrAMINE  (BENADRYL ) capsule 50 mg  50 mg Oral TID PRN Tex Drilling, NP   50 mg at 09/15/23 0100   haloperidol  lactate (HALDOL ) injection 10 mg  10 mg Intramuscular TID PRN Tex Drilling, NP       And   diphenhydrAMINE  (BENADRYL ) injection 50 mg  50 mg Intramuscular TID PRN Tex Drilling, NP       And   LORazepam  (ATIVAN ) injection 2 mg  2 mg Intramuscular TID PRN Tex Drilling, NP       haloperidol  lactate (HALDOL ) injection 5 mg  5 mg Intramuscular TID PRN Tex Drilling, NP   5 mg at 09/15/23 0501   And   diphenhydrAMINE  (BENADRYL ) injection 50 mg  50 mg Intramuscular TID PRN Tex Drilling, NP   50 mg at 09/15/23 0500   And   LORazepam  (ATIVAN ) injection 2 mg  2 mg Intramuscular TID PRN Tex Drilling, NP   2 mg at 09/15/23 0501   haloperidol  (HALDOL ) tablet 5 mg  5 mg Oral BID Tex Drilling, NP   5 mg at 09/15/23 0740   hydrOXYzine   (ATARAX ) tablet 25 mg  25 mg Oral TID PRN Tex Drilling, NP   25 mg at 09/14/23 2029   loperamide  (IMODIUM ) capsule 2 mg  2 mg Oral BID PRN Tex Drilling, NP   2 mg at 09/14/23 1415   magnesium  hydroxide (MILK OF MAGNESIA) suspension 30 mL  30 mL Oral Daily PRN Tex Drilling, NP       mirtazapine  (REMERON ) tablet 7.5 mg  7.5 mg Oral QHS Nkwenti, Doris, NP   7.5 mg at 09/14/23 2030   traZODone  (DESYREL ) tablet 100 mg  100 mg Oral QHS Nkwenti, Doris, NP   100 mg at 09/14/23 2030   PTA Medications: Medications Prior to Admission  Medication Sig Dispense Refill Last Dose/Taking   amLODipine  (NORVASC ) 10 MG tablet Take 10 mg by mouth daily.      benztropine  (COGENTIN ) 0.5 MG tablet Take 1 tablet (0.5 mg total) by mouth 2 (two) times daily. (Patient taking differently: Take 0.5 mg by mouth 2 (two) times daily as needed for tremors.) 60 tablet 3    cloNIDine  (CATAPRES ) 0.1 MG tablet Take 0.1 mg by mouth as needed.      losartan  (COZAAR ) 100 MG tablet Take 100 mg by mouth daily.  traZODone  (DESYREL ) 50 MG tablet Take 1-2 tablets (50-100 mg total) by mouth at bedtime. (Patient taking differently: Take 50-100 mg by mouth at bedtime as needed for sleep.) 60 tablet 3     Patient Stressors: Marital or family conflict   Medication change or noncompliance    Patient Strengths: Capable of independent living  Work skills   Treatment Modalities: Medication Management, Group therapy, Case management,  1 to 1 session with clinician, Psychoeducation, Recreational therapy.   Physician Treatment Plan for Primary Diagnosis: Schizoaffective disorder, bipolar type (HCC) Long Term Goal(s):     Short Term Goals: Ability to identify changes in lifestyle to reduce recurrence of condition will improve Ability to verbalize feelings will improve Ability to demonstrate self-control will improve Ability to identify and develop effective coping behaviors will improve Ability to maintain clinical measurements  within normal limits will improve  Medication Management: Evaluate patient's response, side effects, and tolerance of medication regimen.  Therapeutic Interventions: 1 to 1 sessions, Unit Group sessions and Medication administration.  Evaluation of Outcomes: Not Progressing  Physician Treatment Plan for Secondary Diagnosis: Principal Problem:   Schizoaffective disorder, bipolar type (HCC)  Long Term Goal(s):     Short Term Goals: Ability to identify changes in lifestyle to reduce recurrence of condition will improve Ability to verbalize feelings will improve Ability to demonstrate self-control will improve Ability to identify and develop effective coping behaviors will improve Ability to maintain clinical measurements within normal limits will improve     Medication Management: Evaluate patient's response, side effects, and tolerance of medication regimen.  Therapeutic Interventions: 1 to 1 sessions, Unit Group sessions and Medication administration.  Evaluation of Outcomes: Not Progressing   RN Treatment Plan for Primary Diagnosis: Schizoaffective disorder, bipolar type (HCC) Long Term Goal(s): Knowledge of disease and therapeutic regimen to maintain health will improve  Short Term Goals: Ability to remain free from injury will improve, Ability to verbalize frustration and anger appropriately will improve, Ability to demonstrate self-control, Ability to participate in decision making will improve, Ability to verbalize feelings will improve, Ability to disclose and discuss suicidal ideas, Ability to identify and develop effective coping behaviors will improve, and Compliance with prescribed medications will improve  Medication Management: RN will administer medications as ordered by provider, will assess and evaluate patient's response and provide education to patient for prescribed medication. RN will report any adverse and/or side effects to prescribing provider.  Therapeutic  Interventions: 1 on 1 counseling sessions, Psychoeducation, Medication administration, Evaluate responses to treatment, Monitor vital signs and CBGs as ordered, Perform/monitor CIWA, COWS, AIMS and Fall Risk screenings as ordered, Perform wound care treatments as ordered.  Evaluation of Outcomes: Not Progressing   LCSW Treatment Plan for Primary Diagnosis: Schizoaffective disorder, bipolar type (HCC) Long Term Goal(s): Safe transition to appropriate next level of care at discharge, Engage patient in therapeutic group addressing interpersonal concerns.  Short Term Goals: Engage patient in aftercare planning with referrals and resources, Increase social support, Increase ability to appropriately verbalize feelings, Increase emotional regulation, Facilitate acceptance of mental health diagnosis and concerns, Facilitate patient progression through stages of change regarding substance use diagnoses and concerns, Identify triggers associated with mental health/substance abuse issues, and Increase skills for wellness and recovery  Therapeutic Interventions: Assess for all discharge needs, 1 to 1 time with Social worker, Explore available resources and support systems, Assess for adequacy in community support network, Educate family and significant other(s) on suicide prevention, Complete Psychosocial Assessment, Interpersonal group therapy.  Evaluation of Outcomes:  Not Progressing   Progress in Treatment: Attending groups: Yes. Participating in groups: Yes. Taking medication as prescribed: Yes. Toleration medication: Yes. Family/Significant other contact made: No, will contact:  Clarice Heart (mom) [phone] Patient understands diagnosis: No. Discussing patient identified problems/goals with staff: No. Medical problems stabilized or resolved: Yes. Denies suicidal/homicidal ideation: Yes. Issues/concerns per patient self-inventory: No.  New problem(s) identified:  No  New Short Term/Long Term  Goal(s):    medication stabilization, elimination of SI thoughts, development of comprehensive mental wellness plan.    Patient Goals:  I want to improve my sleep.    Discharge Plan or Barriers:  Patient recently admitted. CSW will continue to follow and assess for appropriate referrals and possible discharge planning.    Reason for Continuation of Hospitalization: Depression Medication stabilization Suicidal ideation  Estimated Length of Stay:  5 - 7 days  Last 3 [place] Suicide Severity Risk Score: Flowsheet Row [facility] from [facility]/20[facility] in [facility] Clinical Support from [facility]03/20[facility] in [facility]  C-SSRS RISK CATEGORY No Risk High Risk Low Risk    Last PHQ 2/9 Scores:    09/12/2023    1:14 PM 08/03/2023    3:11 PM 02/21/2023    9:13 AM  Depression screen PHQ 2/9  Decreased Interest 1 0 0  Down, Depressed, Hopeless 2 2 0  PHQ - 2 Score 3 2 0  Altered sleeping 2 0 2  Tired, decreased energy 2 0 3  Change in appetite 0 0 3  Feeling bad or failure about yourself  1 1 0  Trouble concentrating 2 0 0  Moving slowly or fidgety/restless 0 0 3  Suicidal thoughts 1 0 0  PHQ-9 Score 11 3 11   Difficult doing work/chores  Not difficult at all Not difficult at all    Scribe for Treatment Team: Nyala Kirchner O Jazmen Lindenbaum, LCSWA 09/15/2023 12:13 PM
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
## 2023-09-16 DIAGNOSIS — F250 Schizoaffective disorder, bipolar type: Principal | ICD-10-CM

## 2023-09-16 MED ORDER — ARIPIPRAZOLE ER 400 MG IM SRER: 400.0000 mg | INTRAMUSCULAR | 0 refills | Status: DC

## 2023-09-16 MED ORDER — MIRTAZAPINE 7.5 MG PO TABS: 7.5000 mg | ORAL_TABLET | Freq: Every day | ORAL | 0 refills | Status: DC

## 2023-09-16 MED ORDER — TRAZODONE HCL 100 MG PO TABS: 100.0000 mg | ORAL_TABLET | Freq: Every evening | ORAL | 0 refills | Status: DC | PRN

## 2023-09-16 MED ORDER — HALOPERIDOL DECANOATE 100 MG/ML IM SOLN: 100.0000 mg | INTRAMUSCULAR | Status: DC

## 2023-09-16 MED ORDER — HYDROXYZINE HCL 25 MG PO TABS: 25.0000 mg | ORAL_TABLET | Freq: Three times a day (TID) | ORAL | 0 refills | Status: DC | PRN

## 2023-09-16 MED ORDER — ARIPIPRAZOLE 10 MG PO TABS: 10.0000 mg | ORAL_TABLET | Freq: Two times a day (BID) | ORAL | 0 refills | Status: DC

## 2023-09-16 MED ORDER — ATENOLOL 25 MG PO TABS: 25.0000 mg | ORAL_TABLET | Freq: Every day | ORAL | 0 refills | Status: DC

## 2023-09-16 MED ORDER — AMLODIPINE BESYLATE 5 MG PO TABS: 5.0000 mg | ORAL_TABLET | Freq: Every day | ORAL | 0 refills | Status: DC

## 2023-09-16 NOTE — Plan of Care (Signed)
   Problem: Education: Goal: Emotional status will improve Outcome: Progressing Goal: Mental status will improve Outcome: Progressing   Problem: Activity: Goal: Interest or engagement in activities will improve Outcome: Progressing

## 2023-09-16 NOTE — Plan of Care (Signed)
  Problem: Education: Goal: Knowledge of [place] [facility] Education information/materials will improve Outcome: Progressing Goal: Emotional status will improve Outcome: Progressing Goal: Mental status will improve Outcome: Progressing   Problem: Activity: Goal: Interest or engagement in activities will improve Outcome: Progressing   Problem: Safety: Goal: Periods of time without injury will increase Outcome: Progressing

## 2023-09-16 NOTE — Progress Notes (Signed)
(  Sleep Hours) - 7.25 (Any PRNs that were needed, meds refused, or side effects to meds)- imodium  (Any disturbances and when (visitation, over night)- none (Concerns raised by the patient)- none (SI/HI/AVH)- denies all

## 2023-09-16 NOTE — Progress Notes (Signed)
 Patient ambulatory, alert and oriented x 4. Education and support provided. Discharged summary/ AVS, medication next dose and follow up appointments reviewed with patient and mother, copy of suicide safety plan completed, reviewed with patient and placed in chart. Suicide prevention resources also provided. Patient belongings in locker returned and belongings sheet signed. Patient denies SI/HI. Patient appears in good mood, laughing and smiling with staff and peers. Patient denies any concerns at this time. Patient discharge to lobby with his mother.

## 2023-09-16 NOTE — [facility] Suicide Risk Assessment (Signed)
 Suicide Risk Assessment  Discharge Assessment    [facility] Discharge Suicide Risk Assessment   Principal Problem: Schizoaffective disorder, bipolar type [facility]) Discharge Diagnoses: Principal Problem:   Schizoaffective disorder, bipolar type (HCC)  During the patient's hospitalization, patient had extensive initial psychiatric evaluation, and follow-up psychiatric evaluations every day.  Psychiatric diagnoses provided upon initial assessment: Schizoaffective disorder, bipolar type, current episode mixed   Patient's psychiatric medications were adjusted on admission: - Continue [name]  10 mg BID for acute mania and behavioral disinhibition. - Start [name]  Maintena 400 mg q28 days (given 9/12, next due 10/10). - Continue Haldol  5 mg BID for acute mania.  - Continue Haldol  decanoate 100 mg q28 days, last given 9/9, next due 10/7.   During the hospitalization, other adjustments were made to the patient's psychiatric medication regimen:  -- Haldol  decanoate given 9/9, next due 10/7 00  Gradually, patient started adjusting to milieu.   Patient's care was discussed during the interdisciplinary team meeting every day during the hospitalization.  The patient denied having side effects to prescribed psychiatric medication.  The patient reports their target psychiatric symptoms of insomnia, irritability, anger, hallucinations responded well to the psychiatric medications, and the patient reports overall benefit other psychiatric hospitalization. Supportive psychotherapy was provided to the patient. The patient also participated in regular group therapy while admitted.   Labs were reviewed with the patient, and abnormal results were discussed with the patient.  The patient denied having suicidal thoughts more than 48 hours prior to discharge.  Patient denies having homicidal thoughts.  Patient denies having auditory hallucinations.  Patient denies any visual hallucinations.  Patient denies having  paranoid thoughts.  The patient is able to verbalize their individual safety plan to this provider.  It is recommended to the patient to continue psychiatric medications as prescribed, after discharge from the hospital.    It is recommended to the patient to follow up with your outpatient psychiatric provider and PCP.  Discussed with the patient, the impact of alcohol, drugs, tobacco have been there overall psychiatric and medical wellbeing, and total abstinence from substance use was recommended the patient.  Total Time spent with patient: 20 minutes  Musculoskeletal: Strength & Muscle Tone: within normal limits Gait & Station: normal Patient leans: N/A  Psychiatric Specialty Exam  Presentation  General Appearance:  Appropriate for Environment  Eye Contact: Good  Speech: Clear and Coherent; Normal Rate  Speech Volume: Normal  Handedness: Right   Mood and Affect  Mood: Euthymic (more euthymic today)  Duration of Depression Symptoms: Greater than two weeks  Affect: Appropriate   Thought Process  Thought Processes: Linear  Descriptions of Associations:Intact  Orientation:Full (Time, Place and Person)  Thought Content:Logical  History of Schizophrenia/Schizoaffective disorder:No  Duration of Psychotic Symptoms:No data recorded Hallucinations:Hallucinations: None  Ideas of Reference:None  Suicidal Thoughts:Suicidal Thoughts: No  Homicidal Thoughts:Homicidal Thoughts: No   Sensorium  Memory: Immediate Fair  Judgment: Fair  Insight: Fair   Art therapist  Concentration: Fair  Attention Span: Fair  Recall: [employer] of Knowledge: Fair  Language: Fair   Psychomotor Activity  Psychomotor Activity: Psychomotor Activity: Normal   Assets  Assets: Communication Skills; Desire for Improvement; Housing; Resilience   Sleep  Sleep: Sleep: Poor  Estimated Sleeping Duration (Last 24 Hours): 5.25-7.00 hours  Physical  Exam: Physical Exam Vitals reviewed.  Constitutional:      Appearance: He is obese.  Pulmonary:     Effort: Pulmonary effort is normal.  Musculoskeletal:  General: Normal range of motion.  Skin:    General: Skin is warm and dry.  Neurological:     Mental Status: He is alert and oriented to person, place, and time.  Psychiatric:        Attention and Perception: Attention and perception normal.        Mood and Affect: Mood normal.        Speech: Speech normal.        Behavior: Behavior normal. Behavior is cooperative.        Thought Content: Thought content normal. Thought content does not include homicidal or suicidal ideation.        Cognition and Memory: Memory normal. Cognition is impaired.        Judgment: Judgment is impulsive.    Review of Systems  Constitutional:  Negative for chills, diaphoresis, fever, malaise/fatigue and weight loss.  Respiratory:  Negative for cough.   Cardiovascular:  Negative for chest pain.  Gastrointestinal:  Negative for abdominal pain, constipation, diarrhea, nausea and vomiting.  Genitourinary:  Negative for urgency.   Blood pressure (!) 151/85, pulse 86, temperature 98.3 F (36.8 C), temperature source Oral, resp. rate 18, height 5' 7 (1.702 m), weight 122.5 kg, SpO2 98%. Body mass index is 42.29 kg/m.  Mental Status Per Nursing Assessment::   On Admission:  NA  Demographic Factors:  Male and Low socioeconomic status  Loss Factors: NA  Historical Factors: Prior suicide attempts and Impulsivity  Risk Reduction Factors:   Sense of responsibility to family, Religious beliefs about death, Living with another person, especially a relative, and Positive social support  Continued Clinical Symptoms:  Bipolar Disorder:   Mixed State Schizophrenia:   Paranoid or undifferentiated type Medical Diagnoses and Treatments/Surgeries  Cognitive Features That Contribute To Risk:  None    Suicide Risk:  Mild:  Suicidal ideation of  limited frequency, intensity, duration, and specificity.  There are no identifiable plans, no associated intent, mild dysphoria and related symptoms, good self-control (both objective and subjective assessment), few other risk factors, and identifiable protective factors, including available and accessible social support.   Follow-up Information     [facility] Follow up on 09/25/2023.   Why: You have a hospital follow up appointment for therapy and medication management services on 9/22 at 9:30 am.  This will be a Virtual, telehealth appt. Contact information: [address]  [address] [place] [address] [phone]                 Plan Of Care/Follow-up recommendations:  Activity: as tolerated  Diet: heart healthy  Other: -Follow-up with your outpatient psychiatric provider -instructions on appointment date, time, and address (location) are provided to you in discharge paperwork.  -Take your psychiatric medications as prescribed at discharge - instructions are provided to you in the discharge paperwork  -Follow-up with outpatient primary care doctor and other specialists -for management of chronic medical disease, including: hypertension  -Testing: Follow-up with outpatient provider for abnormal lab results:  Elevated hemoglobin A1c (indicating possible diabetes)  -Recommend abstinence from alcohol, tobacco, and other illicit drug use at discharge.   -If your psychiatric symptoms recur, worsen, or if you have side effects to your psychiatric medications, call your outpatient psychiatric provider, 911, [phone] or go to the nearest emergency department.  -If suicidal thoughts recur, call your outpatient psychiatric provider, 911, [phone] or go to the nearest emergency department.   Lynwood Morene Lavone Delsie, MD 09/16/2023, 10:11 AM

## 2023-09-16 NOTE — Progress Notes (Signed)
  [facility] Adult Case Management Discharge Plan :  Will you be returning to the same living situation after discharge:  No. Pt will be [place] with his mom [place]  At discharge, do you have transportation home?: Yes,  pts mom is coming to pick the patient up Do you have the ability to pay for your medications: Yes,  PRIMARY INS: [employer] MEDICARE / [facility] MEDICARE  Release of information consent forms completed and in the chart;  Patient's signature needed at discharge.  Patient to Follow up at:  Follow-up Information     [facility] Follow up on 09/25/2023.   Why: You have a hospital follow up appointment for therapy and medication management services on 9/22 at 9:30 am.  This will be a Virtual, telehealth appt. Contact information: [address]  [address] [place] [address] [phone]                 Next level of care provider has access to [facility] Link:no  Safety Planning and Suicide Prevention discussed: Yes,  completed with (father) Lynwood Pack [phone]  Has patient been referred to the Quitline?: Patient refused referral for treatment pt denies  Patient has been referred for addiction treatment: No known substance use disorder.  Golda Louder, LCSWA 09/16/2023, 10:09 AM

## 2023-09-16 NOTE — Discharge Summary (Signed)
 Physician Discharge Summary Note  Patient:  Chris Rogers is an 48 y.o., male MRN:  [id number] DOB:  1975-12-13 Patient phone:  [phone] (home)  Patient address:   [address] [place] [address],  Total Time spent with patient: 20 minutes  Date of Admission:  09/13/2023 Date of Discharge: 09/16/2023  Reason for Admission:  Schizoaffective disorder, decompensated mania.  Principal Problem: Schizoaffective disorder, bipolar type [facility]) Discharge Diagnoses: Principal Problem:   Schizoaffective disorder, bipolar type [facility])   Past Psychiatric History:  Past Psychiatric History:  Current psychiatrist: None, would like to be set up in [place]. Current therapist: Denies Previous psychiatric diagnoses: Schizoaffective disorder, bipolar type; Bipolar I disorder, Schizophrenia Current psychiatric medications: [name]  10 mg  Psychiatric medication history/compliance: compliant, previously trialed numerous psychiatric medications including invega, olanzapine , topamax.  Psychiatric hospitalization(s): Multiple previous ED visits during manic episodes. Patient admitted to [facility] 1/2-1/14 for manic episode with psychosis. Needed restraints during that admission, punching windows in the nurses station, banging on walls.  Psychotherapy history: Unknown Neuromodulation history: Unknown History of suicide (obtained from HPI): None immediately apparent on chart review History of homicide or aggression (obtained in HPI): Patient has persistently made homicidal threats when manic, presented 12/22 to [facility] while manic, needed to be transferred to the ED for aggression and violent behavior.   Substance Abuse History: Alcohol: Denies Tobacco: Denies Cannabis: Denies IV drug use: Denies Prescription drug use: Denies Other illicit drugs: Denies Rehab history: Denies   Past Medical History: PCP: Patient reports he has PCP Medical diagnoses:  mycosis fungoides, hypertension.   Medications: Amlodipine  10  Allergies: Invega manic episode, olanzapine  mania topamax glaucoma symptoms Hospitalizations: Unclear Surgeries: Did not ask Trauma: Unknown Seizures: Unknown   Social History: Living situation: Living in [place] Education: Naval architect Occupational history: [employer] Marital status: Single Children: Three children, one deceased  Legal: Unclear (may have charges pending depending on outcome of fight with SO) Military: None   Access to firearms: Denies   Family Psychiatric History:   Unknown   Past Medical History:  Past Medical History:  Diagnosis Date   Bipolar 1 disorder (HCC)    Diabetes mellitus, type II (HCC)    Hypertension     Past Surgical History:  Procedure Laterality Date   KNEE SURGERY     Family History:  Family History  Problem Relation Age of Onset   Hypertension Mother    Hyperlipidemia Mother    Social History:  Social History   Substance and Sexual Activity  Alcohol Use Not Currently     Social History   Substance and Sexual Activity  Drug Use Not Currently    Social History   Socioeconomic History   Marital status: Divorced    Spouse name: Not on file   Number of children: 1   Years of education: Not on file   Highest education level: Not on file  Occupational History   Not on file  Tobacco Use   Smoking status: Never   Smokeless tobacco: Never  Vaping Use   Vaping status: Never Used  Substance and Sexual Activity   Alcohol use: Not Currently   Drug use: Not Currently   Sexual activity: Yes    Partners: Female    Birth control/protection: Condom  Other Topics Concern   Not on file  Social History Narrative   Not on file   Social Drivers of Health   Financial Resource Strain: Not on file  Food Insecurity: No Food Insecurity (09/13/2023)  Hunger Vital Sign    Worried About Running Out of Food in the Last Year: Never true    Ran Out of Food in the Last Year: Never true  Transportation  Needs: No Transportation Needs (09/13/2023)   PRAPARE - Administrator, Civil Service (Medical): No    Lack of Transportation (Non-Medical): No  Physical Activity: Not on file  Stress: Not on file  Social Connections: Unknown (05/10/2021)   Received from [facility]   Social Network    Social Network: Not on file    Hospital Course:  During the patient's hospitalization, patient had extensive initial psychiatric evaluation, and follow-up psychiatric evaluations every day.   Psychiatric diagnoses provided upon initial assessment: Schizoaffective disorder, bipolar type, current episode mixed    Patient's psychiatric medications were adjusted on admission: - Continue [name]  10 mg BID for acute mania and behavioral disinhibition. - Start [name]  Maintena 400 mg q28 days (given 9/12, next due 10/10). - Continue Haldol  5 mg BID for acute mania.  - Continue Haldol  decanoate 100 mg q28 days, last given 9/9, next due 10/7.    During the hospitalization, other adjustments were made to the patient's psychiatric medication regimen:  -- Haldol  decanoate given 9/9, next due 10/7 00   Gradually, patient started adjusting to milieu.   Patient's care was discussed during the interdisciplinary team meeting every day during the hospitalization.   The patient denied having side effects to prescribed psychiatric medication.   The patient reports their target psychiatric symptoms of insomnia, irritability, anger, hallucinations responded well to the psychiatric medications, and the patient reports overall benefit other psychiatric hospitalization. Supportive psychotherapy was provided to the patient. The patient also participated in regular group therapy while admitted.    Labs were reviewed with the patient, and abnormal results were discussed with the patient.   The patient denied having suicidal thoughts more than 48 hours prior to discharge.  Patient denies having homicidal thoughts.   Patient denies having auditory hallucinations.  Patient denies any visual hallucinations.  Patient denies having paranoid thoughts.   The patient is able to verbalize their individual safety plan to this provider.   It is recommended to the patient to continue psychiatric medications as prescribed, after discharge from the hospital.     It is recommended to the patient to follow up with your outpatient psychiatric provider and PCP.   Discussed with the patient, the impact of alcohol, drugs, tobacco have been there overall psychiatric and medical wellbeing, and total abstinence from substance use was recommended the patient.    Physical Findings: AIMS:  , ,  ,  ,  ,  ,   CIWA:    COWS:     Musculoskeletal: Strength & Muscle Tone: within normal limits Gait & Station: normal Patient leans: N/A   Psychiatric Specialty Exam:  Presentation  General Appearance:  Appropriate for Environment  Eye Contact: Good  Speech: Clear and Coherent; Normal Rate  Speech Volume: Normal  Handedness: Right   Mood and Affect  Mood: Euthymic  Affect: Appropriate   Thought Process  Thought Processes: Coherent; Linear  Descriptions of Associations:Intact  Orientation:Full (Time, Place and Person)  Thought Content:WDL  History of Schizophrenia/Schizoaffective disorder:No  Duration of Psychotic Symptoms:No data recorded Hallucinations:Hallucinations: None  Ideas of Reference:None  Suicidal Thoughts:Suicidal Thoughts: No  Homicidal Thoughts:Homicidal Thoughts: No   Sensorium  Memory: Immediate Fair; Recent Fair; Remote Good  Judgment: Good  Insight: Fair   Executive Functions  Concentration: Good  Attention Span: Good  Recall: Good  Fund of Knowledge: Fair  Language: Good   Psychomotor Activity  Psychomotor Activity: Psychomotor Activity: Normal   Assets  Assets: Communication Skills; Desire for Improvement; Housing; Social Support   Sleep   Sleep: Sleep: Good  Estimated Sleeping Duration (Last 24 Hours): 5.25-7.00 hours   Physical Exam: Physical Exam Vitals reviewed.  HENT:     Head: Normocephalic.  Skin:    General: Skin is warm and dry.     Coloration: Skin is pale.  Neurological:     Mental Status: He is alert and oriented to person, place, and time.  Psychiatric:        Behavior: Behavior normal.        Thought Content: Thought content normal.        Judgment: Judgment normal.    Review of Systems  Gastrointestinal:  Negative for constipation, diarrhea and nausea.  Musculoskeletal:  Negative for back pain and myalgias.  Skin:  Positive for itching.  Neurological:  Negative for headaches.  Psychiatric/Behavioral:  Negative for depression, hallucinations and suicidal ideas. The patient is not nervous/anxious and does not have insomnia.    Blood pressure (!) 151/85, pulse 86, temperature 98.3 F (36.8 C), temperature source Oral, resp. rate 18, height 5' 7 (1.702 m), weight 122.5 kg, SpO2 98%. Body mass index is 42.29 kg/m.   Social History   Tobacco Use  Smoking Status Never  Smokeless Tobacco Never   Tobacco Cessation:  N/A, patient does not currently use tobacco products   Blood Alcohol level:  Lab Results  Component Value Date   ETH <10 06/29/2021   ETH <10 12/17/2020    Metabolic Disorder Labs:  Lab Results  Component Value Date   HGBA1C 6.5 (H) 09/11/2023   MPG 139.85 09/11/2023   MPG 134 06/29/2021   No results found for: PROLACTIN Lab Results  Component Value Date   CHOL 207 (H) 09/11/2023   TRIG 46 09/11/2023   HDL 60 09/11/2023   CHOLHDL 3.5 09/11/2023   VLDL 9 09/11/2023   LDLCALC 138 (H) 09/11/2023   LDLCALC 142 (H) 06/29/2021    See Psychiatric Specialty Exam and Suicide Risk Assessment completed by Attending Physician prior to discharge.  Discharge destination:  Home  Is patient on multiple antipsychotic therapies at discharge:  Yes,   Do you recommend  tapering to monotherapy for antipsychotics?  No   Has Patient had three or more failed trials of antipsychotic monotherapy by history: Yes: Haloperidol , ziprasidone , aripiprazole , olanzapine , quetiapine   Recommended Plan for Multiple Antipsychotic Therapies: Additional reason(s) for multiple antispychotic treatment:  Patient is struggled with medication compliance.  Dual long-acting injectables prescribed  Discharge Instructions     Discharge instructions   Complete by: As directed    Closing notes from your doctor: It was a pleasure taking care of you while you were with us . I want to stress once more that medications are part of, but not ALL of what we must do to take care of ourselves mentally.   We all need: - Physical activity - at least 3, but preferably 5 days per week where we exercise for 30 minutes at a level where we get out of breath. That will vary by person and health conditions, but it will improve your mental health as well as your physical health. - Good restful sleep - avoid using a phone or screens in bed, sleep in a dark room, use a white noise machine or app and if you  are having trouble sleeping, consult your doctor. - To reduce use of substances - alcohol and tobacco are shown to be harmful to many parts of our health, if you need help, ask for it. The same is true for illegal drugs, if you need help, ask for it. - To find purpose - whether it is caring for loved ones, volunteering, pursuing hobbies, or doing work that means something to you, try and live in accordance with your values. - Healthy relationships - seek healthy relationships where the other person helps you grow and challenges you to be your best. If it does not feel good, seek assistance.  JINNY Morene GORMAN Delsie, MD [facility] Psychiatry Resident _ Outpatient Therapy and Psychiatry Resources for Patients: Your psychiatric needs would be well-served by consultation and regular meetings with an outpatient  therapist to assist you with your mood-related conditions. Here are a series of links for finding a therapist.  [image]  Includes links to the following: [facility] (http://wilson-mayo.com/) (only for [facility] and please reserve for uninsured) [facility]  (http://blankenship-martinez.net/) Psychology Today Special educational needs teacher (https://www.psychologytoday.com/us lendell) Psychology Today Support Group Tax inspector (https://www.psychologytoday.com/us /groups/) [employer] - [employer] Location (https://carolinabehavioralcare.com/staff-location/[place]/) [facility] [place] - Support Group Finder - (RecordDebt.fi) [facility] the [employer] - Lexicographer (https://fspcares.org/contact/) [employer] for Mental Health [place] - NAMI (https://namiguilford.org/support-and-education/support-groups/) Interior and spatial designer Health - Affiliated with [facility] (https://www.[place].com/lb/locations/profile/[facility]-[address]/) Dept of Health and Human Services - Find a mental health facility (http://lester.info/) __ Learning about therapy: The term therapy can mean many things. There are many different types of psychotherapy, some are short-term, others take a longer period of time. A good therapist will meet with you, discuss your goals, and help you set a treatment plan that addresses your major concerns, regardless of what type of therapy they practice. If the first therapist is not a great fit, try another one.   The American Psychological Association (the APA) has resources to learn about different kinds of therapy.  Here is a link to learn more about the different kinds of  psychotherapy: [image]  [place].si      Allergies as of 09/16/2023       Reactions   Invega [paliperidone] Other (See Comments)   manic episode   Olanzapine  Other (See Comments)   Mania   Topamax [topiramate] Other (See Comments)   Patient states glaucoma like symptoms and blindness        Medication List     STOP taking these medications    benztropine  0.5 MG tablet Commonly known as: COGENTIN    cloNIDine  0.1 MG tablet Commonly known as: CATAPRES    losartan  100 MG tablet Commonly known as: COZAAR        TAKE these medications      Indication  amLODipine  5 MG tablet Commonly known as: NORVASC  Take 1 tablet (5 mg total) by mouth daily. Start taking on: September 17, 2023 What changed:  medication strength how much to take  Indication: High Blood Pressure   ARIPiprazole  10 MG tablet Commonly known as: [name]  Take 1 tablet (10 mg total) by mouth 2 (two) times daily.  Indication: Manic Phase of Manic-Depression, MIXED BIPOLAR AFFECTIVE DISORDER   ARIPiprazole  ER 400 MG Srer injection Commonly known as: [name]  MAINTENA Inject 2 mLs (400 mg total) into the muscle every 28 (twenty-eight) days for 5 doses. Start taking on: October 13, 2023  Indication: Manic-Depression   atenolol  25 MG tablet Commonly known as: TENORMIN  Take  1 tablet (25 mg total) by mouth daily. Start taking on: September 17, 2023  Indication: High Blood Pressure   hydrOXYzine  25 MG tablet Commonly known as: ATARAX  Take 1 tablet (25 mg total) by mouth 3 (three) times daily as needed for anxiety.  Indication: Feeling Anxious, Feeling Tense, State of Being Sedated   mirtazapine  7.5 MG tablet Commonly known as: REMERON  Take 1 tablet (7.5 mg total) by mouth at bedtime.  Indication: Major Depressive Disorder, sleep   traZODone  100 MG tablet Commonly known as: DESYREL  Take 1 tablet (100 mg total) by mouth at bedtime as needed for  sleep. What changed:  medication strength how much to take when to take this reasons to take this  Indication: Trouble Sleeping        Follow-up Information     [facility] Follow up on 09/25/2023.   Why: You have a hospital follow up appointment for therapy and medication management services on 9/22 at 9:30 am.  This will be a Virtual, telehealth appt. Contact information: [address]  [address] [place] [address] [phone]                 Follow-up recommendations:  Activity: as tolerated   Diet: heart healthy   Other: -Follow-up with your outpatient psychiatric provider -instructions on appointment date, time, and address (location) are provided to you in discharge paperwork.   -Take your psychiatric medications as prescribed at discharge - instructions are provided to you in the discharge paperwork   -Follow-up with outpatient primary care doctor and other specialists -for management of chronic medical disease, including: hypertension   -Testing: Follow-up with outpatient provider for abnormal lab results:  Elevated hemoglobin A1c (indicating possible diabetes)   -Recommend abstinence from alcohol, tobacco, and other illicit drug use at discharge.    -If your psychiatric symptoms recur, worsen, or if you have side effects to your psychiatric medications, call your outpatient psychiatric provider, 911, [phone] or go to the nearest emergency department.   -If suicidal thoughts recur, call your outpatient psychiatric provider, 911, [phone] or go to the nearest emergency department.  Signed: Lynwood Morene Lavone Delsie, MD 09/16/2023, 11:16 AM

## 2023-09-16 NOTE — [facility] Suicide Risk Assessment (Signed)
 [facility] INPATIENT:  Family/Significant Other Suicide Prevention Education  Suicide Prevention Education:  Education Completed; [place], Heart [phone]),  (name of family member/significant other) has been identified by the patient as the family member/significant other with whom the patient will be residing, and identified as the person(s) who will aid the patient in the event of a mental health crisis (suicidal ideations/suicide attempt).  With written consent from the patient, the family member/significant other has been provided the following suicide prevention education, prior to the and/or following the discharge of the patient.  The suicide prevention education provided includes the following: Suicide risk factors Suicide prevention and interventions National Suicide Hotline telephone number [facility] assessment telephone number [facility] Emergency Assistance 911 [facility] and/or Residential Mobile Crisis Unit telephone number  Request made of family/significant other to: Remove weapons (e.g., guns, rifles, knives), all items previously/currently identified as safety concern.   Remove drugs/medications (over-the-counter, prescriptions, illicit drugs), all items previously/currently identified as a safety concern.  The family member/significant other verbalizes understanding of the suicide prevention education information provided.  The family member/significant other agrees to remove the items of safety concern listed above.  Golda Louder LCSWA 09/16/2023, 9:51 AM

## 2023-09-25 ENCOUNTER — Ambulatory Visit (HOSPITAL_COMMUNITY)
Admission: EM | Admit: 2023-09-25 | Discharge: 2023-09-25 | Disposition: A | Discharge status: Home / Self Care | Attending: Psychiatry | Admitting: Psychiatry

## 2023-09-25 DIAGNOSIS — F329 Major depressive disorder, single episode, unspecified: Secondary | ICD-10-CM | POA: Diagnosis not present

## 2023-09-25 DIAGNOSIS — F259 Schizoaffective disorder, unspecified: Secondary | ICD-10-CM | POA: Diagnosis not present
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
## 2023-09-25 NOTE — Discharge Instructions (Addendum)

## 2023-10-17 ENCOUNTER — Ambulatory Visit (HOSPITAL_COMMUNITY): Admitting: Registered Nurse

## 2024-01-01 NOTE — Progress Notes (Signed)
.  Declined SDOH

## 2024-01-12 NOTE — Progress Notes (Signed)
 The patient attended a screening event on 11/20/2023 where his screening results were a BP of  169/99 at second check and a blood glucose of 100. At the event the patient noted Dr. Parke as his PCP, Medicare for insurance, and he is not a smoker.   Per chart review the patient does not have a PCP listed, has [facility] Medicare for insurance, and does not have any SDOH needs. Chart review indicates the patient has not had a PCP visit within the last 12 months but has had several urgent care and behavioral health visits.  CHW attempted to reach patient for initial f/u but was unable to make contact with the patient. Letter sent with [employer] and [facility] Primary care clinic PCP resource flyers, [place] and [employer] and [facility] financial assistance information flyers . In case needed by pt.An additional follow up will be done in according to the health equity team's protocol.
